# Patient Record
Sex: Female | Born: 1995 | Race: Black or African American | Hispanic: No | Marital: Single | Smoking: Current some day smoker
Health system: Southern US, Community
[De-identification: ages and names within clinical notes are randomized; demographics above are authoritative.]

## PROBLEM LIST (undated history)

## (undated) ENCOUNTER — Inpatient Hospital Stay (HOSPITAL_COMMUNITY): Payer: Self-pay

## (undated) DIAGNOSIS — F32A Depression, unspecified: Secondary | ICD-10-CM

## (undated) DIAGNOSIS — E669 Obesity, unspecified: Secondary | ICD-10-CM

## (undated) DIAGNOSIS — I10 Essential (primary) hypertension: Secondary | ICD-10-CM

## (undated) DIAGNOSIS — Z8719 Personal history of other diseases of the digestive system: Secondary | ICD-10-CM

## (undated) DIAGNOSIS — J45909 Unspecified asthma, uncomplicated: Secondary | ICD-10-CM

## (undated) DIAGNOSIS — Z9889 Other specified postprocedural states: Secondary | ICD-10-CM

## (undated) DIAGNOSIS — K429 Umbilical hernia without obstruction or gangrene: Secondary | ICD-10-CM

## (undated) DIAGNOSIS — F329 Major depressive disorder, single episode, unspecified: Secondary | ICD-10-CM

## (undated) DIAGNOSIS — A539 Syphilis, unspecified: Secondary | ICD-10-CM

## (undated) HISTORY — DX: Unspecified asthma, uncomplicated: J45.909

## (undated) HISTORY — PX: UMBILICAL HERNIA REPAIR: SHX196

## (undated) HISTORY — DX: Personal history of other diseases of the digestive system: Z87.19

## (undated) HISTORY — PX: UMBILICAL HERNIA REPAIR: SUR1181

## (undated) HISTORY — DX: Other specified postprocedural states: Z98.890

---
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
## 2013-01-03 ENCOUNTER — Other Ambulatory Visit: Payer: Self-pay

## 2013-11-06 ENCOUNTER — Encounter (HOSPITAL_COMMUNITY): Payer: Self-pay | Admitting: Emergency Medicine

## 2013-11-06 ENCOUNTER — Emergency Department (HOSPITAL_COMMUNITY)
Admission: EM | Admit: 2013-11-06 | Discharge: 2013-11-06 | Disposition: A | Discharge status: Home / Self Care | Payer: Self-pay | Attending: Emergency Medicine | Admitting: Emergency Medicine

## 2013-11-06 DIAGNOSIS — Z113 Encounter for screening for infections with a predominantly sexual mode of transmission: Secondary | ICD-10-CM | POA: Insufficient documentation

## 2013-11-06 DIAGNOSIS — I10 Essential (primary) hypertension: Secondary | ICD-10-CM | POA: Insufficient documentation

## 2013-11-06 DIAGNOSIS — F17200 Nicotine dependence, unspecified, uncomplicated: Secondary | ICD-10-CM | POA: Insufficient documentation

## 2013-11-06 DIAGNOSIS — Z8719 Personal history of other diseases of the digestive system: Secondary | ICD-10-CM | POA: Insufficient documentation

## 2013-11-06 DIAGNOSIS — Z3202 Encounter for pregnancy test, result negative: Secondary | ICD-10-CM | POA: Insufficient documentation

## 2013-11-06 DIAGNOSIS — J45909 Unspecified asthma, uncomplicated: Secondary | ICD-10-CM | POA: Insufficient documentation

## 2013-11-06 LAB — POC URINE PREG, ED: Preg Test, Ur: NEGATIVE

## 2013-11-06 NOTE — ED Notes (Signed)
Pt here requesting STD check and pregnancy test; pt sts LMP was in August and had some spotting several days ago; pt denies obvious complaint

## 2013-11-06 NOTE — ED Provider Notes (Signed)
CSN: [id number]     Arrival date & time 11/06/13  1020 History   First MD Initiated Contact with Patient 11/06/13 1200     Chief Complaint  Patient presents with  . SEXUALLY TRANSMITTED DISEASE     (Consider location/radiation/quality/duration/timing/severity/associated sxs/prior Treatment) HPI  Patient to the ER for STD check and pregnancy test. She is here with her mother and reports having sexual intercourse last night between 10-11 pm. She denies having any symptoms or pain. She then changes her mind when told she would need a pelvic to get STD check and decides that she would just like a pregnancy test done. Asymptomatic  Past Medical History  Diagnosis Date  . Hypertension     on meds  . Hernia, umbilical   . Hypertension   . H/O umbilical hernia repair   . Asthma     no current meds needed   Past Surgical History  Procedure Laterality Date  . Umbilical hernia repair      as a child  . Umbilical hernia repair    . Cesarean section N/A 05/19/2012    Procedure:  Primary CESAREAN SECTION  of baby girl  at 1933  APGAR 4/5/5;  Surgeon: Adam Phenix, MD;  Location: [facility];  Service: Obstetrics;  Laterality: N/A;   Family History  Problem Relation Age of Onset  . Hypertension Mother   . Anemia Mother   . Depression Mother   . Deep vein thrombosis Maternal Grandmother   . Diabetes Maternal Grandmother   . Kidney disease Maternal Grandmother   . Cancer Maternal Grandmother     colon cancer   History  Substance Use Topics  . Smoking status: Current Every Day Smoker -- 0.25 packs/day    Types: Cigarettes  . Smokeless tobacco: Never Used  . Alcohol Use: Yes   OB History   Grav Para Term Preterm Abortions TAB SAB Ect Mult Living   Review of Systems  Review of Systems  Gen: no weight loss, fevers, chills, night sweats  Eyes: no occular draining, occular pain,  No visual changes  Nose: no epistaxis or rhinorrhea  Mouth: no dental pain, no sore  throat  Neck: no neck pain  Lungs: No hemoptysis. No wheezing or coughing CV:  No palpitations, dependent edema or orthopnea. No chest pain Abd: no diarrhea. No nausea or vomiting, No abdominal pain  GU: no dysuria or gross hematuria  MSK:  No muscle weakness, No muscular pain Neuro: no headache, no focal neurologic deficits  Skin: no rash , no wounds Psyche: no complaints of depression or anxiety     Allergies  Banana; Lactose intolerance (gi); and Sulfa antibiotics  Home Medications   Prior to Admission medications   Not on File   BP 140/88  Pulse 72  Temp(Src) 98.4 F (36.9 C) (Oral)  Resp 18  SpO2 97% Physical Exam  Nursing note and vitals reviewed. Constitutional: She appears well-developed and well-nourished. No distress.  HENT:  Head: Normocephalic and atraumatic.  Eyes: Pupils are equal, round, and reactive to light.  Neck: Normal range of motion. Neck supple.  Cardiovascular: Normal rate and regular rhythm.   Pulmonary/Chest: Effort normal.  Abdominal: Soft.  Neurological: She is alert.  Skin: Skin is warm and dry.    ED Course  Procedures (including critical care time) Labs Review Labs Reviewed  POC URINE PREG, ED    Imaging Review No results  found.   EKG Interpretation None      MDM   Final diagnoses:  Pregnancy test negative    Negative pregnancy test. She can follow-up with her PCP Dr. Marice Potter for STD check.  18 y.o.Judy Wood's evaluation in the Emergency Department is complete. It has been determined that no acute conditions requiring further emergency intervention are present at this time. The patient/guardian have been advised of the diagnosis and plan. We have discussed signs and symptoms that warrant return to the ED, such as changes or worsening in symptoms.  Vital signs are stable at discharge. Filed Vitals:   11/06/13 1301  BP: 140/88  Pulse: 72  Temp:   Resp: 18    Patient/guardian has voiced understanding and agreed  to follow-up with the PCP or specialist.     Dorthula Matas, PA-C 11/06/13 1306

## 2013-11-06 NOTE — ED Notes (Signed)
Pt refusing to be placed on monitor or get in gown.

## 2013-11-06 NOTE — ED Notes (Signed)
Brought pt to room with family in tow; [place], RN present in room

## 2013-11-06 NOTE — Discharge Instructions (Signed)
Pregnancy Tests °HOW DO PREGNANCY TESTS WORK? °All pregnancy tests look for a special hormone in the urine or blood that is only present in pregnant women. This hormone, human chorionic gonadotropin (hCG), is also called the pregnancy hormone.  °WHAT IS THE DIFFERENCE BETWEEN A URINE AND A BLOOD PREGNANCY TEST? IS ONE BETTER THAN THE OTHER? °There are two types of pregnancy tests. °· Blood tests. °· Urine tests. °Both tests look for the presence of hCG, the pregnancy hormone. Many women use a urine test or home pregnancy test (HPT) to find out if they are pregnant. HPTs are cheap, easy to use, can be done at home, and are private. When a woman has a positive result on an HPT, she needs to see her caregiver right away. The caregiver can confirm a positive HPT result with another urine test, a blood test, ultrasound, and a pelvic exam.  °There are two types of blood tests you can get from a caregiver.  °· A quantitative blood test (or the beta hCG test). This test measures the exact amount of hCG in the blood. This means it can pick up very small amounts of hCG, making it a very accurate test. °· A qualitative hCG blood test. This test gives a simple yes or no answer to whether you are pregnant. This test is more like a urine test in terms of its accuracy. °Blood tests can pick up hCG earlier in a pregnancy than urine tests can. Blood tests can tell if you are pregnant about 6 to 8 days after you release an egg from an ovary (ovulate). Urine tests can determine pregnancy about 2 weeks after ovulation.  °HOW IS A HOME PREGNANCY TEST DONE?  °There are many types of home pregnancy tests or HPTs that can be bought over-the-counter at drug or discount stores.  °· Some involve collecting your urine in a cup and dipping a stick into the urine or putting some of the urine into a special container with an eyedropper. °· Others are done by placing a stick into your urine stream. °· Tests vary in how long you need to wait for  the stick or container to turn a certain color or have a symbol on it (like a plus or a minus). °· All tests come with written instructions. Most tests also have toll-free phone numbers to call if you have any questions about how to do the test or read the results. °HOW ACCURATE ARE HOME PREGNANCY TESTS?  °HPTs are very accurate. Most brands of HPTs say they are 97% to 99% accurate when taken 1 week after missing your menstrual period, but this can vary with actual use. Each brand varies in how sensitive it is in picking up the pregnancy hormone hCG. If a test is not done correctly, it will be less accurate. Always check the package to make sure it is not past its expiration date. If it is, it will not be accurate. Most brands of HPTs tell users to do the test again in a few days, no matter what the results.  °If you use an HPT too early in your pregnancy, you may not have enough of the pregnancy hormone hCG in your urine to have a positive test result. Most HPTs will be accurate if you test yourself around the time your period is due (about 2 weeks after you ovulate). You can get a negative test result if you are not pregnant or if you ovulated later than you thought you did.   You may also have problems with the pregnancy, which affects the amount of hCG you have in your urine. If your HPT is negative, test yourself again within a few days to 1 week. If you keep getting a negative result and think you are pregnant, talk with your caregiver right away about getting a blood pregnancy test.  °FALSE POSITIVE PREGNANCY TEST °A false positive HPT can happen if there is blood or protein present in your urine. A false positive can also happen if you were recently pregnant or if you take a pregnancy test too soon after taking fertility drug that contains hCG. Also, some prescription medicines such as water pills (diuretics), tranquilizers, seizure medicines, psychiatric medicines, and allergy and nausea medicines  (promethazine) give false positive readings. °FALSE NEGATIVE PREGNANCY TEST °· A false negative HPT can happen if you do the test too early. Try to wait until you are at least 1 day late for your menstrual period. °· It may happen if you wait too long to test the urine (longer than 15 minutes). °· It may also happen if the urine is too diluted because you drank a lot of fluids before getting the urine sample. It is best to test the first morning urine after you get out of bed. °If your menstrual period did not start after a week of a negative HPT, repeat the pregnancy test. °CAN ANYTHING INTERFERE WITH HOME PREGNANCY TEST RESULTS?  °Most medicines, both over-the-counter and prescription drugs, including birth control pills and antibiotics, should not affect the results of a HPT. Only those drugs that have the pregnancy hormone hCG in them can give a false positive test result. Drugs that have hCG in them may be used for treating infertility (not being able to get pregnant). Alcohol and illegal drugs do not affect HPT results, but you should not be using these substances if you are trying to get pregnant. If you have a positive pregnancy test, call your caregiver to make an appointment to begin prenatal care. °Document Released: 02/17/2003 Document Revised: 05/09/2011 Document Reviewed: 05/31/2013 °ExitCare® Patient Information ©2015 [employer]. This information is not intended to replace advice given to you by your health care provider. Make sure you discuss any questions you have with your health care provider. ° °

## 2013-11-07 NOTE — ED Provider Notes (Signed)
Medical screening examination/treatment/procedure(s) were performed by non-physician practitioner and as supervising physician I was immediately available for consultation/collaboration.   EKG Interpretation None        Laketta Soderberg, MD 11/07/13 0737 

## 2013-12-21 ENCOUNTER — Encounter (HOSPITAL_COMMUNITY): Payer: Self-pay | Admitting: Emergency Medicine

## 2013-12-21 ENCOUNTER — Emergency Department (HOSPITAL_COMMUNITY)
Admission: EM | Admit: 2013-12-21 | Discharge: 2013-12-21 | Disposition: A | Discharge status: Home / Self Care | Payer: Self-pay | Attending: Emergency Medicine | Admitting: Emergency Medicine

## 2013-12-21 DIAGNOSIS — J45909 Unspecified asthma, uncomplicated: Secondary | ICD-10-CM | POA: Insufficient documentation

## 2013-12-21 DIAGNOSIS — Z3202 Encounter for pregnancy test, result negative: Secondary | ICD-10-CM | POA: Insufficient documentation

## 2013-12-21 DIAGNOSIS — I10 Essential (primary) hypertension: Secondary | ICD-10-CM | POA: Insufficient documentation

## 2013-12-21 DIAGNOSIS — N760 Acute vaginitis: Secondary | ICD-10-CM | POA: Insufficient documentation

## 2013-12-21 DIAGNOSIS — Z720 Tobacco use: Secondary | ICD-10-CM | POA: Insufficient documentation

## 2013-12-21 DIAGNOSIS — Z8719 Personal history of other diseases of the digestive system: Secondary | ICD-10-CM | POA: Insufficient documentation

## 2013-12-21 DIAGNOSIS — B9689 Other specified bacterial agents as the cause of diseases classified elsewhere: Secondary | ICD-10-CM

## 2013-12-21 LAB — WET PREP, GENITAL
Trich, Wet Prep: NONE SEEN
YEAST WET PREP: NONE SEEN

## 2013-12-21 LAB — URINALYSIS, ROUTINE W REFLEX MICROSCOPIC
Bilirubin Urine: NEGATIVE
GLUCOSE, UA: NEGATIVE mg/dL
HGB URINE DIPSTICK: NEGATIVE
Ketones, ur: NEGATIVE mg/dL
LEUKOCYTES UA: NEGATIVE
Nitrite: NEGATIVE
Protein, ur: NEGATIVE mg/dL
SPECIFIC GRAVITY, URINE: 1.024 (ref 1.005–1.030)
UROBILINOGEN UA: 1 mg/dL (ref 0.0–1.0)
pH: 7.5 (ref 5.0–8.0)

## 2013-12-21 LAB — PREGNANCY, URINE: PREG TEST UR: NEGATIVE

## 2013-12-21 LAB — HIV ANTIBODY (ROUTINE TESTING W REFLEX): HIV 1&2 Ab, 4th Generation: NONREACTIVE

## 2013-12-21 LAB — RPR

## 2013-12-21 MED ORDER — METRONIDAZOLE 500 MG PO TABS: 500.0000 mg | ORAL_TABLET | Freq: Two times a day (BID) | ORAL | Status: DC

## 2013-12-21 MED ORDER — CEFTRIAXONE SODIUM 250 MG IJ SOLR
250.0000 mg | Freq: Once | INTRAMUSCULAR | Status: AC
  Administered 2013-12-21: 250 mg via INTRAMUSCULAR
  Filled 2013-12-21: qty 250

## 2013-12-21 MED ORDER — AZITHROMYCIN 1 G PO PACK
1.0000 g | PACK | Freq: Once | ORAL | Status: AC
  Administered 2013-12-21: 1 g via ORAL
  Filled 2013-12-21: qty 1

## 2013-12-21 MED ORDER — LIDOCAINE HCL 1 % IJ SOLN
INTRAMUSCULAR | Status: AC
  Administered 2013-12-21: 1.9 mL
  Filled 2013-12-21: qty 20

## 2013-12-21 NOTE — ED Notes (Signed)
Will go ahead and set up pelvic cart.

## 2013-12-21 NOTE — ED Notes (Signed)
Pt reports not that she took a home pregnancy test that was positive and the next day she had "bad" vaginal bleeding that lasted three days. She denies bleeding at this time.

## 2013-12-21 NOTE — Discharge Instructions (Signed)
Bacterial Vaginosis Bacterial vaginosis is a vaginal infection that occurs when the normal balance of bacteria in the vagina is disrupted. It results from an overgrowth of certain bacteria. This is the most common vaginal infection in women of childbearing age. Treatment is important to prevent complications, especially in pregnant women, as it can cause a premature delivery. CAUSES  Bacterial vaginosis is caused by an increase in harmful bacteria that are normally present in smaller amounts in the vagina. Several different kinds of bacteria can cause bacterial vaginosis. However, the reason that the condition develops is not fully understood. RISK FACTORS Certain activities or behaviors can put you at an increased risk of developing bacterial vaginosis, including:  Having a new sex partner or multiple sex partners.  Douching.  Using an intrauterine device (IUD) for contraception. Women do not get bacterial vaginosis from toilet seats, bedding, swimming pools, or contact with objects around them. SIGNS AND SYMPTOMS  Some women with bacterial vaginosis have no signs or symptoms. Common symptoms include:  Grey vaginal discharge.  A fishlike odor with discharge, especially after sexual intercourse.  Itching or burning of the vagina and vulva.  Burning or pain with urination. DIAGNOSIS  Your health care provider will take a medical history and examine the vagina for signs of bacterial vaginosis. A sample of vaginal fluid may be taken. Your health care provider will look at this sample under a microscope to check for bacteria and abnormal cells. A vaginal pH test may also be done.  TREATMENT  Bacterial vaginosis may be treated with antibiotic medicines. These may be given in the form of a pill or a vaginal cream. A second round of antibiotics may be prescribed if the condition comes back after treatment.  HOME CARE INSTRUCTIONS   Only take over-the-counter or prescription medicines as  directed by your health care provider.  If antibiotic medicine was prescribed, take it as directed. Make sure you finish it even if you start to feel better.  Do not have sex until treatment is completed.  Tell all sexual partners that you have a vaginal infection. They should see their health care provider and be treated if they have problems, such as a mild rash or itching.  Practice safe sex by using condoms and only having one sex partner. SEEK MEDICAL CARE IF:   Your symptoms are not improving after 3 days of treatment.  You have increased discharge or pain.  You have a fever. MAKE SURE YOU:   Understand these instructions.  Will watch your condition.  Will get help right away if you are not doing well or get worse. FOR MORE INFORMATION  Centers for Disease Control and Prevention, Division of STD Prevention: www.cdc.gov/std American Sexual Health Association (ASHA): www.ashastd.org  Document Released: 02/14/2005 Document Revised: 12/05/2012 Document Reviewed: 09/26/2012 ExitCare Patient Information 2015 [employer]. This information is not intended to replace advice given to you by your health care provider. Make sure you discuss any questions you have with your health care provider.  

## 2013-12-21 NOTE — ED Notes (Addendum)
Pt is reporting central and left sided abdominal pain.She also c/o urinary frequency but denies burning. She also denies nausea, vomiting, or diarrhea. She is laughing and talking to visitor upon RN assessment.

## 2013-12-21 NOTE — ED Provider Notes (Signed)
CSN: [id number]     Arrival date & time 12/21/13  0808 History   First MD Initiated Contact with Patient 12/21/13 0913     Chief Complaint  Patient presents with  . Vaginal Discharge     (Consider location/radiation/quality/duration/timing/severity/associated sxs/prior Treatment) HPI Comments: Patient presents with vaginal discharge. She states she noticed this morning she denies any itching or burning. She denies any urinary symptoms. She denies abdominal pain, fevers, nausea or vomiting. She denies a history of STDs. She states her periods have been irregular recently and she had one day of bleeding about 2 weeks ago.  Patient is a 18 y.o. female presenting with vaginal discharge.  Vaginal Discharge Associated symptoms: no abdominal pain, no fever, no nausea and no vomiting     Past Medical History  Diagnosis Date  . Hypertension     on meds  . Hernia, umbilical   . Hypertension   . H/O umbilical hernia repair   . Asthma     no current meds needed   Past Surgical History  Procedure Laterality Date  . Umbilical hernia repair      as a child  . Umbilical hernia repair    . Cesarean section N/A 05/19/2012    Procedure:  Primary CESAREAN SECTION  of baby girl  at 1933  APGAR 4/5/5;  Surgeon: Adam PhenixJames G Arnold, MD;  Location: [facility];  Service: Obstetrics;  Laterality: N/A;   Family History  Problem Relation Age of Onset  . Hypertension Mother   . Anemia Mother   . Depression Mother   . Deep vein thrombosis Maternal Grandmother   . Diabetes Maternal Grandmother   . Kidney disease Maternal Grandmother   . Cancer Maternal Grandmother     colon cancer   History  Substance Use Topics  . Smoking status: Current Every Day Smoker -- 0.25 packs/day    Types: Cigarettes  . Smokeless tobacco: Never Used  . Alcohol Use: Yes   OB History   Grav Para Term Preterm Abortions TAB SAB Ect Mult Living   1 1 1       1      Review of Systems  Constitutional: Negative for fever, chills,  diaphoresis and fatigue.  HENT: Negative for congestion, rhinorrhea and sneezing.   Eyes: Negative.   Respiratory: Negative for cough, chest tightness and shortness of breath.   Cardiovascular: Negative for chest pain and leg swelling.  Gastrointestinal: Negative for nausea, vomiting, abdominal pain, diarrhea and blood in stool.  Genitourinary: Positive for vaginal discharge. Negative for frequency, hematuria, flank pain, vaginal bleeding and difficulty urinating.  Musculoskeletal: Negative for arthralgias and back pain.  Skin: Negative for rash.  Neurological: Negative for dizziness, speech difficulty, weakness, numbness and headaches.      Allergies  Banana; Lactose intolerance (gi); and Sulfa antibiotics  Home Medications   Prior to Admission medications   Medication Sig Start Date End Date Taking? Authorizing Provider  metroNIDAZOLE (FLAGYL) 500 MG tablet Take 1 tablet (500 mg total) by mouth 2 (two) times daily. One po bid x 7 days 12/21/13   Rolan BuccoMelanie Revonda Menter, MD   BP 150/97  Pulse 84  Temp(Src) 98.9 F (37.2 C) (Oral)  Resp 16  SpO2 97%  LMP 12/10/2013 Physical Exam  Constitutional: She is oriented to person, place, and time. She appears well-developed and well-nourished.  HENT:  Head: Normocephalic and atraumatic.  Eyes: Pupils are equal, round, and reactive to light.  Neck: Normal range of motion. Neck supple.  Cardiovascular: Normal  rate, regular rhythm and normal heart sounds.   Pulmonary/Chest: Effort normal and breath sounds normal. No respiratory distress. She has no wheezes. She has no rales. She exhibits no tenderness.  Abdominal: Soft. Bowel sounds are normal. There is no tenderness. There is no rebound and no guarding.  Genitourinary: Vaginal discharge (moderate amount of thin white discharge) found.  No cervical motion tenderness or adnexal tenderness  Musculoskeletal: Normal range of motion. She exhibits no edema.  Lymphadenopathy:    She has no cervical  adenopathy.  Neurological: She is alert and oriented to person, place, and time.  Skin: Skin is warm and dry. No rash noted.  Psychiatric: She has a normal mood and affect.    ED Course  Procedures (including critical care time) Labs Review Labs Reviewed  WET PREP, GENITAL - Abnormal; Notable for the following:    Clue Cells Wet Prep HPF POC FEW (*)    WBC, Wet Prep HPF POC FEW (*)    All other components within normal limits  URINALYSIS, ROUTINE W REFLEX MICROSCOPIC - Abnormal; Notable for the following:    Color, Urine AMBER (*)    All other components within normal limits  GC/CHLAMYDIA PROBE AMP  PREGNANCY, URINE  HIV ANTIBODY (ROUTINE TESTING)  RPR  POC URINE PREG, ED    Imaging Review No results found.   EKG Interpretation None      MDM   Final diagnoses:  BV (bacterial vaginosis)    Patient was treated with Rocephin and Zithromax for potential STDs. She was also given a prescription for Flagyl for bacterial vaginosis. She is given [facility] if her symptoms are not improving.    Rolan BuccoMelanie Brysyn Brandenberger, MD 12/21/13 [phone]

## 2013-12-21 NOTE — ED Notes (Signed)
She states she noted a vaginal d/c which "smells funny" this morning.  She also recently found out her boyfriend has urethritis s/s.  She denies fever, nor any other sign of current illness.

## 2013-12-23 LAB — GC/CHLAMYDIA PROBE AMP
CT Probe RNA: NEGATIVE
GC Probe RNA: POSITIVE — AB

## 2013-12-24 ENCOUNTER — Telehealth (HOSPITAL_COMMUNITY): Payer: Self-pay

## 2013-12-30 ENCOUNTER — Encounter (HOSPITAL_COMMUNITY): Payer: Self-pay | Admitting: Emergency Medicine

## 2014-02-28 NOTE — L&D Delivery Note (Signed)
Delivery Note At 2:31 AM a viable female was delivered via Vaginal, Spontaneous Delivery (Presentation:vertex ; LOA ).  APGAR: 9, 9; weight  .   Placenta status: Intact, Pathology Spontaneous.  Cord:3vc  with the following complications:none  Cord pH: n/a  Anesthesia: Epidural  Episiotomy: None Lacerations: 1st degree;Labial Suture Repair: 2.0 vicryl Est. Blood Loss50 (mL):    Mom to postpartum.  Baby to Couplet care / Skin to Skin.  Judy Wood DARLENE 10/13/2014, 2:46 AM

## 2014-03-08 ENCOUNTER — Encounter (HOSPITAL_COMMUNITY): Payer: Self-pay | Admitting: *Deleted

## 2014-03-08 ENCOUNTER — Inpatient Hospital Stay (HOSPITAL_COMMUNITY)
Admission: AD | Admit: 2014-03-08 | Discharge: 2014-03-09 | Disposition: A | Discharge status: Home / Self Care | Payer: Medicaid Other | Source: Ambulatory Visit | Attending: Family Medicine | Admitting: Family Medicine

## 2014-03-08 ENCOUNTER — Inpatient Hospital Stay (HOSPITAL_COMMUNITY): Payer: Medicaid Other

## 2014-03-08 DIAGNOSIS — O9989 Other specified diseases and conditions complicating pregnancy, childbirth and the puerperium: Secondary | ICD-10-CM

## 2014-03-08 DIAGNOSIS — O99891 Other specified diseases and conditions complicating pregnancy: Secondary | ICD-10-CM

## 2014-03-08 DIAGNOSIS — Z3A09 9 weeks gestation of pregnancy: Secondary | ICD-10-CM | POA: Insufficient documentation

## 2014-03-08 DIAGNOSIS — M545 Low back pain: Secondary | ICD-10-CM | POA: Insufficient documentation

## 2014-03-08 DIAGNOSIS — O10011 Pre-existing essential hypertension complicating pregnancy, first trimester: Secondary | ICD-10-CM | POA: Insufficient documentation

## 2014-03-08 DIAGNOSIS — O10911 Unspecified pre-existing hypertension complicating pregnancy, first trimester: Secondary | ICD-10-CM

## 2014-03-08 DIAGNOSIS — F17210 Nicotine dependence, cigarettes, uncomplicated: Secondary | ICD-10-CM | POA: Insufficient documentation

## 2014-03-08 DIAGNOSIS — O99331 Smoking (tobacco) complicating pregnancy, first trimester: Secondary | ICD-10-CM | POA: Insufficient documentation

## 2014-03-08 DIAGNOSIS — M549 Dorsalgia, unspecified: Secondary | ICD-10-CM

## 2014-03-08 LAB — URINALYSIS, ROUTINE W REFLEX MICROSCOPIC
BILIRUBIN URINE: NEGATIVE
Glucose, UA: NEGATIVE mg/dL
HGB URINE DIPSTICK: NEGATIVE
KETONES UR: NEGATIVE mg/dL
LEUKOCYTES UA: NEGATIVE
Nitrite: NEGATIVE
PH: 6 (ref 5.0–8.0)
Protein, ur: NEGATIVE mg/dL
Specific Gravity, Urine: 1.025 (ref 1.005–1.030)
Urobilinogen, UA: 0.2 mg/dL (ref 0.0–1.0)

## 2014-03-08 LAB — CBC
HCT: 38.4 % (ref 36.0–46.0)
Hemoglobin: 12.7 g/dL (ref 12.0–15.0)
MCH: 28.9 pg (ref 26.0–34.0)
MCHC: 33.1 g/dL (ref 30.0–36.0)
MCV: 87.5 fL (ref 78.0–100.0)
Platelets: 314 10*3/uL (ref 150–400)
RBC: 4.39 MIL/uL (ref 3.87–5.11)
RDW: 13.1 % (ref 11.5–15.5)
WBC: 8.7 10*3/uL (ref 4.0–10.5)

## 2014-03-08 LAB — HCG, QUANTITATIVE, PREGNANCY: hCG, Beta Chain, Quant, S: 21545 m[IU]/mL — ABNORMAL HIGH (ref <5)

## 2014-03-08 LAB — POCT PREGNANCY, URINE: Preg Test, Ur: POSITIVE — AB

## 2014-03-08 MED ORDER — LABETALOL HCL 100 MG PO TABS
200.0000 mg | ORAL_TABLET | Freq: Once | ORAL | Status: AC
  Administered 2014-03-08: 200 mg via ORAL
  Filled 2014-03-08: qty 2

## 2014-03-08 MED ORDER — LABETALOL HCL 100 MG PO TABS: 400.0000 mg | ORAL_TABLET | Freq: Once | ORAL | Status: DC

## 2014-03-08 NOTE — MAU Note (Signed)
I have high B/P my BP was 177/113 at home this am. I am not  On medication as it has been ok lately. I am early pregnant but don't know how far. LMP 01/04/14. Having lower back pain and slight h/a

## 2014-03-08 NOTE — MAU Provider Note (Signed)
Chief Complaint: Hypertension   First Provider Initiated Contact with Patient 03/08/14 2226     SUBJECTIVE HPI: Judy Wood is a 19 y.o. G2P1001 at 632w0d by LMP who presents to maternity admissions reporting back and abdominal pain and high blood pressure with positive HPT.  She had HTN with previous pregnancy and had pressure of 170s/100s at home today.  She denies vaginal bleeding, vaginal itching/burning, urinary symptoms, h/a, dizziness, n/v, or fever/chills.    Past Medical History  Diagnosis Date  . Hypertension     on meds  . Hernia, umbilical   . Hypertension   . H/O umbilical hernia repair   . Asthma     no current meds needed   Past Surgical History  Procedure Laterality Date  . Umbilical hernia repair      as a child  . Umbilical hernia repair    . Cesarean section N/A 05/19/2012    Procedure:  Primary CESAREAN SECTION  of baby girl  at 1933  APGAR 4/5/5;  Surgeon: Adam PhenixJames G Arnold, MD;  Location: [facility];  Service: Obstetrics;  Laterality: N/A;   History   Social History  . Marital Status: Single    Spouse Name: N/A    Number of Children: N/A  . Years of Education: N/A   Occupational History  . Not on file.   Social History Main Topics  . Smoking status: Light Tobacco Smoker -- 0.25 packs/day    Types: Cigarettes  . Smokeless tobacco: Never Used  . Alcohol Use: Yes  . Drug Use: Yes    Special: Marijuana  . Sexual Activity: Yes    Birth Control/ Protection: None   Other Topics Concern  . Not on file   Social History Narrative   No current facility-administered medications on file prior to encounter.   No current outpatient prescriptions on file prior to encounter.   Allergies  Allergen Reactions  . Banana Anaphylaxis  . Lactose Intolerance (Gi) Diarrhea and Nausea Only  . Sulfa Antibiotics Other (See Comments)    Childhood reaction    ROS: Pertinent items in HPI  OBJECTIVE Blood pressure 135/72, pulse 68, temperature 98.2 F (36.8 C), resp.  rate 18, height 5\' 5"  (1.651 m), weight 118.117 kg (260 lb 6.4 oz), last menstrual period 01/04/2014, SpO2 100 %.  Patient Vitals for the past 24 hrs:  BP Temp Pulse Resp SpO2 Height Weight  03/09/14 0117 135/72 mmHg - 68 18 - - -  03/09/14 0010 137/70 mmHg - 64 - - - -  03/08/14 2248 117/93 mmHg - 67 - - - -  03/08/14 2234 154/94 mmHg - 84 - - - -  03/08/14 2219 134/57 mmHg - 102 - - - -  03/08/14 2203 (!) 126/105 mmHg - 84 - - - -  03/08/14 2148 161/86 mmHg - 70 - - - -  03/08/14 2143 145/100 mmHg - 82 - - - -  03/08/14 2042 (!) 171/104 mmHg - - - 100 % - -  03/08/14 2039 (!) 159/111 mmHg 98.2 F (36.8 C) 75 18 - 5\' 5"  (1.651 m) 118.117 kg (260 lb 6.4 oz)   GENERAL: Well-developed, well-nourished female in no acute distress.  HEENT: Normocephalic HEART: normal rate RESP: normal effort ABDOMEN: Soft, non-tender EXTREMITIES: Nontender, no edema NEURO: Alert and oriented Pelvic exam: Cervix pink, visually closed, without lesion, scant white creamy discharge, vaginal walls and external genitalia normal Bimanual exam: Cervix 0/long/high, firm, anterior, neg CMT, uterus nontender, nonenlarged, adnexa without tenderness, enlargement,  or mass  LAB RESULTS Results for orders placed or performed during the hospital encounter of 03/08/14 (from the past 24 hour(s))  Urinalysis, Routine w reflex microscopic     Status: None   Collection Time: 03/08/14  8:45 PM  Result Value Ref Range   Color, Urine YELLOW YELLOW   APPearance CLEAR CLEAR   Specific Gravity, Urine 1.025 1.005 - 1.030   pH 6.0 5.0 - 8.0   Glucose, UA NEGATIVE NEGATIVE mg/dL   Hgb urine dipstick NEGATIVE NEGATIVE   Bilirubin Urine NEGATIVE NEGATIVE   Ketones, ur NEGATIVE NEGATIVE mg/dL   Protein, ur NEGATIVE NEGATIVE mg/dL   Urobilinogen, UA 0.2 0.0 - 1.0 mg/dL   Nitrite NEGATIVE NEGATIVE   Leukocytes, UA NEGATIVE NEGATIVE  Pregnancy, urine POC     Status: Abnormal   Collection Time: 03/08/14  8:50 PM  Result Value Ref  Range   Preg Test, Ur POSITIVE (A) NEGATIVE  CBC     Status: None   Collection Time: 03/08/14 10:25 PM  Result Value Ref Range   WBC 8.7 4.0 - 10.5 K/uL   RBC 4.39 3.87 - 5.11 MIL/uL   Hemoglobin 12.7 12.0 - 15.0 g/dL   HCT 56.2 13.0 - 86.5 %   MCV 87.5 78.0 - 100.0 fL   MCH 28.9 26.0 - 34.0 pg   MCHC 33.1 30.0 - 36.0 g/dL   RDW 78.4 69.6 - 29.5 %   Platelets 314 150 - 400 K/uL  hCG, quantitative, pregnancy     Status: Abnormal   Collection Time: 03/08/14 10:25 PM  Result Value Ref Range   hCG, Beta Chain, Quant, S 28413 (H) <5 mIU/mL    IMAGING US Ob Comp Less 14 Wks  03/08/2014   CLINICAL DATA:  Low back pain since December. Estimated gestational age by last menstrual period equals 9 weeks 0 days.  EXAM: OBSTETRIC <14 WK [place] AND TRANSVAGINAL OB US  TECHNIQUE: Both transabdominal and transvaginal ultrasound examinations were performed for complete evaluation of the gestation as well as the maternal uterus, adnexal regions, and pelvic cul-de-sac. Transvaginal technique was performed to assess early pregnancy.  COMPARISON:  None.  FINDINGS: Intrauterine gestational sac: Single  Yolk sac:  Present  Embryo:  Present  Cardiac Activity: Present  Heart Rate:  103 bpm  CRL:   2.4  mm   5 w 6 d                  [place] EDC: 11/02/2014  Maternal uterus/adnexae: Normal uterus and ovaries.  No free fluid.  IMPRESSION: 1. Single intrauterine gestation with embryo and early cardiac activity.  2. Estimated gestational age by crown rump length equals 5 weeks 6 days.   Electronically Signed   By: Genevive Bi M.D.   On: 03/08/2014 23:45   US Ob Transvaginal  03/08/2014   CLINICAL DATA:  Low back pain since December. Estimated gestational age by last menstrual period equals 9 weeks 0 days.  EXAM: OBSTETRIC <14 WK [place] AND TRANSVAGINAL OB US  TECHNIQUE: Both transabdominal and transvaginal ultrasound examinations were performed for complete evaluation of the gestation as well as the maternal uterus, adnexal  regions, and pelvic cul-de-sac. Transvaginal technique was performed to assess early pregnancy.  COMPARISON:  None.  FINDINGS: Intrauterine gestational sac: Single  Yolk sac:  Present  Embryo:  Present  Cardiac Activity: Present  Heart Rate:  103 bpm  CRL:   2.4  mm   5 w 6 d  [place] EDC: 11/02/2014  Maternal uterus/adnexae: Normal uterus and ovaries.  No free fluid.  IMPRESSION: 1. Single intrauterine gestation with embryo and early cardiac activity.  2. Estimated gestational age by crown rump length equals 5 weeks 6 days.   Electronically Signed   By: Genevive Bi M.D.   On: 03/08/2014 23:45    ASSESSMENT 1. Chronic hypertension in pregnancy, first trimester   2. Back pain affecting pregnancy in first trimester     PLAN Discharge home Labetalol 200 mg PO BID, first dose given in MAU F/U in [facility], message sent to establish care Teaching done about back pain in pregnancy with remedies including exercise, heat, ice, warm bath, and Tylenol  Follow-up Information    Please follow up.   Why:  With early prenatal care      Follow up with [facility] [place] [facility] ADMISS[facility].   Why:  As needed for emergencies   Contact information:   [address] [id number] mc [place] [address] [phone]      Sharen Counter Certified Nurse-Midwife 03/09/2014  3:22 AM

## 2014-03-09 ENCOUNTER — Encounter (HOSPITAL_COMMUNITY): Payer: Self-pay | Admitting: Advanced Practice Midwife

## 2014-03-09 DIAGNOSIS — M549 Dorsalgia, unspecified: Secondary | ICD-10-CM

## 2014-03-09 DIAGNOSIS — O26891 Other specified pregnancy related conditions, first trimester: Secondary | ICD-10-CM

## 2014-03-09 MED ORDER — LABETALOL HCL 100 MG PO TABS: 200.0000 mg | ORAL_TABLET | Freq: Two times a day (BID) | ORAL | Status: DC

## 2014-03-09 NOTE — Discharge Instructions (Signed)

## 2014-03-10 ENCOUNTER — Encounter: Payer: Self-pay | Admitting: Obstetrics & Gynecology

## 2014-03-18 ENCOUNTER — Encounter: Payer: Self-pay | Admitting: General Practice

## 2014-04-22 ENCOUNTER — Other Ambulatory Visit: Payer: Self-pay | Admitting: Advanced Practice Midwife

## 2014-04-22 ENCOUNTER — Encounter: Payer: Self-pay | Admitting: Advanced Practice Midwife

## 2014-04-22 ENCOUNTER — Ambulatory Visit (INDEPENDENT_AMBULATORY_CARE_PROVIDER_SITE_OTHER): Payer: Medicaid Other | Admitting: Advanced Practice Midwife

## 2014-04-22 VITALS — BP 130/73 | HR 95 | Temp 98.3°F | Wt 256.0 lb

## 2014-04-22 DIAGNOSIS — O10911 Unspecified pre-existing hypertension complicating pregnancy, first trimester: Secondary | ICD-10-CM | POA: Diagnosis present

## 2014-04-22 DIAGNOSIS — I10 Essential (primary) hypertension: Secondary | ICD-10-CM

## 2014-04-22 DIAGNOSIS — Z3682 Encounter for antenatal screening for nuchal translucency: Secondary | ICD-10-CM

## 2014-04-22 DIAGNOSIS — O10919 Unspecified pre-existing hypertension complicating pregnancy, unspecified trimester: Secondary | ICD-10-CM | POA: Insufficient documentation

## 2014-04-22 DIAGNOSIS — Z23 Encounter for immunization: Secondary | ICD-10-CM | POA: Diagnosis not present

## 2014-04-22 DIAGNOSIS — O10912 Unspecified pre-existing hypertension complicating pregnancy, second trimester: Secondary | ICD-10-CM

## 2014-04-22 LAB — POCT URINALYSIS DIP (DEVICE)
Bilirubin Urine: NEGATIVE
Glucose, UA: NEGATIVE mg/dL
Hgb urine dipstick: NEGATIVE
KETONES UR: NEGATIVE mg/dL
LEUKOCYTES UA: NEGATIVE
Nitrite: NEGATIVE
Protein, ur: 100 mg/dL — AB
Specific Gravity, Urine: 1.015 (ref 1.005–1.030)
Urobilinogen, UA: 1 mg/dL (ref 0.0–1.0)
pH: 8.5 — ABNORMAL HIGH (ref 5.0–8.0)

## 2014-04-22 NOTE — Progress Notes (Signed)
New OB. Routines reviewed. Will repeat PIH labs and 24 hr urine. Stopped taking Labetalol due to headaches. Will try going to  bid and titrating up. If still intolerant, consider Aldomet. Took up to  Labetalol last pregnancy. See Smart Set.   Subjective:    Judy Wood is a G2P1001 [redacted]w[redacted]d being seen today for her first obstetrical visit.  Her obstetrical history is significant for obesity and Chronic Hypertension. Patient does intend to breast feed. Pregnancy history fully reviewed.  Patient reports no complaints.  Filed Vitals:   04/22/14 0947  BP: 130/73  Pulse: 95  Temp: 98.3 F (36.8 C)  Weight: 256 lb (116.121 kg)    HISTORY: OB History  Gravida Para Term Preterm AB SAB TAB Ectopic Multiple Living  # Outcome Date GA Lbr Len/2nd Weight Sex Delivery Anes PTL Lv  2 Current           1 Term 05/19/12 [redacted]w[redacted]d  6 lb 5.9 oz (2.889 kg) F CS-Classical EPI  Y     Past Medical History  Diagnosis Date  . Hypertension     on meds  . Hernia, umbilical   . Hypertension   . H/O umbilical hernia repair   . Asthma     no current meds needed   Past Surgical History  Procedure Laterality Date  . Umbilical hernia repair      as a child  . Umbilical hernia repair    . Cesarean section N/A 05/19/2012    Procedure:  Primary CESAREAN SECTION  of baby girl  at 1933  APGAR 4/5/5;  Surgeon: Adam Phenix, MD;  Location: [facility];  Service: Obstetrics;  Laterality: N/A;   Family History  Problem Relation Age of Onset  . Hypertension Mother   . Anemia Mother   . Depression Mother   . Deep vein thrombosis Maternal Grandmother   . Diabetes Maternal Grandmother   . Kidney disease Maternal Grandmother   . Cancer Maternal Grandmother     colon cancer  . Diabetes Father      Exam    Uterus:     Pelvic Exam:    Perineum: No Hemorrhoids, Normal Perineum   Vulva: Bartholin's, Urethra, Skene's normal   Vagina:  normal mucosa, normal discharge   pH:    Cervix:  no cervical motion tenderness   Adnexa: normal adnexa and no mass, fullness, tenderness   Bony Pelvis: gynecoid  System: Breast:  normal appearance, no masses or tenderness   Skin: normal coloration and turgor, no rashes    Neurologic: oriented, normal, grossly non-focal   Extremities: normal strength, tone, and muscle mass   HEENT neck supple with midline trachea   Mouth/Teeth mucous membranes moist, pharynx normal without lesions   Neck supple and no masses   Cardiovascular: regular rate and rhythm   Respiratory:  appears well, vitals normal, no respiratory distress, acyanotic, normal RR, ear and throat exam is normal, neck free of mass or lymphadenopathy, chest clear, no wheezing, crepitations, rhonchi, normal symmetric air entry   Abdomen: soft, non-tender; bowel sounds normal; no masses,  no organomegaly   Urinary: urethral meatus normal      Assessment:    Pregnancy: G2P1001 Patient Active Problem List   Diagnosis Date Noted  . Chronic hypertension complicating or reason for care during pregnancy 04/22/2014  . Status post primary low transverse cesarean section 05/23/2012  Plan:     Initial labs drawn. Prenatal vitamins. Problem list reviewed and updated. Genetic Screening discussed First Screen: ordered.  Ultrasound discussed; fetal survey: ordered.  Follow up in 4 weeks. 50% of 30 min visit spent on counseling and coordination of care.   Discussed trying 100mg  Labetalol and graduating up to 200mg  PIH labs ordered with 24hr urine   [facility] Hospital[facility]ILLIAMS,[name] 04/22/2014

## 2014-04-22 NOTE — Progress Notes (Signed)
Initial visit today. Patient reports she has not been taking Labetalol because it gave her severe headaches.  C/o of occasional pelvic cramping.  Prenatal profile and early 1hr gtt today (BMI).  New OB packet given.   First screen scheduled for 04/23/14 3:45PM

## 2014-04-22 NOTE — Patient Instructions (Signed)
Second Trimester of Pregnancy The second trimester is from week 13 through week 28, months 4 through 6. The second trimester is often a time when you feel your best. Your body has also adjusted to being pregnant, and you begin to feel better physically. Usually, morning sickness has lessened or quit completely, you may have more energy, and you may have an increase in appetite. The second trimester is also a time when the fetus is growing rapidly. At the end of the sixth month, the fetus is about 9 inches long and weighs about 1 pounds. You will likely begin to feel the baby move (quickening) between 18 and 20 weeks of the pregnancy. BODY CHANGES Your body goes through many changes during pregnancy. The changes vary from woman to woman.   Your weight will continue to increase. You will notice your lower abdomen bulging out.  You may begin to get stretch marks on your hips, abdomen, and breasts.  You may develop headaches that can be relieved by medicines approved by your health care provider.  You may urinate more often because the fetus is pressing on your bladder.  You may develop or continue to have heartburn as a result of your pregnancy.  You may develop constipation because certain hormones are causing the muscles that push waste through your intestines to slow down.  You may develop hemorrhoids or swollen, bulging veins (varicose veins).  You may have back pain because of the weight gain and pregnancy hormones relaxing your joints between the bones in your pelvis and as a result of a shift in weight and the muscles that support your balance.  Your breasts will continue to grow and be tender.  Your gums may bleed and may be sensitive to brushing and flossing.  Dark spots or blotches (chloasma, mask of pregnancy) may develop on your face. This will likely fade after the baby is born.  A dark line from your belly button to the pubic area (linea nigra) may appear. This will likely fade  after the baby is born.  You may have changes in your hair. These can include thickening of your hair, rapid growth, and changes in texture. Some women also have hair loss during or after pregnancy, or hair that feels dry or thin. Your hair will most likely return to normal after your baby is born. WHAT TO EXPECT AT YOUR PRENATAL VISITS During a routine prenatal visit:  You will be weighed to make sure you and the fetus are growing normally.  Your blood pressure will be taken.  Your abdomen will be measured to track your baby's growth.  The fetal heartbeat will be listened to.  Any test results from the previous visit will be discussed. Your health care provider may ask you:  How you are feeling.  If you are feeling the baby move.  If you have had any abnormal symptoms, such as leaking fluid, bleeding, severe headaches, or abdominal cramping.  If you have any questions. Other tests that may be performed during your second trimester include:  Blood tests that check for:  Low iron levels (anemia).  Gestational diabetes (between 24 and 28 weeks).  Rh antibodies.  Urine tests to check for infections, diabetes, or protein in the urine.  An ultrasound to confirm the proper growth and development of the baby.  An amniocentesis to check for possible genetic problems.  Fetal screens for spina bifida and Down syndrome. HOME CARE INSTRUCTIONS   Avoid all smoking, herbs, alcohol, and unprescribed   drugs. These chemicals affect the formation and growth of the baby.  Follow your health care provider's instructions regarding medicine use. There are medicines that are either safe or unsafe to take during pregnancy.  Exercise only as directed by your health care provider. Experiencing uterine cramps is a good sign to stop exercising.  Continue to eat regular, healthy meals.  Wear a good support bra for breast tenderness.  Do not use hot tubs, steam rooms, or saunas.  Wear your  seat belt at all times when driving.  Avoid raw meat, uncooked cheese, cat litter boxes, and soil used by cats. These carry germs that can cause birth defects in the baby.  Take your prenatal vitamins.  Try taking a stool softener (if your health care provider approves) if you develop constipation. Eat more high-fiber foods, such as fresh vegetables or fruit and whole grains. Drink plenty of fluids to keep your urine clear or pale yellow.  Take warm sitz baths to soothe any pain or discomfort caused by hemorrhoids. Use hemorrhoid cream if your health care provider approves.  If you develop varicose veins, wear support hose. Elevate your feet for 15 minutes, 3-4 times a day. Limit salt in your diet.  Avoid heavy lifting, wear low heel shoes, and practice good posture.  Rest with your legs elevated if you have leg cramps or low back pain.  Visit your dentist if you have not gone yet during your pregnancy. Use a soft toothbrush to brush your teeth and be gentle when you floss.  A sexual relationship may be continued unless your health care provider directs you otherwise.  Continue to go to all your prenatal visits as directed by your health care provider. SEEK MEDICAL CARE IF:   You have dizziness.  You have mild pelvic cramps, pelvic pressure, or nagging pain in the abdominal area.  You have persistent nausea, vomiting, or diarrhea.  You have a bad smelling vaginal discharge.  You have pain with urination. SEEK IMMEDIATE MEDICAL CARE IF:   You have a fever.  You are leaking fluid from your vagina.  You have spotting or bleeding from your vagina.  You have severe abdominal cramping or pain.  You have rapid weight gain or loss.  You have shortness of breath with chest pain.  You notice sudden or extreme swelling of your face, hands, ankles, feet, or legs.  You have not felt your baby move in over an hour.  You have severe headaches that do not go away with  medicine.  You have vision changes. Document Released: 02/08/2001 Document Revised: 02/19/2013 Document Reviewed: 04/17/2012 ExitCare Patient Information 2015 [employer]. This information is not intended to replace advice given to you by your health care provider. Make sure you discuss any questions you have with your health care provider.  

## 2014-04-22 NOTE — Addendum Note (Signed)
Addended by: Aviva SignsWILLIAMS, Taden Witter L on: 04/22/2014 11:21 AM   Modules accepted: Orders

## 2014-04-23 ENCOUNTER — Encounter (HOSPITAL_COMMUNITY): Payer: Self-pay

## 2014-04-23 ENCOUNTER — Ambulatory Visit (HOSPITAL_COMMUNITY)
Admission: RE | Admit: 2014-04-23 | Discharge: 2014-04-23 | Disposition: A | Discharge status: Home / Self Care | Payer: Medicaid Other | Source: Ambulatory Visit | Attending: Advanced Practice Midwife | Admitting: Advanced Practice Midwife

## 2014-04-23 DIAGNOSIS — O99211 Obesity complicating pregnancy, first trimester: Secondary | ICD-10-CM | POA: Insufficient documentation

## 2014-04-23 DIAGNOSIS — Z36 Encounter for antenatal screening of mother: Secondary | ICD-10-CM | POA: Diagnosis not present

## 2014-04-23 DIAGNOSIS — O10011 Pre-existing essential hypertension complicating pregnancy, first trimester: Secondary | ICD-10-CM | POA: Insufficient documentation

## 2014-04-23 DIAGNOSIS — O09291 Supervision of pregnancy with other poor reproductive or obstetric history, first trimester: Secondary | ICD-10-CM | POA: Diagnosis not present

## 2014-04-23 DIAGNOSIS — Z3682 Encounter for antenatal screening for nuchal translucency: Secondary | ICD-10-CM | POA: Insufficient documentation

## 2014-04-23 DIAGNOSIS — Z3A12 12 weeks gestation of pregnancy: Secondary | ICD-10-CM | POA: Diagnosis not present

## 2014-04-23 LAB — PRENATAL PROFILE (SOLSTAS)
Antibody Screen: NEGATIVE
Basophils Absolute: 0 10*3/uL (ref 0.0–0.1)
Basophils Relative: 0 % (ref 0–1)
EOS PCT: 1 % (ref 0–5)
Eosinophils Absolute: 0.1 10*3/uL (ref 0.0–0.7)
HCT: 32.5 % — ABNORMAL LOW (ref 36.0–46.0)
HIV 1&2 Ab, 4th Generation: NONREACTIVE
Hemoglobin: 10.9 g/dL — ABNORMAL LOW (ref 12.0–15.0)
Hepatitis B Surface Ag: NEGATIVE
LYMPHS ABS: 1.6 10*3/uL (ref 0.7–4.0)
Lymphocytes Relative: 25 % (ref 12–46)
MCH: 28.8 pg (ref 26.0–34.0)
MCHC: 33.5 g/dL (ref 30.0–36.0)
MCV: 86 fL (ref 78.0–100.0)
MONO ABS: 0.4 10*3/uL (ref 0.1–1.0)
MPV: 9.5 fL (ref 8.6–12.4)
Monocytes Relative: 7 % (ref 3–12)
Neutro Abs: 4.2 10*3/uL (ref 1.7–7.7)
Neutrophils Relative %: 67 % (ref 43–77)
Platelets: 356 10*3/uL (ref 150–400)
RBC: 3.78 MIL/uL — AB (ref 3.87–5.11)
RDW: 13.1 % (ref 11.5–15.5)
Rh Type: POSITIVE
Rubella: 3.46 Index — ABNORMAL HIGH (ref <0.90)
WBC: 6.2 10*3/uL (ref 4.0–10.5)

## 2014-04-23 LAB — COMPREHENSIVE METABOLIC PANEL
ALK PHOS: 50 U/L (ref 39–117)
ALT: 8 U/L (ref 0–35)
AST: 9 U/L (ref 0–37)
Albumin: 3.8 g/dL (ref 3.5–5.2)
BUN: 6 mg/dL (ref 6–23)
CO2: 19 mEq/L (ref 19–32)
CREATININE: 0.53 mg/dL (ref 0.50–1.10)
Calcium: 8.9 mg/dL (ref 8.4–10.5)
Chloride: 103 mEq/L (ref 96–112)
Glucose, Bld: 102 mg/dL — ABNORMAL HIGH (ref 70–99)
POTASSIUM: 3.7 meq/L (ref 3.5–5.3)
Sodium: 134 mEq/L — ABNORMAL LOW (ref 135–145)
Total Bilirubin: 0.3 mg/dL (ref 0.2–1.2)
Total Protein: 7.2 g/dL (ref 6.0–8.3)

## 2014-04-23 LAB — PROTEIN / CREATININE RATIO, URINE
Creatinine, Urine: 213.9 mg/dL
Protein Creatinine Ratio: 0.12 (ref <0.15)
TOTAL PROTEIN, URINE: 26 mg/dL — AB (ref 5–24)

## 2014-04-23 LAB — GC/CHLAMYDIA PROBE AMP
CT PROBE, AMP APTIMA: NEGATIVE
GC Probe RNA: NEGATIVE

## 2014-04-24 LAB — PRESCRIPTION MONITORING PROFILE (19 PANEL)
Amphetamine/Meth: NEGATIVE ng/mL
Barbiturate Screen, Urine: NEGATIVE ng/mL
Benzodiazepine Screen, Urine: NEGATIVE ng/mL
Buprenorphine, Urine: NEGATIVE ng/mL
Cannabinoid Scrn, Ur: NEGATIVE ng/mL
Carisoprodol, Urine: NEGATIVE ng/mL
Cocaine Metabolites: NEGATIVE ng/mL
Creatinine, Urine: 222.17 mg/dL
Fentanyl, Ur: NEGATIVE ng/mL
MDMA URINE: NEGATIVE ng/mL
Meperidine, Ur: NEGATIVE ng/mL
Methadone Screen, Urine: NEGATIVE ng/mL
Methaqualone: NEGATIVE ng/mL
Nitrites, Initial: NEGATIVE ug/mL
Opiate Screen, Urine: NEGATIVE ng/mL
Oxycodone Screen, Ur: NEGATIVE ng/mL
Phencyclidine, Ur: NEGATIVE ng/mL
Propoxyphene: NEGATIVE ng/mL
Tapentadol, urine: NEGATIVE ng/mL
Tramadol Scrn, Ur: NEGATIVE ng/mL
Zolpidem, Urine: NEGATIVE ng/mL
pH, Initial: 8.4 pH (ref 4.5–8.9)

## 2014-04-24 LAB — CULTURE, OB URINE: Colony Count: 30000

## 2014-04-25 ENCOUNTER — Encounter: Payer: Self-pay | Admitting: Obstetrics & Gynecology

## 2014-04-25 LAB — PROTEIN, URINE, 24 HOUR
Protein, 24H Urine: 149 mg/d (ref <150)
Protein, Urine: 9 mg/dL (ref 5–24)

## 2014-04-28 ENCOUNTER — Telehealth: Payer: Self-pay | Admitting: General Practice

## 2014-04-28 DIAGNOSIS — O10911 Unspecified pre-existing hypertension complicating pregnancy, first trimester: Secondary | ICD-10-CM

## 2014-04-28 MED ORDER — LABETALOL HCL 100 MG PO TABS: 100.0000 mg | ORAL_TABLET | Freq: Two times a day (BID) | ORAL | Status: DC

## 2014-04-29 ENCOUNTER — Other Ambulatory Visit (HOSPITAL_COMMUNITY): Payer: Self-pay

## 2014-04-30 ENCOUNTER — Ambulatory Visit (HOSPITAL_COMMUNITY): Payer: Self-pay

## 2014-05-02 ENCOUNTER — Encounter: Payer: Self-pay | Admitting: General Practice

## 2014-05-02 NOTE — Telephone Encounter (Signed)
No telephone call to patient, [facility]

## 2014-05-08 ENCOUNTER — Encounter: Payer: Self-pay | Admitting: Obstetrics & Gynecology

## 2014-05-22 ENCOUNTER — Ambulatory Visit (INDEPENDENT_AMBULATORY_CARE_PROVIDER_SITE_OTHER): Payer: Medicaid Other | Admitting: Family Medicine

## 2014-05-22 ENCOUNTER — Encounter: Payer: Self-pay | Admitting: Obstetrics & Gynecology

## 2014-05-22 VITALS — BP 118/77 | HR 98 | Temp 98.7°F | Wt 256.1 lb

## 2014-05-22 DIAGNOSIS — O2240 Hemorrhoids in pregnancy, unspecified trimester: Secondary | ICD-10-CM

## 2014-05-22 DIAGNOSIS — I10 Essential (primary) hypertension: Secondary | ICD-10-CM

## 2014-05-22 DIAGNOSIS — O10912 Unspecified pre-existing hypertension complicating pregnancy, second trimester: Secondary | ICD-10-CM

## 2014-05-22 LAB — POCT URINALYSIS DIP (DEVICE)
Bilirubin Urine: NEGATIVE
Glucose, UA: NEGATIVE mg/dL
HGB URINE DIPSTICK: NEGATIVE
Ketones, ur: NEGATIVE mg/dL
Leukocytes, UA: NEGATIVE
NITRITE: NEGATIVE
PH: 7 (ref 5.0–8.0)
Protein, ur: 30 mg/dL — AB
Specific Gravity, Urine: 1.025 (ref 1.005–1.030)
Urobilinogen, UA: 0.2 mg/dL (ref 0.0–1.0)

## 2014-05-22 MED ORDER — HYDROCORTISONE 2.5 % RE CREA: 1.0000 "application " | TOPICAL_CREAM | Freq: Two times a day (BID) | RECTAL | Status: DC

## 2014-05-22 NOTE — Progress Notes (Signed)
Patient is 19 y.o. G2P1001 6072w4d.  +FM, denies LOF, VB.  Overall feeling well.  Labs reviewed, first screen normal, MSAFP ordered today.  # Hemorrhoids:increased PO, increase fiber intake, colace, rx anusol # cHTN: takes labetalol intermittently, did not take today.  Given normal BP will d/c labetalol, have pt return to clinic for BP check in 1 week and reeval if should continue/stop

## 2014-05-22 NOTE — Progress Notes (Signed)
Pt reports continuing to have headaches even after taking Labetalol and BP is normal; Pt states that she does not take the Labetalol as prescribed she has to be reminded

## 2014-05-26 LAB — ALPHA FETOPROTEIN, MATERNAL
AFP: 52 ng/mL
Curr Gest Age: 16.4 wks.days
MoM for AFP: 1.84
Open Spina bifida: NEGATIVE

## 2014-06-19 ENCOUNTER — Ambulatory Visit (INDEPENDENT_AMBULATORY_CARE_PROVIDER_SITE_OTHER): Payer: Medicaid Other | Admitting: Obstetrics and Gynecology

## 2014-06-19 ENCOUNTER — Encounter: Payer: Self-pay | Admitting: Obstetrics and Gynecology

## 2014-06-19 VITALS — BP 125/65 | HR 88 | Temp 98.2°F | Wt 251.9 lb

## 2014-06-19 DIAGNOSIS — O10912 Unspecified pre-existing hypertension complicating pregnancy, second trimester: Secondary | ICD-10-CM

## 2014-06-19 DIAGNOSIS — I10 Essential (primary) hypertension: Secondary | ICD-10-CM

## 2014-06-19 LAB — POCT URINALYSIS DIP (DEVICE)
Bilirubin Urine: NEGATIVE
Glucose, UA: NEGATIVE mg/dL
Hgb urine dipstick: NEGATIVE
KETONES UR: NEGATIVE mg/dL
Leukocytes, UA: NEGATIVE
Nitrite: NEGATIVE
PH: 6.5 (ref 5.0–8.0)
PROTEIN: NEGATIVE mg/dL
Specific Gravity, Urine: 1.025 (ref 1.005–1.030)
Urobilinogen, UA: 1 mg/dL (ref 0.0–1.0)

## 2014-06-19 NOTE — Progress Notes (Signed)
DNKA for BP recheck. Taking labetalol 100 bid for last 2 days. Advised to d/c and come for BP check in 1 wk.  Anatomic US scheduled. Advised to start BASA 1/d as cHTN documented.  Discussed R/B of TOLAC vs R C/S> desires TOLAC. Consent signed. Hemorrhoids improved.

## 2014-06-19 NOTE — Progress Notes (Signed)
Anatomy scan scheduled for 06/27/14 at 0900. Patient signed VBAC consent.

## 2014-06-19 NOTE — Progress Notes (Signed)
Reevaluate need for labetalol

## 2014-06-27 ENCOUNTER — Ambulatory Visit (HOSPITAL_COMMUNITY): Payer: Medicaid Other

## 2014-06-30 ENCOUNTER — Ambulatory Visit (HOSPITAL_COMMUNITY)
Admission: RE | Admit: 2014-06-30 | Discharge: 2014-06-30 | Disposition: A | Discharge status: Home / Self Care | Payer: Medicaid Other | Source: Ambulatory Visit | Attending: Obstetrics and Gynecology | Admitting: Obstetrics and Gynecology

## 2014-06-30 ENCOUNTER — Other Ambulatory Visit: Payer: Self-pay | Admitting: Obstetrics and Gynecology

## 2014-06-30 DIAGNOSIS — Z3A22 22 weeks gestation of pregnancy: Secondary | ICD-10-CM | POA: Insufficient documentation

## 2014-06-30 DIAGNOSIS — O3421 Maternal care for scar from previous cesarean delivery: Secondary | ICD-10-CM | POA: Diagnosis not present

## 2014-06-30 DIAGNOSIS — O10912 Unspecified pre-existing hypertension complicating pregnancy, second trimester: Secondary | ICD-10-CM

## 2014-06-30 DIAGNOSIS — O09292 Supervision of pregnancy with other poor reproductive or obstetric history, second trimester: Secondary | ICD-10-CM | POA: Diagnosis not present

## 2014-06-30 DIAGNOSIS — O10012 Pre-existing essential hypertension complicating pregnancy, second trimester: Secondary | ICD-10-CM | POA: Insufficient documentation

## 2014-06-30 DIAGNOSIS — O99210 Obesity complicating pregnancy, unspecified trimester: Secondary | ICD-10-CM | POA: Insufficient documentation

## 2014-07-03 ENCOUNTER — Encounter: Payer: Self-pay | Admitting: Obstetrics & Gynecology

## 2014-07-03 ENCOUNTER — Encounter: Payer: Self-pay | Admitting: Family Medicine

## 2014-07-03 ENCOUNTER — Encounter: Payer: Medicaid Other | Admitting: Family Medicine

## 2014-07-14 ENCOUNTER — Encounter (HOSPITAL_COMMUNITY): Payer: Self-pay

## 2014-07-14 ENCOUNTER — Emergency Department (HOSPITAL_COMMUNITY): Payer: Medicaid Other

## 2014-07-14 ENCOUNTER — Emergency Department (HOSPITAL_COMMUNITY)
Admission: EM | Admit: 2014-07-14 | Discharge: 2014-07-14 | Disposition: A | Discharge status: Home / Self Care | Payer: Medicaid Other | Attending: Emergency Medicine | Admitting: Emergency Medicine

## 2014-07-14 DIAGNOSIS — O99332 Smoking (tobacco) complicating pregnancy, second trimester: Secondary | ICD-10-CM | POA: Insufficient documentation

## 2014-07-14 DIAGNOSIS — O10012 Pre-existing essential hypertension complicating pregnancy, second trimester: Secondary | ICD-10-CM | POA: Diagnosis not present

## 2014-07-14 DIAGNOSIS — Z8719 Personal history of other diseases of the digestive system: Secondary | ICD-10-CM | POA: Insufficient documentation

## 2014-07-14 DIAGNOSIS — Z3A25 25 weeks gestation of pregnancy: Secondary | ICD-10-CM | POA: Insufficient documentation

## 2014-07-14 DIAGNOSIS — O99512 Diseases of the respiratory system complicating pregnancy, second trimester: Secondary | ICD-10-CM | POA: Insufficient documentation

## 2014-07-14 DIAGNOSIS — R079 Chest pain, unspecified: Secondary | ICD-10-CM

## 2014-07-14 DIAGNOSIS — Y92410 Unspecified street and highway as the place of occurrence of the external cause: Secondary | ICD-10-CM | POA: Insufficient documentation

## 2014-07-14 DIAGNOSIS — Z79899 Other long term (current) drug therapy: Secondary | ICD-10-CM | POA: Insufficient documentation

## 2014-07-14 DIAGNOSIS — Y998 Other external cause status: Secondary | ICD-10-CM | POA: Insufficient documentation

## 2014-07-14 DIAGNOSIS — R0789 Other chest pain: Secondary | ICD-10-CM

## 2014-07-14 DIAGNOSIS — O9A212 Injury, poisoning and certain other consequences of external causes complicating pregnancy, second trimester: Secondary | ICD-10-CM | POA: Insufficient documentation

## 2014-07-14 DIAGNOSIS — F17210 Nicotine dependence, cigarettes, uncomplicated: Secondary | ICD-10-CM | POA: Insufficient documentation

## 2014-07-14 DIAGNOSIS — Y9389 Activity, other specified: Secondary | ICD-10-CM | POA: Insufficient documentation

## 2014-07-14 DIAGNOSIS — J45909 Unspecified asthma, uncomplicated: Secondary | ICD-10-CM | POA: Diagnosis not present

## 2014-07-14 DIAGNOSIS — S20311A Abrasion of right front wall of thorax, initial encounter: Secondary | ICD-10-CM | POA: Diagnosis not present

## 2014-07-14 MED ORDER — ACETAMINOPHEN 325 MG PO TABS
650.0000 mg | ORAL_TABLET | Freq: Once | ORAL | Status: AC
  Administered 2014-07-14: 650 mg via ORAL
  Filled 2014-07-14: qty 2

## 2014-07-14 MED ORDER — ACETAMINOPHEN 500 MG PO TABS: 500.0000 mg | ORAL_TABLET | Freq: Four times a day (QID) | ORAL | Status: DC | PRN
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
## 2014-07-14 NOTE — ED Notes (Signed)
Pt stable, ambulatory, mom at bedside, states understanding of discharge instructions

## 2014-07-14 NOTE — ED Notes (Signed)
Pt seen, FHR easily detected with doppler. Regular and reassuring. Unable to obtain continuously external tracing. Reviewed s/s of PTC/PTL, bleeding or loss of amniotic fluid. Pt has an appointment on Thursday at [facility] for regular OB visit. Pt denies any ob complaints. Call to [facility] attending Dr Kennon Rounds. Reviewed case. Will obtain one additional doppler reading when pt returns from Xray then pt is cleared from OB.
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
## 2014-07-15 ENCOUNTER — Encounter: Payer: Self-pay | Admitting: *Deleted

## 2014-07-17 ENCOUNTER — Telehealth: Payer: Self-pay | Admitting: Family

## 2014-07-17 ENCOUNTER — Encounter: Payer: Medicaid Other | Admitting: Family

## 2014-07-17 ENCOUNTER — Encounter: Payer: Self-pay | Admitting: Family

## 2014-07-17 NOTE — Telephone Encounter (Signed)
Called both numbers on file numbers are not working and unable to leave message.

## 2014-07-19 ENCOUNTER — Encounter: Payer: Self-pay | Admitting: Obstetrics & Gynecology

## 2014-07-21 ENCOUNTER — Ambulatory Visit (INDEPENDENT_AMBULATORY_CARE_PROVIDER_SITE_OTHER): Payer: Medicaid Other | Admitting: Family Medicine

## 2014-07-21 VITALS — BP 136/79 | HR 105 | Wt 250.0 lb

## 2014-07-21 DIAGNOSIS — I10 Essential (primary) hypertension: Secondary | ICD-10-CM

## 2014-07-21 DIAGNOSIS — O10912 Unspecified pre-existing hypertension complicating pregnancy, second trimester: Secondary | ICD-10-CM

## 2014-07-21 LAB — POCT URINALYSIS DIP (DEVICE)
GLUCOSE, UA: NEGATIVE mg/dL
HGB URINE DIPSTICK: NEGATIVE
LEUKOCYTES UA: NEGATIVE
Nitrite: NEGATIVE
Protein, ur: 30 mg/dL — AB
Specific Gravity, Urine: 1.025 (ref 1.005–1.030)
Urobilinogen, UA: 1 mg/dL (ref 0.0–1.0)
pH: 7 (ref 5.0–8.0)

## 2014-07-21 MED ORDER — ASPIRIN EC 81 MG PO TBEC: 81.0000 mg | DELAYED_RELEASE_TABLET | Freq: Every day | ORAL | Status: DC

## 2014-07-21 NOTE — Patient Instructions (Signed)
Second Trimester of Pregnancy The second trimester is from week 13 through week 28, months 4 through 6. The second trimester is often a time when you feel your best. Your body has also adjusted to being pregnant, and you begin to feel better physically. Usually, morning sickness has lessened or quit completely, you may have more energy, and you may have an increase in appetite. The second trimester is also a time when the fetus is growing rapidly. At the end of the sixth month, the fetus is about 9 inches long and weighs about 1 pounds. You will likely begin to feel the baby move (quickening) between 18 and 20 weeks of the pregnancy. BODY CHANGES Your body goes through many changes during pregnancy. The changes vary from woman to woman.   Your weight will continue to increase. You will notice your lower abdomen bulging out.  You may begin to get stretch marks on your hips, abdomen, and breasts.  You may develop headaches that can be relieved by medicines approved by your health care provider.  You may urinate more often because the fetus is pressing on your bladder.  You may develop or continue to have heartburn as a result of your pregnancy.  You may develop constipation because certain hormones are causing the muscles that push waste through your intestines to slow down.  You may develop hemorrhoids or swollen, bulging veins (varicose veins).  You may have back pain because of the weight gain and pregnancy hormones relaxing your joints between the bones in your pelvis and as a result of a shift in weight and the muscles that support your balance.  Your breasts will continue to grow and be tender.  Your gums may bleed and may be sensitive to brushing and flossing.  Dark spots or blotches (chloasma, mask of pregnancy) may develop on your face. This will likely fade after the baby is born.  A dark line from your belly button to the pubic area (linea nigra) may appear. This will likely fade  after the baby is born.  You may have changes in your hair. These can include thickening of your hair, rapid growth, and changes in texture. Some women also have hair loss during or after pregnancy, or hair that feels dry or thin. Your hair will most likely return to normal after your baby is born. WHAT TO EXPECT AT YOUR PRENATAL VISITS During a routine prenatal visit:  You will be weighed to make sure you and the fetus are growing normally.  Your blood pressure will be taken.  Your abdomen will be measured to track your baby's growth.  The fetal heartbeat will be listened to.  Any test results from the previous visit will be discussed. Your health care provider may ask you:  How you are feeling.  If you are feeling the baby move.  If you have had any abnormal symptoms, such as leaking fluid, bleeding, severe headaches, or abdominal cramping.  If you have any questions. Other tests that may be performed during your second trimester include:  Blood tests that check for:  Low iron levels (anemia).  Gestational diabetes (between 24 and 28 weeks).  Rh antibodies.  Urine tests to check for infections, diabetes, or protein in the urine.  An ultrasound to confirm the proper growth and development of the baby.  An amniocentesis to check for possible genetic problems.  Fetal screens for spina bifida and Down syndrome. HOME CARE INSTRUCTIONS   Avoid all smoking, herbs, alcohol, and unprescribed   drugs. These chemicals affect the formation and growth of the baby.  Follow your health care provider's instructions regarding medicine use. There are medicines that are either safe or unsafe to take during pregnancy.  Exercise only as directed by your health care provider. Experiencing uterine cramps is a good sign to stop exercising.  Continue to eat regular, healthy meals.  Wear a good support bra for breast tenderness.  Do not use hot tubs, steam rooms, or saunas.  Wear your  seat belt at all times when driving.  Avoid raw meat, uncooked cheese, cat litter boxes, and soil used by cats. These carry germs that can cause birth defects in the baby.  Take your prenatal vitamins.  Try taking a stool softener (if your health care provider approves) if you develop constipation. Eat more high-fiber foods, such as fresh vegetables or fruit and whole grains. Drink plenty of fluids to keep your urine clear or pale yellow.  Take warm sitz baths to soothe any pain or discomfort caused by hemorrhoids. Use hemorrhoid cream if your health care provider approves.  If you develop varicose veins, wear support hose. Elevate your feet for 15 minutes, 3-4 times a day. Limit salt in your diet.  Avoid heavy lifting, wear low heel shoes, and practice good posture.  Rest with your legs elevated if you have leg cramps or low back pain.  Visit your dentist if you have not gone yet during your pregnancy. Use a soft toothbrush to brush your teeth and be gentle when you floss.  A sexual relationship may be continued unless your health care provider directs you otherwise.  Continue to go to all your prenatal visits as directed by your health care provider. SEEK MEDICAL CARE IF:   You have dizziness.  You have mild pelvic cramps, pelvic pressure, or nagging pain in the abdominal area.  You have persistent nausea, vomiting, or diarrhea.  You have a bad smelling vaginal discharge.  You have pain with urination. SEEK IMMEDIATE MEDICAL CARE IF:   You have a fever.  You are leaking fluid from your vagina.  You have spotting or bleeding from your vagina.  You have severe abdominal cramping or pain.  You have rapid weight gain or loss.  You have shortness of breath with chest pain.  You notice sudden or extreme swelling of your face, hands, ankles, feet, or legs.  You have not felt your baby move in over an hour.  You have severe headaches that do not go away with  medicine.  You have vision changes. Document Released: 02/08/2001 Document Revised: 02/19/2013 Document Reviewed: 04/17/2012 ExitCare Patient Information 2015 [employer]. This information is not intended to replace advice given to you by your health care provider. Make sure you discuss any questions you have with your health care provider.  
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
## 2014-07-29 ENCOUNTER — Encounter (HOSPITAL_COMMUNITY): Payer: Self-pay

## 2014-07-29 ENCOUNTER — Inpatient Hospital Stay (HOSPITAL_COMMUNITY)
Admission: AD | Admit: 2014-07-29 | Discharge: 2014-08-15 | DRG: 781 | Disposition: A | Discharge status: Home / Self Care | Payer: Medicaid Other | Source: Ambulatory Visit | Attending: Obstetrics and Gynecology | Admitting: Obstetrics and Gynecology

## 2014-07-29 ENCOUNTER — Inpatient Hospital Stay (HOSPITAL_COMMUNITY): Payer: Medicaid Other

## 2014-07-29 DIAGNOSIS — O99012 Anemia complicating pregnancy, second trimester: Secondary | ICD-10-CM | POA: Diagnosis present

## 2014-07-29 DIAGNOSIS — O365931 Maternal care for other known or suspected poor fetal growth, third trimester, fetus 1: Secondary | ICD-10-CM

## 2014-07-29 DIAGNOSIS — F17210 Nicotine dependence, cigarettes, uncomplicated: Secondary | ICD-10-CM | POA: Diagnosis present

## 2014-07-29 DIAGNOSIS — O365990 Maternal care for other known or suspected poor fetal growth, unspecified trimester, not applicable or unspecified: Secondary | ICD-10-CM | POA: Diagnosis present

## 2014-07-29 DIAGNOSIS — Z3A26 26 weeks gestation of pregnancy: Secondary | ICD-10-CM | POA: Diagnosis present

## 2014-07-29 DIAGNOSIS — O4692 Antepartum hemorrhage, unspecified, second trimester: Secondary | ICD-10-CM | POA: Diagnosis present

## 2014-07-29 DIAGNOSIS — O4103X0 Oligohydramnios, third trimester, not applicable or unspecified: Secondary | ICD-10-CM | POA: Diagnosis present

## 2014-07-29 DIAGNOSIS — O10912 Unspecified pre-existing hypertension complicating pregnancy, second trimester: Secondary | ICD-10-CM | POA: Diagnosis present

## 2014-07-29 DIAGNOSIS — O4693 Antepartum hemorrhage, unspecified, third trimester: Secondary | ICD-10-CM | POA: Diagnosis not present

## 2014-07-29 DIAGNOSIS — O328XX0 Maternal care for other malpresentation of fetus, not applicable or unspecified: Secondary | ICD-10-CM | POA: Diagnosis present

## 2014-07-29 DIAGNOSIS — O3421 Maternal care for scar from previous cesarean delivery: Secondary | ICD-10-CM | POA: Diagnosis present

## 2014-07-29 DIAGNOSIS — D500 Iron deficiency anemia secondary to blood loss (chronic): Secondary | ICD-10-CM | POA: Diagnosis present

## 2014-07-29 DIAGNOSIS — O4690 Antepartum hemorrhage, unspecified, unspecified trimester: Secondary | ICD-10-CM | POA: Diagnosis present

## 2014-07-29 DIAGNOSIS — I10 Essential (primary) hypertension: Secondary | ICD-10-CM | POA: Diagnosis not present

## 2014-07-29 DIAGNOSIS — O365920 Maternal care for other known or suspected poor fetal growth, second trimester, not applicable or unspecified: Secondary | ICD-10-CM | POA: Diagnosis present

## 2014-07-29 DIAGNOSIS — O42912 Preterm premature rupture of membranes, unspecified as to length of time between rupture and onset of labor, second trimester: Principal | ICD-10-CM | POA: Diagnosis present

## 2014-07-29 DIAGNOSIS — O321XX1 Maternal care for breech presentation, fetus 1: Secondary | ICD-10-CM

## 2014-07-29 DIAGNOSIS — O42919 Preterm premature rupture of membranes, unspecified as to length of time between rupture and onset of labor, unspecified trimester: Secondary | ICD-10-CM

## 2014-07-29 DIAGNOSIS — O4103X1 Oligohydramnios, third trimester, fetus 1: Secondary | ICD-10-CM | POA: Diagnosis not present

## 2014-07-29 DIAGNOSIS — IMO0002 Reserved for concepts with insufficient information to code with codable children: Secondary | ICD-10-CM

## 2014-07-29 DIAGNOSIS — O99332 Smoking (tobacco) complicating pregnancy, second trimester: Secondary | ICD-10-CM | POA: Diagnosis present

## 2014-07-29 DIAGNOSIS — O321XX0 Maternal care for breech presentation, not applicable or unspecified: Secondary | ICD-10-CM | POA: Diagnosis present

## 2014-07-29 DIAGNOSIS — O10919 Unspecified pre-existing hypertension complicating pregnancy, unspecified trimester: Secondary | ICD-10-CM | POA: Diagnosis present

## 2014-07-29 DIAGNOSIS — O34219 Maternal care for unspecified type scar from previous cesarean delivery: Secondary | ICD-10-CM | POA: Diagnosis present

## 2014-07-29 DIAGNOSIS — O365921 Maternal care for other known or suspected poor fetal growth, second trimester, fetus 1: Secondary | ICD-10-CM | POA: Diagnosis not present

## 2014-07-29 DIAGNOSIS — O10913 Unspecified pre-existing hypertension complicating pregnancy, third trimester: Secondary | ICD-10-CM | POA: Diagnosis not present

## 2014-07-29 DIAGNOSIS — O169 Unspecified maternal hypertension, unspecified trimester: Secondary | ICD-10-CM

## 2014-07-29 LAB — TYPE AND SCREEN
ABO/RH(D): A POS
Antibody Screen: NEGATIVE

## 2014-07-29 LAB — GROUP B STREP BY PCR: Group B strep by PCR: POSITIVE — AB

## 2014-07-29 LAB — CBC
HCT: 31.7 % — ABNORMAL LOW (ref 36.0–46.0)
Hemoglobin: 10.5 g/dL — ABNORMAL LOW (ref 12.0–15.0)
MCH: 28.7 pg (ref 26.0–34.0)
MCHC: 33.1 g/dL (ref 30.0–36.0)
MCV: 86.6 fL (ref 78.0–100.0)
PLATELETS: 329 10*3/uL (ref 150–400)
RBC: 3.66 MIL/uL — AB (ref 3.87–5.11)
RDW: 13 % (ref 11.5–15.5)
WBC: 9.2 10*3/uL (ref 4.0–10.5)

## 2014-07-29 LAB — OB RESULTS CONSOLE GC/CHLAMYDIA: Gonorrhea: POSITIVE

## 2014-07-29 LAB — OB RESULTS CONSOLE GBS: GBS: POSITIVE

## 2014-07-29 LAB — ABO/RH: ABO/RH(D): A POS

## 2014-07-29 MED ORDER — CALCIUM CARBONATE ANTACID 500 MG PO CHEW
2.0000 | CHEWABLE_TABLET | ORAL | Status: DC | PRN
  Filled 2014-07-29: qty 2

## 2014-07-29 MED ORDER — SODIUM CHLORIDE 0.9 % IV SOLN
2.0000 g | Freq: Four times a day (QID) | INTRAVENOUS | Status: AC
  Administered 2014-07-29 – 2014-07-31 (×8): 2 g via INTRAVENOUS
  Filled 2014-07-29 (×8): qty 2000

## 2014-07-29 MED ORDER — ACETAMINOPHEN 325 MG PO TABS
650.0000 mg | ORAL_TABLET | ORAL | Status: DC | PRN
  Administered 2014-08-05 – 2014-08-13 (×8): 650 mg via ORAL
  Filled 2014-07-29 (×9): qty 2

## 2014-07-29 MED ORDER — ASPIRIN EC 81 MG PO TBEC
81.0000 mg | DELAYED_RELEASE_TABLET | Freq: Every day | ORAL | Status: DC
  Administered 2014-07-29 – 2014-08-15 (×18): 81 mg via ORAL
  Filled 2014-07-29 (×19): qty 1

## 2014-07-29 MED ORDER — AMOXICILLIN 500 MG PO CAPS
500.0000 mg | ORAL_CAPSULE | Freq: Three times a day (TID) | ORAL | Status: AC
  Administered 2014-07-31 – 2014-08-07 (×20): 500 mg via ORAL
  Filled 2014-07-29 (×20): qty 1

## 2014-07-29 MED ORDER — PRENATAL MULTIVITAMIN CH
1.0000 | ORAL_TABLET | Freq: Every day | ORAL | Status: DC
  Administered 2014-07-30 – 2014-08-15 (×17): 1 via ORAL
  Filled 2014-07-29 (×12): qty 1
  Filled 2014-07-29: qty 10
  Filled 2014-07-29 (×5): qty 1

## 2014-07-29 MED ORDER — BETAMETHASONE SOD PHOS & ACET 6 (3-3) MG/ML IJ SUSP
12.0000 mg | INTRAMUSCULAR | Status: AC
  Administered 2014-07-29 – 2014-07-30 (×2): 12 mg via INTRAMUSCULAR
  Filled 2014-07-29 (×2): qty 2

## 2014-07-29 MED ORDER — SODIUM CHLORIDE 0.9 % IJ SOLN
3.0000 mL | Freq: Two times a day (BID) | INTRAMUSCULAR | Status: DC
  Administered 2014-07-29 – 2014-08-02 (×6): 3 mL via INTRAVENOUS

## 2014-07-29 MED ORDER — LABETALOL HCL 100 MG PO TABS
50.0000 mg | ORAL_TABLET | Freq: Two times a day (BID) | ORAL | Status: DC
  Administered 2014-07-29 – 2014-08-15 (×34): 50 mg via ORAL
  Filled 2014-07-29 (×34): qty 1

## 2014-07-29 MED ORDER — ZOLPIDEM TARTRATE 5 MG PO TABS
5.0000 mg | ORAL_TABLET | Freq: Every evening | ORAL | Status: DC | PRN
  Administered 2014-07-30 – 2014-08-08 (×7): 5 mg via ORAL
  Filled 2014-07-29 (×7): qty 1

## 2014-07-29 MED ORDER — DOCUSATE SODIUM 100 MG PO CAPS
100.0000 mg | ORAL_CAPSULE | Freq: Every day | ORAL | Status: DC
  Administered 2014-07-30 – 2014-08-09 (×11): 100 mg via ORAL
  Filled 2014-07-29 (×11): qty 1

## 2014-07-29 MED ORDER — AZITHROMYCIN 250 MG PO TABS
1000.0000 mg | ORAL_TABLET | ORAL | Status: AC
  Administered 2014-07-29 – 2014-07-30 (×2): 1000 mg via ORAL
  Filled 2014-07-29 (×2): qty 4

## 2014-07-29 NOTE — Progress Notes (Signed)
Dr. Adrian BlackwaterStinson @ bedside performing speculum exam, GC/Chlamydia & GBS cultures obtained.

## 2014-07-29 NOTE — Progress Notes (Signed)
Spoke with ED MD. [facility]. Dr. Adrian BlackwaterStinson is the attending provider there.
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
## 2014-07-29 NOTE — H&P (Addendum)
Faculty Practice Antenatal History and Physical  Judy Wood GNF:[id number] DOB: 27-Sep-1995 DOA: 07/29/2014  Chief Complaint: Vaginal bleeding  HPI: Judy Wood is a 19 y.o. female G2P1001 with IUP at [redacted]w[redacted]d presenting for profuse vaginal bleeding that started earlier today. The patient woke up around noon and had profuse bright red vaginal bleeding like "she was pain on herself". The bleeding did not appear to be watery but was bright red. The patient went to the [facility] emergency department for evaluation. During her time at the emergency department, her bleeding slowed down. No other provoking or palliating factors to her vaginal bleeding. Spoke with the ER physician who discussed her care with me and agreed to admit the patient. Currently, her bleeding is fairly minimal and is having staining on the pad. In reviewing the chart, her obstetrical history has been complicated by well-controlled chronic hypertension, previous C-section for arrest of dilation and descent, and obesity.  Her last ultrasound was on 06/30/2014 showing 1 lb. 3 oz. baby (532 g).   Review of Systems:   Pt denies any contractions, decreased fetal movement, abdominal pain, nausea, vomiting, diarrhea, constipation, dizziness, lightheadedness, headaches.  Review of systems are otherwise negative  Prenatal History/Complications: Chronic hypertension, obesity, prior section  Past Medical History: Past Medical History  Diagnosis Date  . Hypertension     on meds  . Hernia, umbilical   . Hypertension   . H/O umbilical hernia repair   . Asthma     no current meds needed    Past Surgical History: Past Surgical History  Procedure Laterality Date  . Umbilical hernia repair      as a child  . Umbilical hernia repair    . Cesarean section N/A 05/19/2012    Procedure:  Primary CESAREAN SECTION  of baby girl  at 1933  APGAR 4/5/5;  Surgeon: Adam Phenix, MD;  Location: [facility] ORS;  Service: Obstetrics;  Laterality: N/A;     Obstetrical History: OB History    Gravida Para Term Preterm AB TAB SAB Ectopic Multiple Living   Social History: History   Social History  . Marital Status: Single    Spouse Name: N/A  . Number of Children: N/A  . Years of Education: N/A   Social History Main Topics  . Smoking status: Light Tobacco Smoker -- 0.25 packs/day    Types: Cigarettes  . Smokeless tobacco: Never Used  . Alcohol Use: No     Comment: Stopped smoking marijuana 3 weeks ago  . Drug Use: Yes    Special: Marijuana     Comment: Stopped when she found out she was pregnant   . Sexual Activity: Yes    Birth Control/ Protection: None, Pill   Other Topics Concern  . None   Social History Narrative    Family History: Family History  Problem Relation Age of Onset  . Hypertension Mother   . Anemia Mother   . Depression Mother   . Deep vein thrombosis Maternal Grandmother   . Diabetes Maternal Grandmother   . Kidney disease Maternal Grandmother   . Cancer Maternal Grandmother     colon cancer  . Diabetes Father     Allergies: Allergies  Allergen Reactions  . Banana Anaphylaxis  . Lactose Intolerance (Gi) Diarrhea and Nausea Only  . Sulfa Antibiotics Other (See Comments)    Childhood reaction    Prescriptions prior to admission  Medication Sig Dispense Refill Last Dose  . acetaminophen (TYLENOL) 325 MG tablet Take 650 mg by mouth every 6 (six) hours as needed (pain).   Past Week at Unknown time  . ibuprofen (ADVIL,MOTRIN) 200 MG tablet Take 200 mg by mouth every 6 (six) hours as needed (pain).   Past Week at Unknown time  . prenatal vitamin w/FE, FA (PRENATAL 1 + 1) 27-1 MG TABS tablet Take 1 tablet by mouth daily at 12 noon.   07/28/2014 at Unknown time  . aspirin EC 81 MG tablet Take 1 tablet (81 mg total) by mouth daily. (Patient not taking: Reported on 07/29/2014) 90 tablet 3 Not Taking at Unknown time  . labetalol (NORMODYNE) 100 MG tablet Take 50 mg by mouth 2  (two) times daily.  0 Unknown at Unknown time    Physical Exam: BP 141/77 mmHg  Pulse 81  Temp(Src) 98.4 F (36.9 C) (Oral)  Resp 20  Ht  (1.6 m)  Wt 250 lb (113.399 kg)  BMI 44.30 kg/m2  SpO2 95%  LMP 01/04/2014  General appearance: alert, cooperative and no distress Head: Normocephalic, without obvious abnormality, atraumatic, Right and left external ears normal Eyes: conjunctivae/corneas clear. PERRL, EOM's intact. Fundi benign. Neck: no adenopathy, no carotid bruit, no JVD, supple, symmetrical, trachea midline and thyroid not enlarged, symmetric, no tenderness/mass/nodules Lungs: clear to auscultation bilaterally Heart: regular rate and rhythm, S1, S2 normal, no murmur, click, rub or gallop Abdomen: soft, non-tender; bowel sounds normal; no masses,  no organomegaly and Gravid, measuring approximately 22 weeks Pelvic: cervix normal in appearance, external genitalia normal, no adnexal masses or tenderness, no cervical motion tenderness, rectovaginal septum normal, uterus normal size, shape, and consistency and Blood in vaginal vault. No pooling Extremities: extremities normal, atraumatic, no cyanosis or edema Pulses: 2+ and symmetric Skin: Skin color, texture, turgor normal. No rashes or lesions breech Baseline: 150 bpm, Variability: Good (6-26 bpm), Accelerations: Non-reactive but appropriate for gestational age and Decelerations: Absent None             Labs on Admission:  Basic Metabolic Panel: No results for input(s): NA, K, CL, CO2, GLUCOSE, BUN, CREATININE, CALCIUM, MG, PHOS in the last 168 hours. Liver Function Tests: No results for input(s): AST, ALT, ALKPHOS, BILITOT, PROT, ALBUMIN in the last 168 hours. No results for input(s): LIPASE, AMYLASE in the last 168 hours. No results for input(s): AMMONIA in the last 168 hours. CBC:  Recent Labs Lab 07/29/14 1336  WBC 9.2  HGB 10.5*  HCT 31.7*  MCV 86.6  PLT 329    CBG: No results for input(s): GLUCAP  in the last 168 hours.  Radiological Exams on Admission: US Ob Follow Up  Ultrasound done today shows severe IUGR with no fluid around the baby. The baby is footling breech position. No placental abnormalities including gross abruption, placenta previa. Baby's weight's is 652 g (1 lb. 7 oz.), Which is the 15th percentile, with AC and the 5th percentile.  Umbilical artery Dopplers were normal for gestational age.   Assessment/Plan Present on Admission:   Patient Active Problem List   Diagnosis Date Noted  . Chronic hypertension complicating or reason for care during pregnancy 04/22/2014    Priority: High  . Vaginal bleeding in pregnancy 07/29/2014  . Oligohydramnios in third trimester, antepartum 07/29/2014  . IUGR (intrauterine growth restriction) affecting care of mother 07/29/2014  . Breech presentation 07/29/2014  . Obesity affecting pregnancy   . Prior poor obstetrical history in second trimester, antepartum   .  Previous cesarean delivery, antepartum 05/23/2012  . Hypertension 10/17/2011   #1 vaginal bleeding pregnancy #2 severe oligohydramnios #3 IUGR #4 footling breech presentation #5 chronic hypertension #6 intrauterine pregnancy at 26 weeks 2 days  I personally examined the ultrasound pictures revealing absent fluid around the baby.  Was not able to appreciate ferning on slide collected by myself, however due to the severity of the patient's oligohydramnios, and conversations with maternal-fetal medicine, I will start the patient latency anti-biotics to cover the patient for P PROM. In any sure was not done as blood can cause a false positive. We'll need to reexamine the patient at a future point to determine whether truly ruptured  Terms of the IUGR, vaginal bleeding, and severe oligohydramnios - will continuously monitor the patient initially. Patient will need weekly BPP's, AFI, cord Dopplers, and growth ultrasound every other week as recommended by MFM.  Betamethasone  given to the patient today. Will need a second dose tomorrow afternoon  We'll recheck CBC in the morning GC/Chlamydia and GBS were obtained.  As the patient has had a prior C-section and is footling breech, the patient will need a repeat C-section should there be signs and symptoms of intrauterine infection or fetal distress. This was discussed with the patient.  Code Status: Full code  Family Communication: None   Disposition Plan: Pending    Levie HeritageJacob J Stinson, DO 07/29/2014 7:05 PM Faculty Practice Attending Physician [facility] Attending Phone #: [phone]

## 2014-07-29 NOTE — Progress Notes (Signed)
[facility] charge nurse, Antenatal. Pt is to go to room 159.
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
## 2014-07-29 NOTE — ED Provider Notes (Signed)
CSN: [id number]     Arrival date & time 07/29/14  1313 History   First MD Initiated Contact with Patient 07/29/14 1353     Chief Complaint  Patient presents with  . Vaginal Bleeding    pregnant     (Consider location/radiation/quality/duration/timing/severity/associated sxs/prior Treatment) HPI Patient developed blood per vagina this morning, continuous leakage. She feels as if she may have blood in her urine. She developed abdominal cramping while here a few minutes ago which is mild diffuse and constant. No treatment prior to coming here. Patient presently pregnant. [facility] 10/26/2014. No other associated symptoms Past Medical History  Diagnosis Date  . Hypertension     on meds  . Hernia, umbilical   . Hypertension   . H/O umbilical hernia repair   . Asthma     no current meds needed   Past Surgical History  Procedure Laterality Date  . Umbilical hernia repair      as a child  . Umbilical hernia repair    . Cesarean section N/A 05/19/2012    Procedure:  Primary CESAREAN SECTION  of baby girl  at 1933  APGAR 4/5/5;  Surgeon: Adam Phenix, MD;  Location: [facility];  Service: Obstetrics;  Laterality: N/A;   Family History  Problem Relation Age of Onset  . Hypertension Mother   . Anemia Mother   . Depression Mother   . Deep vein thrombosis Maternal Grandmother   . Diabetes Maternal Grandmother   . Kidney disease Maternal Grandmother   . Cancer Maternal Grandmother     colon cancer  . Diabetes Father    History  Substance Use Topics  . Smoking status: Light Tobacco Smoker -- 0.25 packs/day    Types: Cigarettes  . Smokeless tobacco: Never Used  . Alcohol Use: No    no illicit drug use OB History    Gravida Para Term Preterm AB TAB SAB Ectopic Multiple Living   Review of Systems  Constitutional: Negative.   HENT: Negative.   Respiratory: Negative.   Cardiovascular: Negative.   Gastrointestinal: Positive for abdominal pain.  Genitourinary: Positive  for hematuria and vaginal bleeding.       Pregnant  Musculoskeletal: Negative.   Skin: Negative.   Neurological: Negative.   Psychiatric/Behavioral: Negative.   All other systems reviewed and are negative.     Allergies  Banana; Lactose intolerance (gi); and Sulfa antibiotics  Home Medications   Prior to Admission medications   Medication Sig Start Date End Date Taking? Authorizing Provider  aspirin EC 81 MG tablet Take 1 tablet (81 mg total) by mouth daily. 07/21/14   Levie Heritage, DO  prenatal vitamin w/FE, FA (PRENATAL 1 + 1) 27-1 MG TABS tablet Take 1 tablet by mouth daily at 12 noon.    Historical Provider, MD  PROCTOZONE-HC 2.5 % rectal cream  05/22/14   Historical Provider, MD   BP 179/86 mmHg  Pulse 110  Temp(Src) 98.3 F (36.8 C) (Oral)  Resp 20  Ht  (1.6 m)  Wt 250 lb (113.399 kg)  BMI 44.30 kg/m2  SpO2 98%  LMP 01/04/2014 Physical Exam  Constitutional: She is oriented to person, place, and time. She appears well-developed and well-nourished.  HENT:  Head: Normocephalic and atraumatic.  Eyes: Conjunctivae are normal. Pupils are equal, round, and reactive to light.  Neck: Neck supple. No tracheal deviation present. No thyromegaly present.  Cardiovascular: Normal rate and  regular rhythm.   No murmur heard. Pulmonary/Chest: Effort normal and breath sounds normal.  Abdominal: Soft. Bowel sounds are normal. She exhibits no distension. There is no tenderness.  Gravid fetal heart tones 148  Genitourinary:  GU exam not performed  Musculoskeletal: Normal range of motion. She exhibits no edema or tenderness.  Neurological: She is alert and oriented to person, place, and time. Coordination normal.  Skin: Skin is warm and dry. No rash noted.  Psychiatric: She has a normal mood and affect.  Nursing note and vitals reviewed.   ED Course  Procedures (including critical care time) Labs Review Labs Reviewed  CBC - Abnormal; Notable for the following:    RBC  3.66 (*)    Hemoglobin 10.5 (*)    HCT 31.7 (*)    All other components within normal limits  TYPE AND SCREEN    Imaging Review No results found.   EKG Interpretation None       Results for orders placed or performed during the hospital encounter of 07/29/14  CBC (add if pt pregnant)  Result Value Ref Range   WBC 9.2 4.0 - 10.5 K/uL   RBC 3.66 (L) 3.87 - 5.11 MIL/uL   Hemoglobin 10.5 (L) 12.0 - 15.0 g/dL   HCT 98.131.7 (L) 19.136.0 - 47.846.0 %   MCV 86.6 78.0 - 100.0 fL   MCH 28.7 26.0 - 34.0 pg   MCHC 33.1 30.0 - 36.0 g/dL   RDW 29.513.0 62.111.5 - 30.815.5 %   Platelets 329 150 - 400 K/uL  Type and screen  Result Value Ref Range   ABO/RH(D) A POS    Antibody Screen PENDING    Sample Expiration 08/01/2014    Dg Chest 2 View  07/14/2014   CLINICAL DATA:  19 year old female with chest pain  EXAM: CHEST  2 VIEW  COMPARISON:  None.  FINDINGS: The lungs are clear and negative for focal airspace consolidation, pulmonary edema or suspicious pulmonary nodule. No pleural effusion or pneumothorax. Cardiac and mediastinal contours are within normal limits. No acute fracture or lytic or blastic osseous lesions. The visualized upper abdominal bowel gas pattern is unremarkable.  IMPRESSION: No active cardiopulmonary disease.   Electronically Signed   By: Malachy MoanHeath  McCullough M.D.   On: 07/14/2014 22:17   [place] Ob Detail + 14 Wk  06/30/2014   OBSTETRICAL ULTRASOUND: This exam was performed within a [place] [facility]. The OB US report was generated in the [facility] system, and faxed to the ordering physician.   This report is available in the [employer] PACS. See the [facility] Obstetric US report via the Image Link.    MDM   Final diagnoses:  None    spoke with Dr. Adrian BlackwaterStinson. Plan [facility] Diagnosis bleeding during pregnancy   Doug SouSam Danyl Deems, MD 07/29/14 1434

## 2014-07-29 NOTE — Progress Notes (Signed)
Ultrasonographer @ bedside performing ultrasound.
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
## 2014-07-29 NOTE — Progress Notes (Signed)
Care Link here. [facility].

## 2014-07-30 LAB — CBC
HCT: 28.6 % — ABNORMAL LOW (ref 36.0–46.0)
Hemoglobin: 9.6 g/dL — ABNORMAL LOW (ref 12.0–15.0)
MCH: 29.4 pg (ref 26.0–34.0)
MCHC: 33.6 g/dL (ref 30.0–36.0)
MCV: 87.5 fL (ref 78.0–100.0)
Platelets: 307 10*3/uL (ref 150–400)
RBC: 3.27 MIL/uL — ABNORMAL LOW (ref 3.87–5.11)
RDW: 13.1 % (ref 11.5–15.5)
WBC: 13.4 10*3/uL — ABNORMAL HIGH (ref 4.0–10.5)

## 2014-07-30 LAB — GC/CHLAMYDIA PROBE AMP (~~LOC~~) NOT AT ARMC
CHLAMYDIA, DNA PROBE: NEGATIVE
Neisseria Gonorrhea: POSITIVE — AB

## 2014-07-30 NOTE — Plan of Care (Signed)
Problem: Consults Goal: Birthing Suites Patient Information Press F2 to bring up selections list   Pt < [redacted] weeks EGA     

## 2014-07-30 NOTE — Progress Notes (Signed)
Patient ID: [name], female   DOB: Nov 24, 1995, 19 y.o.   MRN: [id number] FACULTY PRACTICE ANTEPARTUM(COMPREHENSIVE) NOTE  CELISSE CIULLA is a 19 y.o. G2P1001 at [redacted]w[redacted]d by early ultrasound who is admitted for probable PPROM, IUGR, oligohydramnios, vaginal bleeding.   Fetal presentation is breech. BP is up and down. Length of Stay:  1  Days  Subjective: Denies pain.   Continues to have bleeding, small amount. Patient reports the fetal movement as active. Patient reports uterine contraction  activity as none. Patient reports  vaginal bleeding as spotting. Patient describes fluid per vagina as Clear.  Vitals:  Blood pressure 125/82, pulse 89, temperature 98.1 F (36.7 C), temperature source Oral, resp. rate 18, height  (1.6 m), weight 251 lb 12.8 oz (114.216 kg), last menstrual period 01/04/2014, SpO2 95 %. Physical Examination:  General appearance - alert, well appearing, and in no distress Abdomen - gravid, NT Fundal Height:  size equals dates Extremities: extremities normal, atraumatic, no cyanosis or edema  Membranes:ruptured  Fetal Monitoring:  Baseline: 150 bpm, Variability: Good {> 6 bpm), Accelerations: Reactive and Decelerations: Variable: mild  Labs:  Results for orders placed or performed during the hospital encounter of 07/29/14 (from the past 24 hour(s))  CBC (add if pt pregnant)   Collection Time: 07/29/14  1:36 PM  Result Value Ref Range   WBC 9.2 4.0 - 10.5 K/uL   RBC 3.66 (L) 3.87 - 5.11 MIL/uL   Hemoglobin 10.5 (L) 12.0 - 15.0 g/dL   HCT 40.9 (L) 81.1 - 91.4 %   MCV 86.6 78.0 - 100.0 fL   MCH 28.7 26.0 - 34.0 pg   MCHC 33.1 30.0 - 36.0 g/dL   RDW 78.2 95.6 - 21.3 %   Platelets 329 150 - 400 K/uL  Type and screen   Collection Time: 07/29/14  1:36 PM  Result Value Ref Range   ABO/RH(D) A POS    Antibody Screen NEG    Sample Expiration 08/01/2014   ABO/Rh   Collection Time: 07/29/14  1:36 PM  Result Value Ref Range   ABO/RH(D) A POS   Group B strep  by PCR   Collection Time: 07/29/14  6:25 PM  Result Value Ref Range   Group B strep by PCR POSITIVE (A) NEGATIVE  CBC   Collection Time: 07/30/14  5:10 AM  Result Value Ref Range   WBC 13.4 (H) 4.0 - 10.5 K/uL   RBC 3.27 (L) 3.87 - 5.11 MIL/uL   Hemoglobin 9.6 (L) 12.0 - 15.0 g/dL   HCT 08.6 (L) 57.8 - 46.9 %   MCV 87.5 78.0 - 100.0 fL   MCH 29.4 26.0 - 34.0 pg   MCHC 33.6 30.0 - 36.0 g/dL   RDW 62.9 52.8 - 41.3 %   Platelets 307 150 - 400 K/uL     Medications:  Scheduled . ampicillin (OMNIPEN) IV  2 g Intravenous Q6H   Followed by  . [START ON 07/31/2014] amoxicillin  500 mg Oral 3 times per day  . aspirin EC  81 mg Oral Daily  . azithromycin  1,000 mg Oral Q24 Hr x 2  . betamethasone acetate-betamethasone sodium phosphate  12 mg Intramuscular Q24H  . docusate sodium  100 mg Oral Daily  . labetalol  50 mg Oral BID  . prenatal multivitamin  1 tablet Oral Q1200  . sodium chloride  3 mL Intravenous Q12H   I have reviewed the patient's current medications.  ASSESSMENT: Principal Problem:   Oligohydramnios in  third trimester, antepartum Active Problems:   Previous cesarean delivery, antepartum   Chronic hypertension complicating or reason for care during pregnancy   Vaginal bleeding in pregnancy   IUGR (intrauterine growth restriction) affecting care of mother   Breech presentation Anemia-acute on chronic blood loss PLAN: Continue inpatient management. Latency antibiotics. If stops bleeding, attempt to confirm PPROM. 2nd BMZ today Weekly BPP Continuous fetal monitoring Delivery with s/sx's infection or uncontrolled bleeding--via C-section due to breech. Monitor hgb.  Reva BoresPRATT,Corrigan Kretschmer S, MD 07/30/2014,9:45 AM

## 2014-07-31 MED ORDER — CEFTRIAXONE SODIUM 250 MG IJ SOLR
250.0000 mg | Freq: Once | INTRAMUSCULAR | Status: AC
  Administered 2014-07-31: 250 mg via INTRAMUSCULAR
  Filled 2014-07-31: qty 250

## 2014-07-31 MED ORDER — MAGNESIUM SULFATE 50 % IJ SOLN
2.0000 g/h | INTRAVENOUS | Status: DC
  Filled 2014-07-31: qty 80

## 2014-07-31 MED ORDER — LACTATED RINGERS IV SOLN
INTRAVENOUS | Status: DC
Start: 2014-07-31 — End: 2014-08-01
  Administered 2014-07-31 – 2014-08-01 (×2): via INTRAVENOUS

## 2014-07-31 MED ORDER — MAGNESIUM SULFATE BOLUS VIA INFUSION
6.0000 g | Freq: Once | INTRAVENOUS | Status: AC
  Administered 2014-07-31: 6 g via INTRAVENOUS
  Filled 2014-07-31: qty 500

## 2014-07-31 NOTE — Progress Notes (Signed)
MgS04 6gm LD/2gm/hr X 12 hours for neurprotection ordered.  

## 2014-08-01 DIAGNOSIS — O4692 Antepartum hemorrhage, unspecified, second trimester: Secondary | ICD-10-CM

## 2014-08-01 DIAGNOSIS — O3421 Maternal care for scar from previous cesarean delivery: Secondary | ICD-10-CM

## 2014-08-01 DIAGNOSIS — O4690 Antepartum hemorrhage, unspecified, unspecified trimester: Secondary | ICD-10-CM

## 2014-08-01 LAB — TYPE AND SCREEN
ABO/RH(D): A POS
Antibody Screen: NEGATIVE

## 2014-08-01 MED ORDER — SODIUM CHLORIDE 0.9 % IJ SOLN: 3.0000 mL | Freq: Two times a day (BID) | INTRAMUSCULAR | Status: DC

## 2014-08-01 NOTE — Progress Notes (Signed)
Patient ID: Judy Wood, female   DOB: 12/31/1995, 19 y.o.   MRN: [id number] ACULTY PRACTICE ANTEPARTUM COMPREHENSIVE PROGRESS NOTE  Judy Wood is a 19 y.o. G2P1001 at 3965w5d  who is admitted for PROM.   Fetal presentation is breech. Length of Stay:  3  Days  Subjective: Pt denies pain or LOF/bleeding since last night.   Patient reports good fetal movement.  She reports no uterine contractions.  Vitals:  Blood pressure 129/59, pulse 82, temperature 98.4 F (36.9 C), temperature source Oral, resp. rate 20, height 5\' 3"  (1.6 m), weight 251 lb 12.8 oz (114.216 kg), last menstrual period 01/04/2014, SpO2 95 %. Physical Examination: General appearance - alert, well appearing, and in no distress Abdomen - soft, nontender, nondistended, no masses or organomegaly Extremities - peripheral pulses normal, no pedal edema, no clubbing or cyanosis Cervical Exam: Not evaluated.  Membranes:suspected rupture  Fetal Monitoring:  Baseline: 150's bpm, Variability: Good {> 6 bpm), Accelerations: Reactive, Decelerations: Absent and Toco: no contractions  Labs:  No results found for this or any previous visit (from the past 24 hour(s)).  Imaging Studies:    None pending   Medications:  Scheduled . amoxicillin  500 mg Oral 3 times per day  . aspirin EC  81 mg Oral Daily  . docusate sodium  100 mg Oral Daily  . labetalol  50 mg Oral BID  . prenatal multivitamin  1 tablet Oral Q1200  . sodium chloride  3 mL Intravenous Q12H   I have reviewed the patient's current medications.  ASSESSMENT: Patient Active Problem List   Diagnosis Date Noted  . Vaginal bleeding in pregnancy 07/29/2014  . Oligohydramnios in third trimester, antepartum 07/29/2014  . IUGR (intrauterine growth restriction) affecting care of mother 07/29/2014  . Breech presentation 07/29/2014  . Obesity affecting pregnancy   . Prior poor obstetrical history in second trimester, antepartum   . Chronic hypertension complicating or  reason for care during pregnancy 04/22/2014  . Previous cesarean delivery, antepartum 05/23/2012  . Hypertension 10/17/2011    PLAN: Continue inpatient management. Latency antibiotics.  Weekly BPP Continuous fetal monitoring Delivery with s/sx's infection or uncontrolled bleeding--via C-section due to breech. Monitor hgb. Continue routine antenatal care. Magnesium sulfate to complete 12 hours for neuroprotection   HARRAWAY-SMITH, Delfina Schreurs 08/01/2014,8:21 AM

## 2014-08-01 NOTE — Progress Notes (Signed)
I began this note when I saw pt on 6/3.  On that day, I spent time with Fatma who was very appreciative of having an opportunity to speak.  Chyrel MassonCierra is managing some family dynamics and trying to manage her emotions as well.  She is frustrated being here, but is beginning to get into a routine.  She does not like to be alone and it is very hard for her being up here alone.  She has family support, but some of the relationships have been strained lately.    Today's conversation was a continuation of last week's.  She has been having more pain physically, which has been frustrating.  She is paying attention to her family dynamics and her own emotional dynamics better and she has been working, in particular, on stress and anger management.   I asked her if she would like to see a Child psychotherapistsocial worker and she stated that that would be helpful.  I will speak with Social work and we will work together to continue to support her.  [id number]Chaplain Katy [place], Pager, [phone] 4:03 PM

## 2014-08-02 DIAGNOSIS — O10912 Unspecified pre-existing hypertension complicating pregnancy, second trimester: Secondary | ICD-10-CM

## 2014-08-02 DIAGNOSIS — O365921 Maternal care for other known or suspected poor fetal growth, second trimester, fetus 1: Secondary | ICD-10-CM

## 2014-08-02 DIAGNOSIS — I10 Essential (primary) hypertension: Secondary | ICD-10-CM

## 2014-08-02 DIAGNOSIS — O4690 Antepartum hemorrhage, unspecified, unspecified trimester: Secondary | ICD-10-CM

## 2014-08-02 LAB — ABO/RH: ABO/RH(D): A POS

## 2014-08-02 NOTE — Plan of Care (Signed)
Problem: Phase I Progression Outcomes Goal: No significant worsening bleeding/cervix change/vag drainage No significant worsening in vaginal bleeding, cervical change, or vaginal drainage.  Outcome: Progressing No vaginal bleeding or discharge noted.

## 2014-08-02 NOTE — Progress Notes (Signed)
Patient ID: Judy Wood, female   DOB: 06/25/1995, 19 y.o.   MRN: [id number]  FACULTY PRACTICE ANTEPARTUM NOTE  Judy Wood is a 19 y.o. G2P1001 at 3314w6d  who is admitted for PPROM and vaginal bleeding.   Fetal presentation is breech. Length of Stay:  4  Days  Subjective:  No bright red bleeding - having some brownish discharge. Tolerating antibiotics. Patient reports good fetal movement.  She reports no uterine contractions, no bleeding and no loss of fluid per vagina.  No other complaints  Vitals:  Blood pressure 135/71, pulse 92, temperature 98.5 F (36.9 C), temperature source Oral, resp. rate 18, height 5\' 3"  (1.6 m), weight 251 lb 12.8 oz (114.216 kg), last menstrual period 01/04/2014, SpO2 95 %. Physical Examination:  General appearance - alert, well appearing, and in no distress Chest - clear to auscultation, no wheezes, rales or rhonchi, symmetric air entry Heart - normal rate, regular rhythm, normal S1, S2, no murmurs, rubs, clicks or gallops Abdomen - soft, nontender, nondistended, no masses or organomegaly Extremities: extremities normal, atraumatic, no cyanosis or edema  Membranes: suspected rupture  Fetal Monitoring:  Baseline: 145 bpm, Variability: Good {> 6 bpm), Accelerations: Reactive and Decelerations: Absent  Labs:  Results for orders placed or performed during the hospital encounter of 07/29/14 (from the past 24 hour(s))  Type and screen   Collection Time: 08/01/14  7:44 PM  Result Value Ref Range   ABO/RH(D) A POS    Antibody Screen NEG    Sample Expiration 08/04/2014   ABO/Rh   Collection Time: 08/01/14  7:44 PM  Result Value Ref Range   ABO/RH(D) A POS     Imaging Studies:    none   Medications:  Scheduled . amoxicillin  500 mg Oral 3 times per day  . aspirin EC  81 mg Oral Daily  . docusate sodium  100 mg Oral Daily  . labetalol  50 mg Oral BID  . prenatal multivitamin  1 tablet Oral Q1200  . sodium chloride  3 mL Intravenous Q12H   I have  reviewed the patient's current medications.  ASSESSMENT: Patient Active Problem List   Diagnosis Date Noted  . Chronic hypertension complicating or reason for care during pregnancy 04/22/2014    Priority: High  . Vaginal bleeding during pregnancy, antepartum   . Vaginal bleeding in pregnancy 07/29/2014  . Oligohydramnios in third trimester, antepartum 07/29/2014  . IUGR (intrauterine growth restriction) affecting care of mother 07/29/2014  . Breech presentation 07/29/2014  . Obesity affecting pregnancy   . Prior poor obstetrical history in second trimester, antepartum   . Previous cesarean delivery, antepartum 05/23/2012  . Hypertension 10/17/2011   1.  CHTN 2.  IUGR 3.  Vaginal bleeding 4.  PPROM 5.  IUP at 27 weeks  PLAN: Continue Labetalol - BP stable No further vaginal bleeding Continue latency antibiotics NSTs reactive Delivery for maternal or fetal concern. Continue routine antenatal care.   Levie HeritageJacob J Stinson, DO 08/02/2014,7:19 AM

## 2014-08-03 MED ORDER — SALINE SPRAY 0.65 % NA SOLN
1.0000 | NASAL | Status: DC | PRN
  Administered 2014-08-03: 1 via NASAL
  Filled 2014-08-03: qty 44

## 2014-08-03 NOTE — Plan of Care (Signed)
Problem: Phase II Progression Outcomes Goal: Electronic fetal monitoring(Doppler,Continuous,Intermittent) EFM (Doppler, Continuous, Intermittent)  Outcome: Progressing NST TID.     

## 2014-08-03 NOTE — Progress Notes (Signed)
FACULTY PRACTICE ANTEPARTUM(COMPREHENSIVE) NOTE  Judy Wood is a 19 y.o. G2P1001 at 659w0d by US who is admitted for rupture of membranes.   Fetal presentation is breech. Length of Stay:  5  Days  Subjective:  Patient reports the fetal movement as active. Patient reports uterine contraction  activity as none. Patient reports  vaginal bleeding as none. Patient describes fluid per vagina as None.  Vitals:  Blood pressure 149/76, pulse 80, temperature 98.4 F (36.9 C), temperature source Oral, resp. rate 20, height 5\' 4"  (1.626 m), weight 113.853 kg (251 lb), last menstrual period 01/04/2014, SpO2 95 %. Physical Examination:  General appearance - alert, well appearing, and in no distress Heart - normal rate and regular rhythm Abdomen - soft, nontender, nondistended Fundal Height:  size equals dates Cervical Exam: Not evaluated. Extremities: extremities normal, atraumatic, no cyanosis or edema and Homans sign is negative, no sign of DVT  Membranes:ruptured  Fetal Monitoring:  Fetal Heart Rate A      Mode  External [EFM off] filed at 08/02/2014 2305    Baseline Rate (A)  145 bpm filed at 08/02/2014 2305    Variability  6-25 BPM filed at 08/02/2014 2305    Accelerations  15 x 15 filed at 08/02/2014 2305    Decelerations  None filed at 08/02/2014 2305        Labs:  No results found for this or any previous visit (from the past 24 hour(s)).    Medications:  Scheduled . amoxicillin  500 mg Oral 3 times per day  . aspirin EC  81 mg Oral Daily  . docusate sodium  100 mg Oral Daily  . labetalol  50 mg Oral BID  . prenatal multivitamin  1 tablet Oral Q1200  . sodium chloride  3 mL Intravenous Q12H   I have reviewed the patient's current medications.  ASSESSMENT: Patient Active Problem List   Diagnosis Date Noted  . Vaginal bleeding during pregnancy, antepartum   . Vaginal bleeding in pregnancy 07/29/2014  . Oligohydramnios in third trimester, antepartum  07/29/2014  . IUGR (intrauterine growth restriction) affecting care of mother 07/29/2014  . Breech presentation 07/29/2014  . Obesity affecting pregnancy   . Prior poor obstetrical history in second trimester, antepartum   . Chronic hypertension complicating or reason for care during pregnancy 04/22/2014  . Previous cesarean delivery, antepartum 05/23/2012  . Hypertension 10/17/2011    PLAN: Latency antibiotics continue, watch for s/sx of infection, fetal distress and labor  Zyshonne Malecha 08/03/2014,9:23 AM

## 2014-08-04 ENCOUNTER — Inpatient Hospital Stay (HOSPITAL_COMMUNITY): Payer: Medicaid Other

## 2014-08-04 DIAGNOSIS — O4103X0 Oligohydramnios, third trimester, not applicable or unspecified: Secondary | ICD-10-CM

## 2014-08-04 DIAGNOSIS — IMO0002 Reserved for concepts with insufficient information to code with codable children: Secondary | ICD-10-CM

## 2014-08-04 LAB — TYPE AND SCREEN
ABO/RH(D): A POS
Antibody Screen: NEGATIVE

## 2014-08-04 NOTE — Progress Notes (Signed)
Patient ID: Judy Bowensierra D Prada, female   DOB: 05/10/1995, 19 y.o.   MRN: [id number] ACULTY PRACTICE ANTEPARTUM COMPREHENSIVE PROGRESS NOTE  Judy Wood is a 19 y.o. G2P1001 at 6187w1d  who is admitted for PROM.   Fetal presentation is breech. Length of Stay:  6  Days  Subjective: Pt denies problems.  She denies continued LOF or VB Patient reports good fetal movement.  She reports no uterine contractions.  Vitals:  Blood pressure 123/75, pulse 90, temperature 98.1 F (36.7 C), temperature source Oral, resp. rate 18, height 5\' 4"  (1.626 m), weight 251 lb (113.853 kg), last menstrual period 01/04/2014, SpO2 95 %. Physical Examination: General appearance - alert, well appearing, and in no distress and oriented to person, place, and time Abdomen - soft, nontender, nondistended, no masses or organomegaly gravid Extremities - peripheral pulses normal, no pedal edema, no clubbing or cyanosis Cervical Exam: Not evaluated.  Membranes:ruptured  Fetal Monitoring:  Baseline: 140's-150's bpm, Variability: Good {> 6 bpm), Accelerations: Non-reactive but appropriate for gestational age and Decelerations: Absent Toco: no contractions.,  Labs:  No results found for this or any previous visit (from the past 24 hour(s)).  Imaging Studies:    BPP with MFM consult today   Medications:  Scheduled . amoxicillin  500 mg Oral 3 times per day  . aspirin EC  81 mg Oral Daily  . docusate sodium  100 mg Oral Daily  . labetalol  50 mg Oral BID  . prenatal multivitamin  1 tablet Oral Q1200  . sodium chloride  3 mL Intravenous Q12H   I have reviewed the patient's current medications.  ASSESSMENT: Patient Active Problem List   Diagnosis Date Noted  . Vaginal bleeding during pregnancy, antepartum   . Vaginal bleeding in pregnancy 07/29/2014  . Oligohydramnios in third trimester, antepartum 07/29/2014  . IUGR (intrauterine growth restriction) affecting care of mother 07/29/2014  . Breech presentation 07/29/2014   . Obesity affecting pregnancy   . Prior poor obstetrical history in second trimester, antepartum   . Chronic hypertension complicating or reason for care during pregnancy 04/22/2014  . Previous cesarean delivery, antepartum 05/23/2012  . Hypertension 10/17/2011    PLAN: Completing latency atbx today BPP with MFM consult today  Continue routine antenatal care.   HARRAWAY-SMITH, Dotty Gonzalo 08/04/2014,11:11 AM

## 2014-08-05 ENCOUNTER — Inpatient Hospital Stay (HOSPITAL_COMMUNITY): Payer: Medicaid Other

## 2014-08-05 DIAGNOSIS — O42919 Preterm premature rupture of membranes, unspecified as to length of time between rupture and onset of labor, unspecified trimester: Secondary | ICD-10-CM

## 2014-08-05 LAB — CBC
HEMATOCRIT: 28.6 % — AB (ref 36.0–46.0)
Hemoglobin: 9.4 g/dL — ABNORMAL LOW (ref 12.0–15.0)
MCH: 29.4 pg (ref 26.0–34.0)
MCHC: 32.9 g/dL (ref 30.0–36.0)
MCV: 89.4 fL (ref 78.0–100.0)
PLATELETS: 309 10*3/uL (ref 150–400)
RBC: 3.2 MIL/uL — AB (ref 3.87–5.11)
RDW: 13.8 % (ref 11.5–15.5)
WBC: 14.5 10*3/uL — AB (ref 4.0–10.5)

## 2014-08-05 MED ORDER — OXYCODONE-ACETAMINOPHEN 5-325 MG PO TABS
1.0000 | ORAL_TABLET | Freq: Three times a day (TID) | ORAL | Status: DC | PRN
  Administered 2014-08-05 – 2014-08-08 (×5): 1 via ORAL
  Filled 2014-08-05 (×5): qty 1

## 2014-08-05 MED ORDER — CYCLOBENZAPRINE HCL 5 MG PO TABS
5.0000 mg | ORAL_TABLET | Freq: Three times a day (TID) | ORAL | Status: DC | PRN
  Administered 2014-08-05 – 2014-08-07 (×2): 5 mg via ORAL
  Filled 2014-08-05 (×3): qty 1

## 2014-08-05 NOTE — Progress Notes (Signed)
Patient ID: Judy Wood, female   DOB: 02/22/1996, 19 y.o.   MRN: [id number] FACULTY PRACTICE ANTEPARTUM(COMPREHENSIVE) NOTE  Judy Wood is a 19 y.o. G2P1001 at 2235w2d by best clinical estimate who is admitted for PROM.   Fetal presentation is cephalic. Length of Stay:  7  Days  Subjective: States I do not feel well. Reports abdominal pain and return of bleeding. Also having low back pain. Patient reports the fetal movement as active. Patient reports uterine contraction  activity as none. Patient reports  vaginal bleeding as scant staining. Patient describes fluid per vagina as Clear.  Vitals:  Blood pressure 122/53, pulse 98, temperature 98.3 F (36.8 C), temperature source Oral, resp. rate 20, height 5\' 4"  (1.626 m), weight 251 lb (113.853 kg), last menstrual period 01/04/2014, SpO2 95 %. Physical Examination:  General appearance - alert, well appearing, and in no distress Abdomen - gravid, NT Fundal Height:  size equals dates Extremities: extremities normal, atraumatic, no cyanosis or edema and Homans sign is negative, no sign of DVT  Membranes:ruptured, clear fluid  Fetal Monitoring:  Baseline: 155 bpm, Variability: Good {> 6 bpm), Accelerations: Reactive and Decelerations: Absent  Labs:  Results for orders placed or performed during the hospital encounter of 07/29/14 (from the past 24 hour(s))  Type and screen   Collection Time: 08/04/14  4:40 PM  Result Value Ref Range   ABO/RH(D) A POS    Antibody Screen NEG    Sample Expiration 08/07/2014      Medications:  Scheduled . amoxicillin  500 mg Oral 3 times per day  . aspirin EC  81 mg Oral Daily  . docusate sodium  100 mg Oral Daily  . labetalol  50 mg Oral BID  . prenatal multivitamin  1 tablet Oral Q1200  . sodium chloride  3 mL Intravenous Q12H   I have reviewed the patient's current medications.  ASSESSMENT: Patient Active Problem List   Diagnosis Date Noted  . IUGR (intrauterine growth restriction)   .  Vaginal bleeding during pregnancy, antepartum   . Vaginal bleeding in pregnancy 07/29/2014  . Oligohydramnios in third trimester, antepartum 07/29/2014  . IUGR (intrauterine growth restriction) affecting care of mother 07/29/2014  . Breech presentation 07/29/2014  . Obesity affecting pregnancy   . Prior poor obstetrical history in second trimester, antepartum   . Chronic hypertension complicating or reason for care during pregnancy 04/22/2014  . Previous cesarean delivery, antepartum 05/23/2012  . Hypertension 10/17/2011    PLAN: Continue to watch closely.   Delivery with s/sx's intrauterine inlammation Check CBC Put on monitor now.  Reva BoresPRATT,TANYA S, MD 08/05/2014,7:21 AM

## 2014-08-05 NOTE — Progress Notes (Signed)
Called to room to evaluate back pain and cramping.  Has had some increased pink discharge today, some menstrual like cramping and back pain.  Unsure if she is having contractions, none noted on toco.  BP 129/63 mmHg  Pulse 101  Temp(Src) 98.2 F (36.8 C) (Oral)  Resp 18  Ht 5\' 4"  (1.626 m)  Wt 251 lb (113.853 kg)  BMI 43.06 kg/m2  SpO2 95%  LMP 01/04/2014 Gen: very comfortable SCE: deferred  Patient is 19 y.o. G2P1001 4462w2d with PPROM - flexeril and percocets prn - po hydration encouraged - may be early latent labor, exam deferred as pt is very comfortable at this time.  Perry MountACOSTA,Calise Dunckel ROCIO, MD 6:38 PM

## 2014-08-06 DIAGNOSIS — O42919 Preterm premature rupture of membranes, unspecified as to length of time between rupture and onset of labor, unspecified trimester: Secondary | ICD-10-CM

## 2014-08-06 LAB — GLUCOSE TOLERANCE, 1 HOUR: GLUCOSE 1 HOUR GTT: 108 mg/dL (ref 70–140)

## 2014-08-06 MED ORDER — SALINE SPRAY 0.65 % NA SOLN
1.0000 | NASAL | Status: DC | PRN
  Filled 2014-08-06: qty 44

## 2014-08-06 NOTE — Progress Notes (Addendum)
Patient ID: Judy Wood, female   DOB: 06/10/1995, 19 y.o.   MRN: [id number] FACULTY PRACTICE ANTEPARTUM(COMPREHENSIVE) NOTE  Judy Wood is a 19 y.o. G2P1001 at 7467w3d  who is admitted for PPROM.   Length of Stay:  8  Days  Subjective: Patient reports the fetal movement as active. Patient reports uterine contraction  activity as none. Patient reports  vaginal bleeding as scant staining. Patient describes fluid per vagina as Clear.  Vitals:  Blood pressure 109/45, pulse 93, temperature 98.3 F (36.8 C), temperature source Oral, resp. rate 20, height 5\' 4"  (1.626 m), weight 251 lb (113.853 kg), last menstrual period 01/04/2014, SpO2 95 %. Physical Examination:  General appearance - alert, well appearing, and in no distress Abdomen - soft, nontender, nondistended, no masses or organomegaly Extremities - Homan's sign negative bilaterally  Fetal Monitoring:  Baseline: 150 bpm, Variability: Good {> 6 bpm), Accelerations: Reactive and Decelerations: Absent No contractions  Labs:  Results for orders placed or performed during the hospital encounter of 07/29/14 (from the past 24 hour(s))  CBC   Collection Time: 08/05/14  8:02 AM  Result Value Ref Range   WBC 14.5 (H) 4.0 - 10.5 K/uL   RBC 3.20 (L) 3.87 - 5.11 MIL/uL   Hemoglobin 9.4 (L) 12.0 - 15.0 g/dL   HCT 78.228.6 (L) 95.636.0 - 21.346.0 %   MCV 89.4 78.0 - 100.0 fL   MCH 29.4 26.0 - 34.0 pg   MCHC 32.9 30.0 - 36.0 g/dL   RDW 08.613.8 57.811.5 - 46.915.5 %   Platelets 309 150 - 400 K/uL   Medications:  Scheduled . amoxicillin  500 mg Oral 3 times per day  . aspirin EC  81 mg Oral Daily  . docusate sodium  100 mg Oral Daily  . labetalol  50 mg Oral BID  . prenatal multivitamin  1 tablet Oral Q1200  . sodium chloride  3 mL Intravenous Q12H   I have reviewed the patient's current medications.  ASSESSMENT: Patient Active Problem List   Diagnosis Date Noted  . IUGR (intrauterine growth restriction)   . Vaginal bleeding during pregnancy, antepartum    . Vaginal bleeding in pregnancy 07/29/2014  . Oligohydramnios in third trimester, antepartum 07/29/2014  . IUGR (intrauterine growth restriction) affecting care of mother 07/29/2014  . Breech presentation 07/29/2014  . Obesity affecting pregnancy   . Prior poor obstetrical history in second trimester, antepartum   . Chronic hypertension complicating or reason for care during pregnancy 04/22/2014  . Previous cesarean delivery, antepartum 05/23/2012  . Hypertension 10/17/2011    PLAN: Continue antibiotics for PPROM.  Pt on oral mecds.  Total course 7 days Patient to save all pads / tissue so we can see staining. No tocolysis Cephalic--can labor Watch BP--d/c labetalol if BP remains low. Glucola today   Judy Wood H. 08/06/2014,7:42 AM

## 2014-08-06 NOTE — Progress Notes (Signed)
Initial Nutrition Assessment  DOCUMENTATION CODES:  Morbid obesity  INTERVENTION:  Snacks/ Regular diet Monitor results of GTT  NUTRITION DIAGNOSIS:  Increased nutrient needs related to  (pregnancy and fetal growth requirements) as evidenced by  ([redacted] weeks gestation).  GOAL:  Patient will meet greater than or equal to 90% of their needs  MONITOR:  Weight trends  REASON FOR ASSESSMENT:   (Antenatal  admission)   ASSESSMENT:  27 3/7 weeks with PROM. Pre-pregnancy weight 256 Lbs, no weight gain, Prepregnancy BMI 44, morbid obesity Pt may order snacks between meals/ double protein portions to meet est needs of pregnancy Will monitor GTT results and any diet order changes  Height:  Ht Readings from Last 1 Encounters:  08/02/14 5\' 4"  (1.626 m)    Weight:  Wt Readings from Last 1 Encounters:  08/02/14 251 lb (113.853 kg)    Ideal Body Weight:     Wt Readings from Last 10 Encounters:  08/02/14 251 lb (113.853 kg)  07/21/14 250 lb (113.399 kg)  06/19/14 251 lb 14.4 oz (114.261 kg)  05/22/14 256 lb 1.6 oz (116.166 kg)  04/23/14 259 lb 12 oz (117.822 kg)  04/22/14 256 lb (116.121 kg)  03/08/14 260 lb 6.4 oz (118.117 kg)  11/01/12 241 lb 6.4 oz (109.498 kg) (99 %*, Z = 2.40)  05/17/12 271 lb (122.925 kg) (100 %*, Z = 2.59)  05/14/12 271 lb (122.925 kg) (100 %*, Z = 2.59)   * Growth percentiles are based on CDC 2-20 Years data.    BMI:  Body mass index is 43.06 kg/(m^2).  Estimated Nutritional Needs:  Kcal:  2400-2500  Protein:  100-110 g  Fluid:  2.6 L  Diet Order:  Diet regular Room service appropriate?: Yes; Fluid consistency:: Thin  EDUCATION NEEDS:  No education needs identified at this time  No intake or output data in the 24 hours ending 08/06/14 0913   [facility] Lima CampusKatherine Yuliya Nova M.Odis LusterEd. R.D. LDN Neonatal Nutrition Support Specialist/RD III Pager [phone]      Phone [phone]

## 2014-08-07 LAB — TYPE AND SCREEN
ABO/RH(D): A POS
ANTIBODY SCREEN: NEGATIVE

## 2014-08-07 NOTE — Plan of Care (Signed)
Problem: Phase I Progression Outcomes Goal: LOS < 4 days Outcome: Not Met (add Reason) Patient admitted to antenatal for prolonged hospitalization due to PROM

## 2014-08-07 NOTE — Progress Notes (Signed)
Follow up chaplain visit. Family also in room with patient. Brought patient coloring sheets. She has her own crayons. If she'd like more sheets, call the chaplains.  Donnelly Stager, [facility], PhD Chaplain

## 2014-08-07 NOTE — Progress Notes (Signed)
Patient ID: Judy Wood, female   DOB: 09/12/1995, 19 y.o.   MRN: [id number] Patient ID: Judy Wood, female   DOB: November 27, 1995, 19 y.o.   MRN: [id number] FACULTY PRACTICE ANTEPARTUM(COMPREHENSIVE) NOTE  Judy Wood is a 19 y.o. G2P1001 at [redacted]w[redacted]d   who is admitted for PPROM.   Length of Stay:  9  Days  Subjective: Patient reports the fetal movement as active. Patient reports uterine contraction  activity as none. Patient reports  vaginal bleeding as scant staining. Patient describes fluid per vagina as Clear.  Vitals:  Blood pressure 128/59, pulse 113, temperature 98.1 F (36.7 C), temperature source Oral, resp. rate 18, height 5\' 4"  (1.626 m), weight 253 lb (114.76 kg), last menstrual period 01/04/2014, SpO2 95 %.   Filed Vitals:   08/06/14 1015 08/06/14 1136 08/06/14 1608 08/06/14 2059  BP:  98/70 118/59 128/59  Pulse:  103 97 113  Temp:  98.4 F (36.9 C) 98.4 F (36.9 C) 98.1 F (36.7 C)  TempSrc:  Oral Oral Oral  Resp:  18 18   Height:      Weight: 253 lb (114.76 kg)     SpO2:        Physical Examination:  General appearance - alert, well appearing, and in no distress Abdomen - soft, nontender, nondistended, no masses or organomegaly Extremities - Homan's sign negative bilaterally  Fetal Monitoring:  Baseline: 150 bpm, Variability: Good {> 6 bpm), Accelerations: Reactive and Decelerations: Absent No contractions  Labs:  Results for orders placed or performed during the hospital encounter of 07/29/14 (from the past 24 hour(s))  Glucose tolerance, 1 hour   Collection Time: 08/06/14 10:33 AM  Result Value Ref Range   Glucose, 1 Hour GTT 108 70 - 140 mg/dL   Medications:  Scheduled . aspirin EC  81 mg Oral Daily  . docusate sodium  100 mg Oral Daily  . labetalol  50 mg Oral BID  . prenatal multivitamin  1 tablet Oral Q1200   I have reviewed the patient's current medications.  ASSESSMENT: Patient Active Problem List   Diagnosis Date Noted  . Preterm premature  rupture of membranes 08/06/2014  . IUGR (intrauterine growth restriction)   . Vaginal bleeding during pregnancy, antepartum   . Oligohydramnios in third trimester, antepartum 07/29/2014  . IUGR (intrauterine growth restriction) affecting care of mother 07/29/2014  . Obesity affecting pregnancy   . Prior poor obstetrical history in second trimester, antepartum   . Chronic hypertension complicating or reason for care during pregnancy 04/22/2014  . Previous cesarean delivery, antepartum 05/23/2012  . Hypertension 10/17/2011    PLAN: Completed antibiotics for PPROM. Patient to save all pads / tissue so we can see staining. No tocolysis Cephalic--can labor Watch BP--d/c labetalol if BP remains low. Glucola today   Malka Bocek H 08/07/2014,7:21 AM

## 2014-08-08 LAB — CBC
HEMATOCRIT: 29.5 % — AB (ref 36.0–46.0)
Hemoglobin: 9.8 g/dL — ABNORMAL LOW (ref 12.0–15.0)
MCH: 29.6 pg (ref 26.0–34.0)
MCHC: 33.2 g/dL (ref 30.0–36.0)
MCV: 89.1 fL (ref 78.0–100.0)
PLATELETS: 299 10*3/uL (ref 150–400)
RBC: 3.31 MIL/uL — ABNORMAL LOW (ref 3.87–5.11)
RDW: 13.7 % (ref 11.5–15.5)
WBC: 14.1 10*3/uL — AB (ref 4.0–10.5)

## 2014-08-08 NOTE — Progress Notes (Signed)
Patient ID: Judy Wood, female   DOB: [birth year], 19 y.o.   MRN: [id number] FACULTY PRACTICE ANTEPARTUM NOTE  Judy Wood is a 19 y.o. G2P1001 at [redacted]w[redacted]d  who is admitted for rupture of membranes, vaginal bleeding.   Fetal presentation is cephalic. Length of Stay:  10  Days  Subjective: Reports occasional contractions.  When she has some contractions, she has some mucusy-bloody discharge.  None now.  Patient reports good fetal movement.  She reports no active bleeding and no loss of fluid per vagina.  No headaches, blurred vision, chest pain, palpitations.  Denies abdominal tenderness, tachycardia, fever, fetal tachycardia.   Vitals:  Blood pressure 117/52, pulse 88, temperature 97.4 F (36.3 C), temperature source Oral, resp. rate 20, height 5\' 4"  (1.626 m), weight 253 lb (114.76 kg), last menstrual period 01/04/2014, SpO2 95 %. Physical Examination:  General appearance - alert, well appearing, and in no distress Chest - clear to auscultation, no wheezes, rales or rhonchi, symmetric air entry Heart - normal rate, regular rhythm, normal S1, S2, no murmurs, rubs, clicks or gallops Abdomen - soft, nontender, nondistended, no masses or organomegaly Fundal Height:  size less than dates Extremities: extremities normal, atraumatic, no cyanosis or edema  Membranes:ruptured  Fetal Monitoring:  Baseline: 140 bpm, Variability: Good {> 6 bpm), Accelerations: Reactive and Decelerations: Absent  Labs:  Results for orders placed or performed during the hospital encounter of 07/29/14 (from the past 24 hour(s))  Type and screen   Collection Time: 08/07/14  5:09 PM  Result Value Ref Range   ABO/RH(D) A POS    Antibody Screen NEG    Sample Expiration 08/10/2014     Imaging Studies:       Medications:  Scheduled . aspirin EC  81 mg Oral Daily  . docusate sodium  100 mg Oral Daily  . labetalol  50 mg Oral BID  . prenatal multivitamin  1 tablet Oral Q1200   I have reviewed the patient's  current medications.  ASSESSMENT: Patient Active Problem List   Diagnosis Date Noted  . Chronic hypertension complicating or reason for care during pregnancy 04/22/2014    Priority: High  . Preterm premature rupture of membranes 08/06/2014  . IUGR (intrauterine growth restriction)   . Vaginal bleeding during pregnancy, antepartum   . Oligohydramnios in third trimester, antepartum 07/29/2014  . IUGR (intrauterine growth restriction) affecting care of mother 07/29/2014  . Obesity affecting pregnancy   . Prior poor obstetrical history in second trimester, antepartum   . Previous cesarean delivery, antepartum 05/23/2012  . Hypertension 10/17/2011    PLAN: 1.  CHTN 2.  Vaginal Bleeding 3.  Oligohydramnios 4.  IUP at [redacted]w[redacted]d  Continue labetalol - no symptoms of preeclampsia Not leaking fluid NST reactive No active bleeding - ? Bloody show.  Pt to show nurses after next bleed Continue NST BID-TID Delivery for maternal or fetal concerns.  No evidence of intrauterine infection Continue routine antenatal care.   Rhona Raider Elizet Kaplan, DO 08/08/2014,7:04 AM

## 2014-08-08 NOTE — Progress Notes (Signed)
YULIANA PENDERGAST is a 19 y.o. G2P1001 at [redacted]w[redacted]d by ultrasound admitted for [facility]  Subjective:irregular contractions and spotting   Objective: BP 128/64 mmHg  Pulse 103  Temp(Src) 97.7 F (36.5 C) (Oral)  Resp 18  Ht 5\' 4"  (1.626 m)  Wt 114.76 kg (253 lb)  BMI 43.41 kg/m2  SpO2 95%  LMP 01/04/2014    uterus soft not tender  FHT:  Fetal Heart Rate A      Mode  External [Applied] filed at 08/08/2014 1528    Baseline Rate (A)  145 bpm filed at 08/08/2014 1528    Variability  <5 BPM, 6-25 BPM filed at 08/08/2014 1131    Accelerations  10 x 10 filed at 08/08/2014 1131    Decelerations  Variable filed at 08/08/2014 1131       UC:   None palpable SVE:    spotting noted cx not checked  Labs: Lab Results  Component Value Date   WBC 14.5* 08/05/2014   HGB 9.4* 08/05/2014   HCT 28.6* 08/05/2014   MCV 89.4 08/05/2014   PLT 309 08/05/2014    Assessment / Plan:PPROM, spotting, fetal tracing appropriate for GA   Watch for s/sx PTL, increased VB, infection. CBC to repeat  Marquavious Nazar 08/08/2014, 4:05 PM

## 2014-08-08 NOTE — Progress Notes (Signed)
This was a follow up visit with Judy Wood.  She used the visit well to process some of her experiences caring for her mother who has a serious mental illness.  She described the impact of this on her teenage years and how it affects her now as she is caring for her daughter and preparing to care for a second child.  She has other psychosocial concerns including the fact that FOB is incarcerated at this time.    She is coping with strong emotions from dealing with these stressors and others.  She is interested in speaking with a Child psychotherapist and I spoke with Lulu Riding, LCSW about seeing her.  Spiritual Care will also continue to follow up for support, but please also page as needs arise.  [address] Tomasita Morrow, bcc Pager, [phone] 5:38 PM    08/08/14 1700  Clinical Encounter Type  Visited With Patient  Visit Type Spiritual support;Follow-up  Stress Factors  Patient Stress Factors Loss of control;Major life changes;Family relationships

## 2014-08-09 MED ORDER — DOCUSATE SODIUM 100 MG PO CAPS
100.0000 mg | ORAL_CAPSULE | Freq: Two times a day (BID) | ORAL | Status: DC
  Administered 2014-08-09 – 2014-08-15 (×12): 100 mg via ORAL
  Filled 2014-08-09 (×12): qty 1

## 2014-08-09 NOTE — Progress Notes (Signed)
Patient ID: Judy Wood, female   DOB: [birth year], 19 y.o.   MRN: [id number] FACULTY PRACTICE ANTEPARTUM(COMPREHENSIVE) NOTE  Judy Wood is a 19 y.o. G2P1001 at [redacted]w[redacted]d by LMP, date of conception who is admitted for rupture of membranes, SGA infant 15%ile with normal dopplers on 08/04/14.   Fetal presentation is cephalic. Length of Stay:  11  Days  Subjective: Pt had lite d/c yesterday, was on continuous monitor x 24 hr with only one contraction/hour Patient reports the fetal movement as active. Patient reports uterine contraction  activity as irregular, every 60 minutes. Patient reports  vaginal bleeding as none. Patient describes fluid per vagina as Other lite mucus.  Vitals:  Blood pressure 123/62, pulse 100, temperature 98.1 F (36.7 C), temperature source Oral, resp. rate 20, height 5\' 4"  (1.626 m), weight 114.76 kg (253 lb), last menstrual period 01/04/2014, SpO2 95 %. Physical Examination:  General appearance - alert, well appearing, and in no distress, oriented to person, place, and time and overweight Heart - normal rate and regular rhythm Abdomen - soft, nontender, nondistended Fundal Height:  size equals dates Cervical Exam: Not evaluated. and f and fetal presentation is cephalic. Extremities: extremities normal, atraumatic, no cyanosis or edema and Homans sign is negative, no sign of DVT with DTRs 2+ bilaterally Membranes:ruptured  Fetal Monitoring:  Baseline: 145 bpm, Variability: Fair (1-6 bpm), Accelerations: Non-reactive but appropriate for gestational age and Decelerations: Absent  Labs:  Results for orders placed or performed during the hospital encounter of 07/29/14 (from the past 24 hour(s))  CBC   Collection Time: 08/08/14  5:00 PM  Result Value Ref Range   WBC 14.1 (H) 4.0 - 10.5 K/uL   RBC 3.31 (L) 3.87 - 5.11 MIL/uL   Hemoglobin 9.8 (L) 12.0 - 15.0 g/dL   HCT 10.1 (L) 75.1 - 02.5 %   MCV 89.1 78.0 - 100.0 fL   MCH 29.6 26.0 - 34.0 pg   MCHC 33.2 30.0 -  36.0 g/dL   RDW 85.2 77.8 - 24.2 %   Platelets 299 150 - 400 K/uL    Imaging Studies:     Currently EPIC will not allow sonographic studies to automatically populate into notes.  In the meantime, copy and paste results into note or free text.  Medications:  Scheduled . aspirin EC  81 mg Oral Daily  . docusate sodium  100 mg Oral Daily  . labetalol  50 mg Oral BID  . prenatal multivitamin  1 tablet Oral Q1200   I have reviewed the patient's current medications.  ASSESSMENT: Patient Active Problem List   Diagnosis Date Noted  . Preterm premature rupture of membranes 08/06/2014  . IUGR (intrauterine growth restriction)   . Vaginal bleeding during pregnancy, antepartum   . Oligohydramnios in third trimester, antepartum 07/29/2014  . IUGR (intrauterine growth restriction) affecting care of mother 07/29/2014  . Obesity affecting pregnancy   . Prior poor obstetrical history in second trimester, antepartum   . Chronic hypertension complicating or reason for care during pregnancy 04/22/2014  . Previous cesarean delivery, antepartum 05/23/2012  . Hypertension 10/17/2011    PLAN: Inpatient care til delivery Return to q shift monitoring  Gisella Alwine V 08/09/2014,7:56 AM

## 2014-08-10 LAB — TYPE AND SCREEN
ABO/RH(D): A POS
Antibody Screen: NEGATIVE

## 2014-08-10 MED ORDER — FAMOTIDINE 20 MG PO TABS
20.0000 mg | ORAL_TABLET | Freq: Two times a day (BID) | ORAL | Status: DC
  Administered 2014-08-10 – 2014-08-15 (×11): 20 mg via ORAL
  Filled 2014-08-10 (×11): qty 1

## 2014-08-10 NOTE — Progress Notes (Signed)
Patient ID: Judy Wood, female   DOB: 07/19/1995, 19 y.o.   MRN: [id number] FACULTY PRACTICE ANTEPARTUM(COMPREHENSIVE) NOTE  Judy Wood is a 19 y.o. G2P1001 at [redacted]w[redacted]d who is admitted for rupture of membranes, SGA infant 15%ile with normal dopplers on 08/04/14.   Fetal presentation is cephalic. Length of Stay:  12  Days  Subjective: Patient reports light discharge Patient reports the fetal movement as active. Patient reports uterine contraction  activity as irregular, every 60 minutes. Patient reports  vaginal bleeding as none. Patient describes fluid per vagina as Other lite mucus.  Vitals:  Blood pressure 126/67, pulse 102, temperature 98.1 F (36.7 C), temperature source Oral, resp. rate 20, height 5\' 4"  (1.626 m), weight 253 lb (114.76 kg), last menstrual period 01/04/2014, SpO2 95 %. Physical Examination:  General appearance - alert, well appearing, and in no distress, oriented to person, place, and time and overweight Heart - normal rate and regular rhythm Abdomen - soft, nontender, nondistended Fundal Height:  size equals dates Cervical Exam: Not evaluated. and f and fetal presentation is cephalic. Extremities: extremities normal, atraumatic, no cyanosis or edema and Homans sign is negative, no sign of DVT with DTRs 2+ bilaterally Membranes:ruptured  Fetal Monitoring:  Baseline: 145 bpm, Variability: Good {> 6 bpm), Accelerations: Reactive, Decelerations: Absent and Toco: no contractions  Labs:  No results found for this or any previous visit (from the past 24 hour(s)).  Imaging Studies:      Medications:  Scheduled . aspirin EC  81 mg Oral Daily  . docusate sodium  100 mg Oral BID  . labetalol  50 mg Oral BID  . prenatal multivitamin  1 tablet Oral Q1200   I have reviewed the patient's current medications.  ASSESSMENT: Patient Active Problem List   Diagnosis Date Noted  . Preterm premature rupture of membranes 08/06/2014  . IUGR (intrauterine growth restriction)    . Vaginal bleeding during pregnancy, antepartum   . Oligohydramnios in third trimester, antepartum 07/29/2014  . IUGR (intrauterine growth restriction) affecting care of mother 07/29/2014  . Obesity affecting pregnancy   . Prior poor obstetrical history in second trimester, antepartum   . Chronic hypertension complicating or reason for care during pregnancy 04/22/2014  . Previous cesarean delivery, antepartum 05/23/2012  . Hypertension 10/17/2011    PLAN: Continue monitoring for si/sx of chorio Continue current antepartum care   Judy Wood 08/10/2014,7:25 AM

## 2014-08-11 ENCOUNTER — Encounter: Payer: Medicaid Other | Admitting: Family Medicine

## 2014-08-11 MED ORDER — DIPHENHYDRAMINE HCL 25 MG PO CAPS
25.0000 mg | ORAL_CAPSULE | Freq: Every evening | ORAL | Status: DC | PRN
  Administered 2014-08-13: 25 mg via ORAL
  Filled 2014-08-11: qty 1

## 2014-08-11 NOTE — Progress Notes (Signed)
Patient ID: Judy Wood, female   DOB: [birth year], 19 y.o.   MRN: [id number] FACULTY PRACTICE ANTEPARTUM(COMPREHENSIVE) NOTE  Judy Wood is a 19 y.o. G2P1001 at [redacted]w[redacted]d by best clinical estimate who is admitted for PROM.   Fetal presentation is cephalic. Length of Stay:  13  Days  Subjective: Still leaking a small amount of fluid, which is mucousy Patient reports the fetal movement as active. Patient reports uterine contraction  activity as none. Patient reports  vaginal bleeding as none. Patient describes fluid per vagina as None.  Vitals:  Blood pressure 117/56, pulse 90, temperature 98.1 F (36.7 C), temperature source Oral, resp. rate 18, height 5\' 4"  (1.626 m), weight 253 lb (114.76 kg), last menstrual period 01/04/2014, SpO2 95 %. Physical Examination:  General appearance - alert, well appearing, and in no distress Abdomen - gravid, NT Fundal Height:  size equals dates Extremities: extremities normal, atraumatic, no cyanosis or edema  Membranes:intact  Fetal Monitoring:  Baseline: 145 bpm, Variability: Good {> 6 bpm), Accelerations: Reactive and Decelerations: Absent  Labs:  Results for orders placed or performed during the hospital encounter of 07/29/14 (from the past 24 hour(s))  Type and screen   Collection Time: 08/10/14  7:51 PM  Result Value Ref Range   ABO/RH(D) A POS    Antibody Screen NEG    Sample Expiration 08/13/2014     Medications:  Scheduled . aspirin EC  81 mg Oral Daily  . docusate sodium  100 mg Oral BID  . famotidine  20 mg Oral BID  . labetalol  50 mg Oral BID  . prenatal multivitamin  1 tablet Oral Q1200   I have reviewed the patient's current medications.  ASSESSMENT: Patient Active Problem List   Diagnosis Date Noted  . Preterm premature rupture of membranes 08/06/2014  . IUGR (intrauterine growth restriction)   . Vaginal bleeding during pregnancy, antepartum   . Oligohydramnios in third trimester, antepartum 07/29/2014  . IUGR  (intrauterine growth restriction) affecting care of mother 07/29/2014  . Obesity affecting pregnancy   . Prior poor obstetrical history in second trimester, antepartum   . Chronic hypertension complicating or reason for care during pregnancy 04/22/2014  . Previous cesarean delivery, antepartum 05/23/2012  . Hypertension 10/17/2011  Insomnia  PLAN: Will check labs to ensure she is truly ruptured delivery with s/sx's of chorio Benadryl for sleep  Reva Bores, MD 08/11/2014,8:08 AM

## 2014-08-12 ENCOUNTER — Inpatient Hospital Stay (HOSPITAL_COMMUNITY): Payer: Medicaid Other

## 2014-08-12 LAB — AMNISURE RUPTURE OF MEMBRANE (ROM) NOT AT ARMC: Amnisure ROM: NEGATIVE

## 2014-08-12 NOTE — Plan of Care (Signed)
Problem: Consults Goal: Birthing Suites Patient Information Press F2 to bring up selections list  Outcome: Not Applicable Date Met:  09/32/35 Patient admitted to antenatal for prolonged hospitalization.

## 2014-08-12 NOTE — Progress Notes (Signed)
Patient ID: Judy Wood, female   DOB: 1995-03-17, 19 y.o.   MRN: [id number] FACULTY PRACTICE ANTEPARTUM(COMPREHENSIVE) NOTE  Judy Wood is a 20 y.o. G2P1001 at [redacted]w[redacted]d by best clinical estimate who is admitted for PROM.   Fetal presentation is cephalic. Length of Stay:  14  Days  Subjective: Patient reports leaking of fluid yesterday, more than previously. She states it was pink tinged Patient reports the fetal movement as active. Patient reports uterine contraction  activity as none. Patient reports  vaginal bleeding as none. Patient describes fluid per vagina as None.  Vitals:  Blood pressure 120/62, pulse 94, temperature 98.1 F (36.7 C), temperature source Oral, resp. rate 20, height 5\' 4"  (1.626 m), weight 253 lb (114.76 kg), last menstrual period 01/04/2014, SpO2 95 %. Physical Examination:  General appearance - alert, well appearing, and in no distress Abdomen - gravid, NT Fundal Height:  size equals dates Extremities: extremities normal, atraumatic, no cyanosis or edema  Membranes:intact  Fetal Monitoring:  Baseline: 145 bpm, Variability: Good {> 6 bpm), Accelerations: Reactive and Decelerations: Absent  Labs:  No results found for this or any previous visit (from the past 24 hour(s)).  Medications:  Scheduled . aspirin EC  81 mg Oral Daily  . docusate sodium  100 mg Oral BID  . famotidine  20 mg Oral BID  . labetalol  50 mg Oral BID  . prenatal multivitamin  1 tablet Oral Q1200   I have reviewed the patient's current medications.  ASSESSMENT: Patient Active Problem List   Diagnosis Date Noted  . Preterm premature rupture of membranes 08/06/2014  . IUGR (intrauterine growth restriction)   . Vaginal bleeding during pregnancy, antepartum   . Oligohydramnios in third trimester, antepartum 07/29/2014  . IUGR (intrauterine growth restriction) affecting care of mother 07/29/2014  . Obesity affecting pregnancy   . Prior poor obstetrical history in second trimester,  antepartum   . Chronic hypertension complicating or reason for care during pregnancy 04/22/2014  . Previous cesarean delivery, antepartum 05/23/2012  . Hypertension 10/17/2011  Insomnia  PLAN: Continue monitoring for si/sx of chorioamnionitis Continue current antepartum care Amniosure today  Briawna Carver, MD 08/12/2014,7:15 AM

## 2014-08-13 LAB — TYPE AND SCREEN
ABO/RH(D): A POS
Antibody Screen: NEGATIVE

## 2014-08-13 NOTE — Progress Notes (Signed)
Patient ID: Judy Wood, female   DOB: [birth year], 19 y.o.   MRN: [id number] FACULTY PRACTICE ANTEPARTUM NOTE  Judy Wood is a 19 y.o. G2P1001 at [redacted]w[redacted]d  who is admitted for Vaginal bleeding and oligohydramnios - suspected PPROM.   Fetal presentation is cephalic. Length of Stay:  15  Days  Subjective:  Patient reports no headaches, abdominal pain, nausea.  Continues to have spotting and leaking of fluid occasionally, particularly if sits for long periods or walks for long periods.  Patient reports good fetal movement.  She reports no uterine contractions, no bleeding and no loss of fluid per vagina.  Vitals:  Blood pressure 126/60, pulse 100, temperature 97.2 F (36.2 C), temperature source Oral, resp. rate 20, height 5\' 4"  (1.626 m), weight 253 lb (114.76 kg), last menstrual period 01/04/2014, SpO2 95 %. Physical Examination:  General appearance - alert, well appearing, and in no distress Chest - clear to auscultation, no wheezes, rales or rhonchi, symmetric air entry Heart - normal rate, regular rhythm, normal S1, S2, no murmurs, rubs, clicks or gallops Abdomen - soft, nontender, nondistended, no masses or organomegaly Fundal Height:  size equals dates Extremities: extremities normal, atraumatic, no cyanosis or edema   Fetal Monitoring:  Baseline: 130s bpm, Variability: Good {> 6 bpm), Accelerations: Reactive and Decelerations: Absent  Labs:  Results for orders placed or performed during the hospital encounter of 07/29/14 (from the past 24 hour(s))  Amnisure rupture of membrane (rom)not at [facility]   Collection Time: 08/12/14  7:50 AM  Result Value Ref Range   Amnisure ROM NEGATIVE     Imaging Studies:    Normal dopplers BPP 6/8, -2 for breathing, which is normal for gestational age. AFI 13. Biometry: 1088g, 35%tile    Medications:  Scheduled . aspirin EC  81 mg Oral Daily  . docusate sodium  100 mg Oral BID  . famotidine  20 mg Oral BID  . labetalol  50 mg Oral BID  .  prenatal multivitamin  1 tablet Oral Q1200   I have reviewed the patient's current medications.  ASSESSMENT: Patient Active Problem List   Diagnosis Date Noted  . Chronic hypertension complicating or reason for care during pregnancy 04/22/2014    Priority: High  . Preterm premature rupture of membranes 08/06/2014  . IUGR (intrauterine growth restriction)   . Vaginal bleeding during pregnancy, antepartum   . Oligohydramnios in third trimester, antepartum 07/29/2014  . IUGR (intrauterine growth restriction) affecting care of mother 07/29/2014  . Obesity affecting pregnancy   . Prior poor obstetrical history in second trimester, antepartum   . Previous cesarean delivery, antepartum 05/23/2012  . Hypertension 10/17/2011    PLAN: 1.  CHTN - BP controlled.  Continue NST twice daily.  Recheck growth in 3 weeks.  2.  PPROM - s/p latency antibiotics, NST twice daily.  No evidence of intrauterine infection.  Delivery for maternal or fetal concerns.  NST reactive last night.  AFI of 13 suggests reseal of membranes.  Recommendation from MFM is to continue PPROM management.  3.  IUP - normal growth.   4.  Vaginal Bleeding - Continues to have occasional spotting, but no major bleeding episodes.  Continue routine antenatal care.   Rhona Raider Stinson, DO 08/13/2014,6:55 AM

## 2014-08-13 NOTE — Progress Notes (Signed)
Visitor visibly pregnant, seen vomiting bright red blood, visitor instructed to be seen secondary vomiting blood is abnormal.  Visitor acknowledged nurse's suggestion, but RN not sure if she will be seen or not.

## 2014-08-14 LAB — AMNISURE RUPTURE OF MEMBRANE (ROM) NOT AT ARMC: Amnisure ROM: NEGATIVE

## 2014-08-14 MED ORDER — ACETAMINOPHEN 500 MG PO TABS
1000.0000 mg | ORAL_TABLET | Freq: Four times a day (QID) | ORAL | Status: DC | PRN
  Administered 2014-08-14 – 2014-08-15 (×2): 1000 mg via ORAL
  Filled 2014-08-14 (×2): qty 2

## 2014-08-14 MED ORDER — OXYCODONE-ACETAMINOPHEN 5-325 MG PO TABS
1.0000 | ORAL_TABLET | Freq: Four times a day (QID) | ORAL | Status: DC | PRN
  Administered 2014-08-14: 1 via ORAL
  Filled 2014-08-14: qty 1

## 2014-08-14 MED ORDER — CYCLOBENZAPRINE HCL 10 MG PO TABS
10.0000 mg | ORAL_TABLET | Freq: Three times a day (TID) | ORAL | Status: DC | PRN
  Administered 2014-08-14: 10 mg via ORAL
  Filled 2014-08-14: qty 1

## 2014-08-14 NOTE — Progress Notes (Signed)
Patient ID: Judy Wood, female   DOB: [birth year], 19 y.o.   MRN: [id number] FACULTY PRACTICE ANTEPARTUM NOTE  Judy Wood is a 19 y.o. G2P1001 at [redacted]w[redacted]d  who is admitted for Vaginal bleeding and oligohydramnios - suspected PPROM.    Fetal presentation is cephalic.  Length of Stay:  16  Days  Subjective: Patient reports continued leaking of fluid occasionally, particularly if sits for long periods or walks for long periods.  Also reports low back pain and body pain.  Patient reports good fetal movement.  She reports no uterine contractions, no bleeding and no loss of fluid per vagina.  Vitals:  Blood pressure 113/56, pulse 101, temperature 98.5 F (36.9 C), temperature source Oral, resp. rate 20, height 5\' 4"  (1.626 m), weight 259 lb (117.482 kg), last menstrual period 01/04/2014, SpO2 95 %. Physical Examination: General appearance - alert, well appearing, and in no distress Chest - clear to auscultation, no wheezes, rales or rhonchi, symmetric air entry Heart - normal rate, regular rhythm, normal S1, S2, no murmurs, rubs, clicks or gallops Abdomen - soft, nontender, nondistended, no masses or organomegaly Fundal Height:  size equals dates Extremities: extremities normal, atraumatic, no cyanosis or edema   Fetal Monitoring:  Baseline: 130s bpm, Variability: Good {> 6 bpm), Accelerations: Reactive and Decelerations: Absent  Labs:  Results for orders placed or performed during the hospital encounter of 07/29/14 (from the past 24 hour(s))  Type and screen   Collection Time: 08/13/14  3:51 PM  Result Value Ref Range   ABO/RH(D) A POS    Antibody Screen NEG    Sample Expiration 08/16/2014     Imaging Studies:    08/12/14 Normal dopplers BPP 6/8, -2 for breathing, which is normal for gestational age. AFI 13. Biometry: 1088g, 35%tile    Medications:  Scheduled . aspirin EC  81 mg Oral Daily  . docusate sodium  100 mg Oral BID  . famotidine  20 mg Oral BID  . labetalol  50 mg Oral  BID  . prenatal multivitamin  1 tablet Oral Q1200   I have reviewed the patient's current medications.  ASSESSMENT: Patient Active Problem List   Diagnosis Date Noted  . Preterm premature rupture of membranes 08/06/2014  . Vaginal bleeding during pregnancy, antepartum   . Oligohydramnios in third trimester, antepartum 07/29/2014  . Obesity affecting pregnancy   . Prior poor obstetrical history in second trimester, antepartum   . Chronic hypertension complicating or reason for care during pregnancy 04/22/2014  . Previous cesarean delivery, antepartum 05/23/2012  . Hypertension 10/17/2011    PLAN: 1.  CHTN - BP controlled.  Continue NST twice daily.  Recheck growth in 3 weeks.  2.  PPROM - s/p latency antibiotics, NST twice daily.  No evidence of intrauterine infection.  Delivery for maternal or fetal concerns.  NST reactive last night.  AFI of 13 suggests reseal of membranes.  Recommendation from MFM is to continue PPROM management for now. Amnisure negative on 08/12/14; will recheck today.  If still negative, will talk to MFM about possible dye test.  3.  IUP - normal growth.   4.  Vaginal Bleeding - Continues to have occasional spotting, but no major bleeding episodes.  5. Musculoskeletal pain - Encouraged to get OOB, ambulate. Tylenol and Flexeril ordered orn back pain. Will continue to monitor.  Continue routine antenatal care.  Tereso Newcomer, MD 08/14/2014,9:22 AM

## 2014-08-15 DIAGNOSIS — O10913 Unspecified pre-existing hypertension complicating pregnancy, third trimester: Secondary | ICD-10-CM

## 2014-08-15 DIAGNOSIS — O4690 Antepartum hemorrhage, unspecified, unspecified trimester: Secondary | ICD-10-CM | POA: Diagnosis not present

## 2014-08-15 MED ORDER — ASPIRIN EC 81 MG PO TBEC: 81.0000 mg | DELAYED_RELEASE_TABLET | Freq: Every day | ORAL | Status: DC

## 2014-08-15 MED ORDER — LABETALOL HCL 100 MG PO TABS: 50.0000 mg | ORAL_TABLET | Freq: Two times a day (BID) | ORAL | Status: DC

## 2014-08-15 NOTE — Progress Notes (Signed)
Patient ID: Judy Wood, female   DOB: [birth year], 19 y.o.   MRN: [id number] FACULTY PRACTICE ANTEPARTUM NOTE  Judy Wood is a 19 y.o. G2P1001 at [redacted]w[redacted]d  who is admitted for Vaginal bleeding and oligohydramnios - suspected PPROM.  Negative Amnisure on 08/12/14 and 08/14/14.  Fetal presentation is cephalic.  Length of Stay:  17  Days  Subjective: Patient reports improved low back pain and body pain.  Patient reports good fetal movement.  She reports no uterine contractions, no bleeding and no loss of fluid per vagina.    Vitals:  Blood pressure 131/69, pulse 89, temperature 98.5 F (36.9 C), temperature source Oral, resp. rate 20, height 5\' 4"  (1.626 m), weight 259 lb (117.482 kg), last menstrual period 01/04/2014, SpO2 95 %. Physical Examination: General appearance - alert, well appearing, and in no distress Chest - clear to auscultation, no wheezes, rales or rhonchi, symmetric air entry Heart - normal rate, regular rhythm, normal S1, S2, no murmurs, rubs, clicks or gallops Abdomen - soft, nontender, nondistended, no masses or organomegaly Fundal Height:  size equals dates Extremities: extremities normal, atraumatic, no cyanosis or edema   Fetal Monitoring:  Baseline: 130s bpm, Variability: Good {> 6 bpm), Accelerations: Reactive and Decelerations: Absent  Labs:  Results for orders placed or performed during the hospital encounter of 07/29/14 (from the past 24 hour(s))  Amnisure rupture of membrane (rom)not at [facility]   Collection Time: 08/14/14  9:40 AM  Result Value Ref Range   Amnisure ROM NEGATIVE     Imaging Studies:    08/12/14 Normal dopplers BPP 6/8, -2 for breathing, which is normal for gestational age. AFI 13. Cephalic. Biometry: 1088g, 35%tile    Medications:  Scheduled . aspirin EC  81 mg Oral Daily  . docusate sodium  100 mg Oral BID  . famotidine  20 mg Oral BID  . labetalol  50 mg Oral BID  . prenatal multivitamin  1 tablet Oral Q1200   I have reviewed the  patient's current medications.  ASSESSMENT: Patient Active Problem List   Diagnosis Date Noted  . Preterm premature rupture of membranes 08/06/2014  . Vaginal bleeding during pregnancy, antepartum   . Oligohydramnios in third trimester, antepartum 07/29/2014  . Obesity affecting pregnancy   . Prior poor obstetrical history in second trimester, antepartum   . Chronic hypertension complicating or reason for care during pregnancy 04/22/2014  . Previous cesarean delivery, antepartum 05/23/2012  . Hypertension 10/17/2011    PLAN: 1.  CHTN - BP controlled.  Continue NST twice daily.  Recheck growth in 3 weeks.  2.  PPROM - s/p latency antibiotics, NST twice daily.  No evidence of intrauterine infection.  Delivery for maternal or fetal concerns.  NST reactive last night.  AFI of 13 suggests reseal of membranes; had severe oligohydramnios (AFI 0.6 cm) on admission on 07/29/14.  Recommendation from MFM is to continue PPROM management for now. Amnisure negative on 08/12/14 and 08/14/14.  May consult with MFM about possible dye test.  3.  IUP - normal growth.   4.  Vaginal Bleeding - Continues to have occasional spotting, but no major bleeding episodes.  5. Musculoskeletal pain - Encouraged to get OOB, ambulate. Tylenol and Flexeril ordered orn back pain. Will continue to monitor.  Continue routine antenatal care.  Tereso Newcomer, MD 08/15/2014,7:36 AM

## 2014-08-15 NOTE — Discharge Summary (Addendum)
Antenatal Physician Discharge Summary  Patient ID: DRAVEN MERANTE MRN: [id number] DOB/AGE: 1995-11-08 19 y.o.  Admit date: 07/29/2014 Discharge date: 08/15/2014  Admission Diagnoses: PROM  Discharge Diagnoses: PROM that has sealed over.  Chronic HTN   Prenatal Procedures: NST and ultrasound  Intrapartum Procedures: Neonatology, Maternal Fetal Medicine  Significant Diagnostic Studies:  Results for orders placed or performed during the hospital encounter of 07/29/14 (from the past 168 hour(s))  CBC   Collection Time: 08/08/14  5:00 PM  Result Value Ref Range   WBC 14.1 (H) 4.0 - 10.5 K/uL   RBC 3.31 (L) 3.87 - 5.11 MIL/uL   Hemoglobin 9.8 (L) 12.0 - 15.0 g/dL   HCT 63.8 (L) 45.3 - 64.6 %   MCV 89.1 78.0 - 100.0 fL   MCH 29.6 26.0 - 34.0 pg   MCHC 33.2 30.0 - 36.0 g/dL   RDW 80.3 21.2 - 24.8 %   Platelets 299 150 - 400 K/uL  Type and screen   Collection Time: 08/10/14  7:51 PM  Result Value Ref Range   ABO/RH(D) A POS    Antibody Screen NEG    Sample Expiration 08/13/2014   Amnisure rupture of membrane (rom)not at [facility]   Collection Time: 08/12/14  7:50 AM  Result Value Ref Range   Amnisure ROM NEGATIVE   Type and screen   Collection Time: 08/13/14  3:51 PM  Result Value Ref Range   ABO/RH(D) A POS    Antibody Screen NEG    Sample Expiration 08/16/2014   Amnisure rupture of membrane (rom)not at [facility]   Collection Time: 08/14/14  9:40 AM  Result Value Ref Range   Amnisure ROM NEGATIVE     Treatments: IV hydration, antibiotics: azithromycin and Amoxicillin and Ropcephin and steroids: Betamethasone  Hospital Course:  This is a 19 y.o. G2P1001 with IUP at [redacted]w[redacted]d admitted for PROM.  Pt reports that on the day of admission she was 'going to the bathroom frequently' she didn't think there was a problem because she was not having pain.  She went to [facility] and was transferred to [facility] hosp.  A sono revealed min amniotic fluid.  She was admitted and given latency antibiotics and  BMZ x2.  She was also treated with Labetolol and Ceftriaxone for an STI.  She denied LOF after several days and a repeat sono revealed an AFI of 13.  She had an Amnisure done on 6/14 and 6/16 which were both negative.  Pt reports +FM, No leakage of fluid and no VB.  She also denies ctx. She was initially started on magnesium sulfate for tocolysis and neuroprotection.  She was observed, fetal heart rate monitoring remained reassuring, and she had no signs/symptoms of PROM or other maternal-fetal concerns.  She was deemed stable for discharge to home with outpatient follow up.  Case discussed with Dr. Sherrie George who concurs with plan.  Discharge Exam: BP 121/44 mmHg  Pulse 97  Temp(Src) 98.2 F (36.8 C) (Oral)  Resp 18  Ht 5\' 4"  (1.626 m)  Wt 259 lb (117.482 kg)  BMI 44.44 kg/m2  SpO2 95%  LMP 01/04/2014 General appearance: alert and no distress  Discharge Condition: good  Disposition: 01-Home or Self Care     Medication List    ASK your doctor about these medications        acetaminophen 325 MG tablet  Commonly known as:  TYLENOL  Take 650 mg by mouth every 6 (six) hours as needed (pain).     aspirin  EC 81 MG tablet  Take 1 tablet (81 mg total) by mouth daily.     ibuprofen 200 MG tablet  Commonly known as:  ADVIL,MOTRIN  Take 200 mg by mouth every 6 (six) hours as needed (pain).     labetalol 100 MG tablet  Commonly known as:  NORMODYNE  Take 50 mg by mouth 2 (two) times daily.     prenatal vitamin w/FE, FA 27-1 MG Tabs tablet  Take 1 tablet by mouth daily at 12 noon.           Follow-up Information    Follow up with [facility] In 1 week.   Contact information:   [address] [place] [address] [phone]    Pt to f/u for sono with AFI 08/21/2014 @ 3:15pm   Signed: Willodean Rosenthal M.D. 08/15/2014, 2:23 PM

## 2014-08-15 NOTE — Discharge Instructions (Signed)
Preterm Labor Information °Preterm labor is when labor starts at less than 37 weeks of pregnancy. The normal length of a pregnancy is 39 to 41 weeks. °CAUSES °Often, there is no identifiable underlying cause as to why a woman goes into preterm labor. One of the most common known causes of preterm labor is infection. Infections of the uterus, cervix, vagina, amniotic sac, bladder, kidney, or even the lungs (pneumonia) can cause labor to start. Other suspected causes of preterm labor include:  °· Urogenital infections, such as yeast infections and bacterial vaginosis.   °· Uterine abnormalities (uterine shape, uterine septum, fibroids, or bleeding from the placenta).   °· A cervix that has been operated on (it may fail to stay closed).   °· Malformations in the fetus.   °· Multiple gestations (twins, triplets, and so on).   °· Breakage of the amniotic sac.   °RISK FACTORS °· Having a previous history of preterm labor.   °· Having premature rupture of membranes (PROM).   °· Having a placenta that covers the opening of the cervix (placenta previa).   °· Having a placenta that separates from the uterus (placental abruption).   °· Having a cervix that is too weak to hold the fetus in the uterus (incompetent cervix).   °· Having too much fluid in the amniotic sac (polyhydramnios).   °· Taking illegal drugs or smoking while pregnant.   °· Not gaining enough weight while pregnant.   °· Being younger than 18 and older than 21 years old.   °· Having a low socioeconomic status.   °· Being African American. °SYMPTOMS °Signs and symptoms of preterm labor include:  °· Menstrual-like cramps, abdominal pain, or back pain. °· Uterine contractions that are regular, as frequent as six in an hour, regardless of their intensity (may be mild or painful). °· Contractions that start on the top of the uterus and spread down to the lower abdomen and back.   °· A sense of increased pelvic pressure.   °· A watery or bloody mucus discharge that  comes from the vagina.   °TREATMENT °Depending on the length of the pregnancy and other circumstances, your health care provider may suggest bed rest. If necessary, there are medicines that can be given to stop contractions and to mature the fetal lungs. If labor happens before 34 weeks of pregnancy, a prolonged hospital stay may be recommended. Treatment depends on the condition of both you and the fetus.  °WHAT SHOULD YOU DO IF YOU THINK YOU ARE IN PRETERM LABOR? °Call your health care provider right away. You will need to go to the hospital to get checked immediately. °HOW CAN YOU PREVENT PRETERM LABOR IN FUTURE PREGNANCIES? °You should:  °· Stop smoking if you smoke.  °· Maintain healthy weight gain and avoid chemicals and drugs that are not necessary. °· Be watchful for any type of infection. °· Inform your health care provider if you have a known history of preterm labor. °Document Released: 05/07/2003 Document Revised: 10/17/2012 Document Reviewed: 03/19/2012 °ExitCare® Patient Information ©2015 [employer]. This information is not intended to replace advice given to you by your health care provider. Make sure you discuss any questions you have with your health care provider. °Premature Rupture and Preterm Premature Rupture of Membranes °Premature rupture of membranes (PROM) is when the membranes (amniotic sac) break open before contractions or labor starts. Rupture of membranes is commonly referred to as your water breaking. If PROM occurs before 37 weeks of pregnancy, it is called preterm premature rupture of membranes (PPROM). The amniotic sac holds the fetus, keeps infection out, and performs   other important functions. Having the amniotic sac rupture before 37 weeks of pregnancy can lead to serious problems and requires immediate attention by your health care provider. °CAUSES  °PROM near the end of the pregnancy may be caused by natural weakening of the membranes. PPROM is often due to an infection.  Other factors that may be associated with PROM include: °· Stretching of the amniotic sac because of carrying multiples or having too much amniotic fluid. °· Trauma. °· Smoking during pregnancy. °· Poor nutrition. °· Previous preterm birth. °· Vaginal bleeding. °· Little to no prenatal care. °· Problems with the placenta, such as placenta previa or placental abruption. °RISKS OF PROM AND PPROM °· Delivering a premature baby. °· Getting a serious infection of the placental tissues (chorioamnionitis). °· Early detachment of the placenta from the uterus (placental abruption). °· Compression of the umbilical cord. °· Needing a cesarean birth. °· Developing a serious infection after delivery. °SIGNS OF PROM OR PPROM  °· A sudden gush or slow leaking of fluid from the vagina. °· Constant wet underwear. °Sometimes, women mistake the leaking or wetness for urine, especially if the leak is slow and not a gush of fluid. If there is constant leaking or your underwear continues to get wet, your membranes have likely ruptured. °WHAT TO DO IF YOU THINK YOUR MEMBRANES HAVE RUPTURED °Call your health care provider right away. You will need to go to the hospital to get checked immediately. °WHAT HAPPENS IF YOU ARE DIAGNOSED WITH PROM OR PPROM? °Once you arrive at the hospital, you will have tests done. A cervical exam will be performed to check if the cervix has softened or started to open (dilate). If you are diagnosed with PROM, you may be induced within 24 hours if you are not having contractions. If you are diagnosed with PPROM and are not having contractions, you may be induced depending on your trimester.  °If you have PPROM, you: °· And your baby will be monitored closely for signs of infection or other complications. °· May be given an antibiotic medicine to lower the chances of an infection developing. °· May be given a steroid medicine to help mature the baby's lungs faster. °· May be given a medicine to stop preterm  labor. °· May be ordered to be on bed rest at home or in the hospital. °· May be induced if complications arise for you or the baby. °Your treatment will depend on many factors, such as how far along you are, the development of the baby, and other complications that may arise. °Document Released: 02/14/2005 Document Revised: 12/05/2012 Document Reviewed: 06/05/2012 °ExitCare® Patient Information ©2015 [employer]. This information is not intended to replace advice given to you by your health care provider. Make sure you discuss any questions you have with your health care provider. ° °

## 2014-08-18 ENCOUNTER — Encounter (HOSPITAL_COMMUNITY): Payer: Self-pay | Admitting: *Deleted

## 2014-08-18 ENCOUNTER — Inpatient Hospital Stay (HOSPITAL_COMMUNITY)
Admission: AD | Admit: 2014-08-18 | Discharge: 2014-08-18 | Disposition: A | Discharge status: Home / Self Care | Payer: Medicaid Other | Source: Ambulatory Visit | Attending: Obstetrics and Gynecology | Admitting: Obstetrics and Gynecology

## 2014-08-18 ENCOUNTER — Other Ambulatory Visit: Payer: Self-pay | Admitting: Obstetrics and Gynecology

## 2014-08-18 DIAGNOSIS — O42913 Preterm premature rupture of membranes, unspecified as to length of time between rupture and onset of labor, third trimester: Secondary | ICD-10-CM | POA: Diagnosis not present

## 2014-08-18 DIAGNOSIS — IMO0002 Reserved for concepts with insufficient information to code with codable children: Secondary | ICD-10-CM

## 2014-08-18 DIAGNOSIS — O4703 False labor before 37 completed weeks of gestation, third trimester: Secondary | ICD-10-CM | POA: Diagnosis not present

## 2014-08-18 DIAGNOSIS — R1030 Lower abdominal pain, unspecified: Secondary | ICD-10-CM | POA: Diagnosis present

## 2014-08-18 DIAGNOSIS — O42919 Preterm premature rupture of membranes, unspecified as to length of time between rupture and onset of labor, unspecified trimester: Secondary | ICD-10-CM

## 2014-08-18 DIAGNOSIS — O3421 Maternal care for scar from previous cesarean delivery: Secondary | ICD-10-CM | POA: Insufficient documentation

## 2014-08-18 DIAGNOSIS — Z3A29 29 weeks gestation of pregnancy: Secondary | ICD-10-CM | POA: Insufficient documentation

## 2014-08-18 DIAGNOSIS — O479 False labor, unspecified: Secondary | ICD-10-CM | POA: Diagnosis not present

## 2014-08-18 DIAGNOSIS — O34219 Maternal care for unspecified type scar from previous cesarean delivery: Secondary | ICD-10-CM

## 2014-08-18 LAB — URINALYSIS, ROUTINE W REFLEX MICROSCOPIC
Bilirubin Urine: NEGATIVE
Glucose, UA: NEGATIVE mg/dL
Hgb urine dipstick: NEGATIVE
KETONES UR: NEGATIVE mg/dL
LEUKOCYTES UA: NEGATIVE
NITRITE: NEGATIVE
Protein, ur: NEGATIVE mg/dL
SPECIFIC GRAVITY, URINE: 1.02 (ref 1.005–1.030)
Urobilinogen, UA: 0.2 mg/dL (ref 0.0–1.0)
pH: 7 (ref 5.0–8.0)

## 2014-08-18 MED ORDER — DOXYLAMINE SUCCINATE (SLEEP) 25 MG PO TABS: 25.0000 mg | ORAL_TABLET | Freq: Every evening | ORAL | Status: DC | PRN

## 2014-08-18 NOTE — MAU Provider Note (Signed)
Chief Complaint:  Leg Pain and Vaginal Pain   First Provider Initiated Contact with Patient 08/18/14 1956     HPI: Judy Wood is a 19 y.o. G2P1001 at [redacted]w[redacted]d by L/5 who presents to maternity admissions reporting cramping lower abdominal pain.  Reports for the past two days she has had "crampy" abdominal pain like a period. Felt like pain would go between legs into her feet. States that pain is worse at night and keeps her awake. No resolution with PO tylenol. She states that pain is not relieved with pregnancy pillow or hot bath, but did get much better with being in the pool.  Some nausea this morning and vomited x 2; no diarrhea  Denies contractions, leakage of fluid or vaginal bleeding. Good fetal movement.   Pregnancy Course:  ? PROM, likely sealed over cHTN Obesity  Past Medical History: Past Medical History  Diagnosis Date  . Hypertension     on meds  . Hernia, umbilical   . Hypertension   . H/O umbilical hernia repair   . Asthma     no current meds needed    Past obstetric history: OB History  Gravida Para Term Preterm AB SAB TAB Ectopic Multiple Living  2 1 1       1     # Outcome Date GA Lbr Len/2nd Weight Sex Delivery Anes PTL Lv  2 Current           1 Term 05/19/12 [redacted]w[redacted]d  6 lb 5.9 oz (2.889 kg) F CS-Classical EPI  Y      Past Surgical History: Past Surgical History  Procedure Laterality Date  . Umbilical hernia repair      as a child  . Umbilical hernia repair    . Cesarean section N/A 05/19/2012    Procedure:  Primary CESAREAN SECTION  of baby girl  at 1933  APGAR 4/5/5;  Surgeon: Adam Phenix, MD;  Location: [facility];  Service: Obstetrics;  Laterality: N/A;     Family History: Family History  Problem Relation Age of Onset  . Hypertension Mother   . Anemia Mother   . Depression Mother   . Deep vein thrombosis Maternal Grandmother   . Diabetes Maternal Grandmother   . Kidney disease Maternal Grandmother   . Cancer Maternal Grandmother     colon  cancer  . Diabetes Father     Social History: History  Substance Use Topics  . Smoking status: Light Tobacco Smoker -- 0.25 packs/day    Types: Cigarettes  . Smokeless tobacco: Never Used  . Alcohol Use: No     Comment: Stopped smoking marijuana 3 weeks ago    Allergies:  Allergies  Allergen Reactions  . Banana Anaphylaxis  . Lactose Intolerance (Gi) Diarrhea and Nausea Only  . Sulfa Antibiotics Other (See Comments)    Childhood reaction    Meds:  No prescriptions prior to admission    ROS:  ROS  Physical Exam  Blood pressure 142/77, pulse 90, temperature 98.5 F (36.9 C), temperature source Oral, resp. rate 18, height 5\' 4"  (1.626 m), weight 264 lb 4 oz (119.863 kg), last menstrual period 01/04/2014, SpO2 100 %. GENERAL: Well-developed, well-nourished female in no acute distress.  HEART: normal rate RESP: normal effort GI: Abd soft, non-tender, gravid appropriate for gestational age. Pos BS x 4 MS: Extremities nontender, no edema, normal ROM NEURO: Alert and oriented x 4.  GU: NEFG, physiologic discharge, no blood, cervix clean. No CVAT Dilation: Closed Exam by:: Dr  Su Hilt  FHT:  Baseline 135 , moderate variability, accelerations present, no decelerations Contractions: quiet   Labs: Results for orders placed or performed during the hospital encounter of 08/18/14 (from the past 24 hour(s))  Urinalysis, Routine w reflex microscopic (not at [facility])     Status: None   Collection Time: 08/18/14  7:24 PM  Result Value Ref Range   Color, Urine YELLOW YELLOW   APPearance CLEAR CLEAR   Specific Gravity, Urine 1.020 1.005 - 1.030   pH 7.0 5.0 - 8.0   Glucose, UA NEGATIVE NEGATIVE mg/dL   Hgb urine dipstick NEGATIVE NEGATIVE   Bilirubin Urine NEGATIVE NEGATIVE   Ketones, ur NEGATIVE NEGATIVE mg/dL   Protein, ur NEGATIVE NEGATIVE mg/dL   Urobilinogen, UA 0.2 0.0 - 1.0 mg/dL   Nitrite NEGATIVE NEGATIVE   Leukocytes, UA NEGATIVE NEGATIVE    Imaging:  US Ob Follow  Up  08/12/2014   OBSTETRICAL ULTRASOUND: This exam was performed within a [place] [facility]. The OB US report was generated in the AS system, and faxed to the ordering physician.   This report is available in the [employer]. See the AS Obstetric US report via the Image Link.  US Ob Follow Up  07/29/2014   OBSTETRICAL ULTRASOUND: This exam was performed within a [place] [facility]. The OB US report was generated in the AS system, and faxed to the ordering physician.   This report is available in the [employer]. See the AS Obstetric US report via the Image Link.  US Fetal Bpp W/o Non Stress  08/12/2014   OBSTETRICAL ULTRASOUND: This exam was performed within a [facility]. The OB US report was generated in the AS system, and faxed to the ordering physician.   This report is available in the [employer]. See the AS Obstetric US report via the Image Link.  US Fetal Bpp W/o Non Stress  08/04/2014   OBSTETRICAL ULTRASOUND: This exam was performed within a [place] [facility]. The OB US report was generated in the AS system, and faxed to the ordering physician.   This report is available in the [employer]. See the AS Obstetric US report via the Image Link.  [place] Ua Cord Doppler  08/12/2014   OBSTETRICAL ULTRASOUND: This exam was performed within a [facility]. The OB US report was generated in the AS system, and faxed to the ordering physician.   This report is available in the [employer]. See the AS Obstetric US report via the Image Link.  [place] Ua Cord Doppler  08/04/2014   OBSTETRICAL ULTRASOUND: This exam was performed within a [facility] [facility]. The OB US report was generated in the AS system, and faxed to the ordering physician.   This report is available in the [employer]. See the AS Obstetric US report via the Image Link.  [place] Ua Cord Doppler  07/29/2014   OBSTETRICAL ULTRASOUND: This exam was  performed within a [place] [facility]. The OB US report was generated in the AS system, and faxed to the ordering physician.   This report is available in the [employer]. See the AS Obstetric US report via the Image Link.   MAU Course: SSE with no pooling and cervix visually very closed, thick and high SVE with cervix closed/thick/high  No complaints of VB or leaking fluid, therefore will not perform AFI or amnisure  Assessment: 1. Preterm premature rupture of membranes, unspecified duration to onset  of labor   2. Previous cesarean delivery, antepartum   3. Braxton Hicks contractions     Plan:  Round ligament pain discussed Trial of unisom for nausea and sleep Discharge home in stable condition.  Pre-term Labor precautions and fetal kick counts     Follow-up Information    Schedule an appointment as soon as possible for a visit to follow up.   Why:  If symptoms worsen         Medication List    TAKE these medications        acetaminophen 325 MG tablet  Commonly known as:  TYLENOL  Take 650 mg by mouth every 6 (six) hours as needed (pain).     aspirin EC 81 MG tablet  Take 1 tablet (81 mg total) by mouth daily.     doxylamine (Sleep) 25 MG tablet  Commonly known as:  UNISOM  Take 1 tablet (25 mg total) by mouth at bedtime as needed.     labetalol 100 MG tablet  Commonly known as:  NORMODYNE  Take 0.5 tablets (50 mg total) by mouth 2 (two) times daily.     prenatal vitamin w/FE, FA 27-1 MG Tabs tablet  Take 1 tablet by mouth daily at 12 noon.        Ethelda Chick, MD 08/18/2014 10:43 PM

## 2014-08-18 NOTE — MAU Note (Signed)
PT  SAYS  SHE STARTED HAVING  PAIN IN HER GROINS  - BOTH   THAT  STARTED  ON SAT-  WHILE  SHE WAS ASLEEP- - SHE WAS HAVING  TROUBLE   BREATHING- HAS  CONTINUED ALL WEEKEND .  PNC-   AT CLINIC -  SHE CALLED  THEM AT 6PM  TONIGHT  - TOLD  HER TO COME IN.       DENIES HSV AND MRSA.    LAST SEX-  THINKS  June  .

## 2014-08-18 NOTE — Discharge Instructions (Signed)

## 2014-08-19 ENCOUNTER — Ambulatory Visit (HOSPITAL_COMMUNITY): Payer: Medicaid Other

## 2014-08-21 ENCOUNTER — Ambulatory Visit (HOSPITAL_COMMUNITY)
Admit: 2014-08-21 | Discharge: 2014-08-21 | Disposition: A | Discharge status: Home / Self Care | Payer: Medicaid Other | Attending: Family Medicine | Admitting: Family Medicine

## 2014-08-21 ENCOUNTER — Encounter (HOSPITAL_COMMUNITY): Payer: Self-pay

## 2014-08-21 ENCOUNTER — Other Ambulatory Visit (HOSPITAL_COMMUNITY): Payer: Self-pay | Admitting: Maternal and Fetal Medicine

## 2014-08-21 ENCOUNTER — Other Ambulatory Visit: Payer: Self-pay | Admitting: Obstetrics and Gynecology

## 2014-08-21 VITALS — BP 145/76 | HR 108 | Wt 261.2 lb

## 2014-08-21 DIAGNOSIS — O4290 Premature rupture of membranes, unspecified as to length of time between rupture and onset of labor, unspecified weeks of gestation: Secondary | ICD-10-CM | POA: Insufficient documentation

## 2014-08-21 DIAGNOSIS — O34219 Maternal care for unspecified type scar from previous cesarean delivery: Secondary | ICD-10-CM

## 2014-08-21 DIAGNOSIS — O42913 Preterm premature rupture of membranes, unspecified as to length of time between rupture and onset of labor, third trimester: Secondary | ICD-10-CM | POA: Diagnosis not present

## 2014-08-21 DIAGNOSIS — O163 Unspecified maternal hypertension, third trimester: Secondary | ICD-10-CM | POA: Insufficient documentation

## 2014-08-21 DIAGNOSIS — IMO0002 Reserved for concepts with insufficient information to code with codable children: Secondary | ICD-10-CM

## 2014-08-21 DIAGNOSIS — Z3A29 29 weeks gestation of pregnancy: Secondary | ICD-10-CM | POA: Insufficient documentation

## 2014-08-21 DIAGNOSIS — O42919 Preterm premature rupture of membranes, unspecified as to length of time between rupture and onset of labor, unspecified trimester: Secondary | ICD-10-CM

## 2014-08-21 DIAGNOSIS — O3421 Maternal care for scar from previous cesarean delivery: Secondary | ICD-10-CM | POA: Insufficient documentation

## 2014-08-25 ENCOUNTER — Ambulatory Visit (INDEPENDENT_AMBULATORY_CARE_PROVIDER_SITE_OTHER): Payer: Medicaid Other | Admitting: Obstetrics and Gynecology

## 2014-08-25 VITALS — BP 125/78 | HR 92 | Temp 98.2°F | Wt 261.8 lb

## 2014-08-25 DIAGNOSIS — O4703 False labor before 37 completed weeks of gestation, third trimester: Secondary | ICD-10-CM

## 2014-08-25 DIAGNOSIS — O10913 Unspecified pre-existing hypertension complicating pregnancy, third trimester: Secondary | ICD-10-CM | POA: Diagnosis not present

## 2014-08-25 DIAGNOSIS — I10 Essential (primary) hypertension: Secondary | ICD-10-CM | POA: Diagnosis not present

## 2014-08-25 DIAGNOSIS — O42919 Preterm premature rupture of membranes, unspecified as to length of time between rupture and onset of labor, unspecified trimester: Secondary | ICD-10-CM | POA: Diagnosis not present

## 2014-08-25 DIAGNOSIS — O10912 Unspecified pre-existing hypertension complicating pregnancy, second trimester: Secondary | ICD-10-CM

## 2014-08-25 MED ORDER — NIFEDIPINE 10 MG PO CAPS: ORAL_CAPSULE | ORAL | Status: DC

## 2014-08-25 NOTE — Progress Notes (Signed)
Patient reports horrible cramping at night; reports being in a lot of pain all the time Reviewed tip of week with patient

## 2014-08-25 NOTE — Progress Notes (Signed)
Subjective:  Judy Wood is a 19 y.o. G2P1001 at [redacted]w[redacted]d being seen today for ongoing prenatal care. Contractions: Not present.  Vag. Bleeding: None. Movement: Present.   She reports ongoing crampy pain, worse at night. Feels like a period cramp and feels like she can't sleep. Denies any bleeding, no leakage of fluid.  Some wetness in underwear with cramps, brownish-clearish-white.   Does endorse a subjective fever last night, just went to sleep. Didn't take any medicines. Currently afebrile.  The following portions of the patient's history were reviewed and updated as appropriate: allergies, current medications, past family history, past medical history, past social history, past surgical history and problem list.   Objective:   Filed Vitals:   08/25/14 0949  BP: 125/78  Pulse: 92  Temp: 98.2 F (36.8 C)  Weight: 261 lb 12.8 oz (118.752 kg)    Fetal Status: Fetal Heart Rate (bpm): 131 Fundal Height: 31 cm Movement: Present     General:  Alert, oriented and cooperative. Patient is in no acute distress.  Skin: Skin is warm and dry. No rash noted.   Cardiovascular: Normal heart rate noted  Respiratory: Normal respiratory effort, no problems with respiration noted  Abdomen: Soft, gravid, appropriate for gestational age. Pain/Pressure: Present     Vaginal: Vag. Bleeding: None.    Vag D/C Character: Thin  Cervix: Exam revealed Dilation: Closed Effacement (%): Thick Station: Ballotablec/t/h; No pooling on SSE. Vaginal discharge white without any evidence of brown or bleeding. No foul smell  Extremities: Normal range of motion.  Edema: Trace  Mental Status: Normal mood and affect. Normal behavior. Normal judgment and thought content.   Urinalysis:      Assessment and Plan:  Pregnancy: G2P1001 at [redacted]w[redacted]d  #) Preterm contractions - Ongoing and causing stress - SSE and SVE w/ cervix c/t/h - no evidence of PTL - procardia for contractiosn prescribed  #) ?PPROM  - Prev AFI <1.  AFI at  discharge 13 with 2 negative AMNISURE tests.  Pt d/c'd 08/15/2014   - Nonspecific complaints, but no e/o ROM on SSE today - Strict MAU precautions with discharge, bleeding, fevers. Afebrile today, despite subjective reports of fever last PM  Preterm labor symptoms and general obstetric precautions including but not limited to vaginal bleeding, contractions, leaking of fluid and fetal movement were reviewed in detail with the patient.  Please refer to After Visit Summary for other counseling recommendations.   Return in about 1 week (around 09/01/2014) for hrob.   Ethelda Chick, MD

## 2014-08-26 ENCOUNTER — Encounter: Payer: Self-pay | Admitting: General Practice

## 2014-08-26 ENCOUNTER — Ambulatory Visit (HOSPITAL_COMMUNITY): Payer: Medicaid Other

## 2014-08-30 ENCOUNTER — Encounter (HOSPITAL_COMMUNITY): Payer: Self-pay | Admitting: Anesthesiology

## 2014-08-30 ENCOUNTER — Encounter (HOSPITAL_COMMUNITY): Payer: Self-pay | Admitting: *Deleted

## 2014-08-30 ENCOUNTER — Inpatient Hospital Stay (HOSPITAL_COMMUNITY)
Admission: AD | Admit: 2014-08-30 | Discharge: 2014-09-06 | DRG: 781 | Disposition: A | Discharge status: Home / Self Care | Payer: Medicaid Other | Source: Ambulatory Visit | Attending: Obstetrics & Gynecology | Admitting: Obstetrics & Gynecology

## 2014-08-30 ENCOUNTER — Inpatient Hospital Stay (HOSPITAL_COMMUNITY): Payer: Medicaid Other

## 2014-08-30 DIAGNOSIS — O09293 Supervision of pregnancy with other poor reproductive or obstetric history, third trimester: Secondary | ICD-10-CM | POA: Diagnosis not present

## 2014-08-30 DIAGNOSIS — O42913 Preterm premature rupture of membranes, unspecified as to length of time between rupture and onset of labor, third trimester: Secondary | ICD-10-CM | POA: Diagnosis present

## 2014-08-30 DIAGNOSIS — O99343 Other mental disorders complicating pregnancy, third trimester: Secondary | ICD-10-CM | POA: Diagnosis present

## 2014-08-30 DIAGNOSIS — A539 Syphilis, unspecified: Secondary | ICD-10-CM

## 2014-08-30 DIAGNOSIS — O98113 Syphilis complicating pregnancy, third trimester: Secondary | ICD-10-CM | POA: Diagnosis present

## 2014-08-30 DIAGNOSIS — O10913 Unspecified pre-existing hypertension complicating pregnancy, third trimester: Secondary | ICD-10-CM | POA: Diagnosis present

## 2014-08-30 DIAGNOSIS — Z8249 Family history of ischemic heart disease and other diseases of the circulatory system: Secondary | ICD-10-CM

## 2014-08-30 DIAGNOSIS — O98119 Syphilis complicating pregnancy, unspecified trimester: Secondary | ICD-10-CM | POA: Diagnosis present

## 2014-08-30 DIAGNOSIS — O4593 Premature separation of placenta, unspecified, third trimester: Principal | ICD-10-CM | POA: Diagnosis present

## 2014-08-30 DIAGNOSIS — O10919 Unspecified pre-existing hypertension complicating pregnancy, unspecified trimester: Secondary | ICD-10-CM | POA: Diagnosis present

## 2014-08-30 DIAGNOSIS — F329 Major depressive disorder, single episode, unspecified: Secondary | ICD-10-CM | POA: Diagnosis present

## 2014-08-30 DIAGNOSIS — O365930 Maternal care for other known or suspected poor fetal growth, third trimester, not applicable or unspecified: Secondary | ICD-10-CM | POA: Diagnosis present

## 2014-08-30 DIAGNOSIS — O99820 Streptococcus B carrier state complicating pregnancy: Secondary | ICD-10-CM

## 2014-08-30 DIAGNOSIS — O34219 Maternal care for unspecified type scar from previous cesarean delivery: Secondary | ICD-10-CM | POA: Diagnosis present

## 2014-08-30 DIAGNOSIS — O4690 Antepartum hemorrhage, unspecified, unspecified trimester: Secondary | ICD-10-CM | POA: Diagnosis present

## 2014-08-30 DIAGNOSIS — Z3A34 34 weeks gestation of pregnancy: Secondary | ICD-10-CM | POA: Diagnosis present

## 2014-08-30 DIAGNOSIS — O42919 Preterm premature rupture of membranes, unspecified as to length of time between rupture and onset of labor, unspecified trimester: Secondary | ICD-10-CM

## 2014-08-30 DIAGNOSIS — F17210 Nicotine dependence, cigarettes, uncomplicated: Secondary | ICD-10-CM | POA: Diagnosis present

## 2014-08-30 DIAGNOSIS — O99333 Smoking (tobacco) complicating pregnancy, third trimester: Secondary | ICD-10-CM | POA: Diagnosis present

## 2014-08-30 DIAGNOSIS — Z833 Family history of diabetes mellitus: Secondary | ICD-10-CM

## 2014-08-30 DIAGNOSIS — Z3A31 31 weeks gestation of pregnancy: Secondary | ICD-10-CM | POA: Diagnosis not present

## 2014-08-30 DIAGNOSIS — O3421 Maternal care for scar from previous cesarean delivery: Secondary | ICD-10-CM | POA: Diagnosis present

## 2014-08-30 HISTORY — DX: Syphilis, unspecified: A53.9

## 2014-08-30 LAB — RAPID URINE DRUG SCREEN, HOSP PERFORMED
Amphetamines: NOT DETECTED
BENZODIAZEPINES: NOT DETECTED
Barbiturates: NOT DETECTED
Cocaine: NOT DETECTED
OPIATES: NOT DETECTED
Tetrahydrocannabinol: NOT DETECTED

## 2014-08-30 LAB — URINALYSIS, ROUTINE W REFLEX MICROSCOPIC
BILIRUBIN URINE: NEGATIVE
Glucose, UA: NEGATIVE mg/dL
Ketones, ur: 15 mg/dL — AB
Nitrite: NEGATIVE
Protein, ur: NEGATIVE mg/dL
SPECIFIC GRAVITY, URINE: 1.025 (ref 1.005–1.030)
Urobilinogen, UA: 1 mg/dL (ref 0.0–1.0)
pH: 6 (ref 5.0–8.0)

## 2014-08-30 LAB — COMPREHENSIVE METABOLIC PANEL
ALT: 9 U/L — ABNORMAL LOW (ref 14–54)
AST: 15 U/L (ref 15–41)
Albumin: 3.1 g/dL — ABNORMAL LOW (ref 3.5–5.0)
Alkaline Phosphatase: 82 U/L (ref 38–126)
Anion gap: 5 (ref 5–15)
BUN: 5 mg/dL — ABNORMAL LOW (ref 6–20)
CHLORIDE: 106 mmol/L (ref 101–111)
CO2: 21 mmol/L — AB (ref 22–32)
Calcium: 8.6 mg/dL — ABNORMAL LOW (ref 8.9–10.3)
Creatinine, Ser: 0.43 mg/dL — ABNORMAL LOW (ref 0.44–1.00)
GFR calc non Af Amer: >60 mL/min (ref >60)
GLUCOSE: 107 mg/dL — AB (ref 65–99)
POTASSIUM: 3.3 mmol/L — AB (ref 3.5–5.1)
Sodium: 132 mmol/L — ABNORMAL LOW (ref 135–145)
Total Bilirubin: 0.4 mg/dL (ref 0.3–1.2)
Total Protein: 6.6 g/dL (ref 6.5–8.1)

## 2014-08-30 LAB — CBC
HCT: 29.5 % — ABNORMAL LOW (ref 36.0–46.0)
Hemoglobin: 9.8 g/dL — ABNORMAL LOW (ref 12.0–15.0)
MCH: 29.2 pg (ref 26.0–34.0)
MCHC: 33.2 g/dL (ref 30.0–36.0)
MCV: 87.8 fL (ref 78.0–100.0)
PLATELETS: 269 10*3/uL (ref 150–400)
RBC: 3.36 MIL/uL — ABNORMAL LOW (ref 3.87–5.11)
RDW: 13.1 % (ref 11.5–15.5)
WBC: 8.3 10*3/uL (ref 4.0–10.5)

## 2014-08-30 LAB — URINE MICROSCOPIC-ADD ON

## 2014-08-30 LAB — TYPE AND SCREEN
ABO/RH(D): A POS
Antibody Screen: NEGATIVE

## 2014-08-30 MED ORDER — ZOLPIDEM TARTRATE 5 MG PO TABS: 5.0000 mg | ORAL_TABLET | Freq: Every evening | ORAL | Status: DC | PRN

## 2014-08-30 MED ORDER — CALCIUM CARBONATE ANTACID 500 MG PO CHEW
2.0000 | CHEWABLE_TABLET | ORAL | Status: DC | PRN
  Filled 2014-08-30: qty 2

## 2014-08-30 MED ORDER — PRENATAL MULTIVITAMIN CH
1.0000 | ORAL_TABLET | Freq: Every day | ORAL | Status: DC
  Administered 2014-08-31 – 2014-09-01 (×2): 1 via ORAL
  Filled 2014-08-30 (×5): qty 1

## 2014-08-30 MED ORDER — DOCUSATE SODIUM 100 MG PO CAPS
100.0000 mg | ORAL_CAPSULE | Freq: Every day | ORAL | Status: DC
  Filled 2014-08-30 (×5): qty 1

## 2014-08-30 MED ORDER — PROMETHAZINE HCL 25 MG PO TABS: 25.0000 mg | ORAL_TABLET | Freq: Three times a day (TID) | ORAL | Status: DC | PRN

## 2014-08-30 MED ORDER — ACETAMINOPHEN 325 MG PO TABS
650.0000 mg | ORAL_TABLET | ORAL | Status: DC | PRN
  Administered 2014-08-30 – 2014-09-06 (×6): 650 mg via ORAL
  Filled 2014-08-30 (×7): qty 2

## 2014-08-30 MED ORDER — LACTATED RINGERS IV BOLUS (SEPSIS)
1000.0000 mL | Freq: Once | INTRAVENOUS | Status: AC
  Administered 2014-08-30: 1000 mL via INTRAVENOUS

## 2014-08-30 MED ORDER — ONDANSETRON 4 MG PO TBDP
4.0000 mg | ORAL_TABLET | Freq: Three times a day (TID) | ORAL | Status: DC | PRN
  Administered 2014-08-30: 4 mg via ORAL
  Filled 2014-08-30 (×2): qty 1

## 2014-08-30 MED ORDER — SERTRALINE HCL 50 MG PO TABS
50.0000 mg | ORAL_TABLET | Freq: Every day | ORAL | Status: DC
  Administered 2014-08-30 – 2014-09-05 (×7): 50 mg via ORAL
  Filled 2014-08-30 (×9): qty 1

## 2014-08-30 NOTE — Progress Notes (Signed)
Pt went to ultrasound, then admitted to rm 162

## 2014-08-30 NOTE — H&P (Signed)
FACULTY PRACTICE ANTEPARTUM ADMISSION HISTORY AND PHYSICAL NOTE   History of Present Illness: Judy Wood is a 19 y.o. G2P1001 at 5321w6d admitted for Vaginal bleeding Patient reports the fetal movement as active. Patient reports uterine contraction  activity as none. Patient reports  vaginal bleeding as less flow than a normal period. Patient describes fluid per vagina as None. Fetal presentation is cephalic.  Patient Active Problem List   Diagnosis Date Noted  . Premature rupture of membranes   . [redacted] weeks gestation of pregnancy   . Preterm premature rupture of membranes 08/06/2014  . Vaginal bleeding during pregnancy, antepartum   . Oligohydramnios in third trimester, antepartum 07/29/2014  . Obesity affecting pregnancy   . Prior poor obstetrical history in second trimester, antepartum   . Chronic hypertension complicating or reason for care during pregnancy 04/22/2014  . Previous cesarean delivery, antepartum 05/23/2012  . Hypertension 10/17/2011    Past Medical History  Diagnosis Date  . Hypertension     on meds  . Hernia, umbilical   . Hypertension   . H/O umbilical hernia repair   . Asthma     no current meds needed    Past Surgical History  Procedure Laterality Date  . Umbilical hernia repair      as a child  . Umbilical hernia repair    . Cesarean section N/A 05/19/2012    Procedure:  Primary CESAREAN SECTION  of baby girl  at 1933  APGAR 4/5/5;  Surgeon: Adam PhenixJames G Arnold, MD;  Location: [facility];  Service: Obstetrics;  Laterality: N/A;    OB History  Gravida Para Term Preterm AB SAB TAB Ectopic Multiple Living  2 1 1       1     # Outcome Date GA Lbr Len/2nd Weight Sex Delivery Anes PTL Lv  2 Current           1 Term 05/19/12 544w2d  6 lb 5.9 oz (2.889 kg) F CS-Classical EPI  Y      History   Social History  . Marital Status: Single    Spouse Name: N/A  . Number of Children: N/A  . Years of Education: N/A   Social History Main Topics  . Smoking  status: Light Tobacco Smoker -- 0.25 packs/day    Types: Cigarettes  . Smokeless tobacco: Never Used  . Alcohol Use: No     Comment: Stopped smoking marijuana 3 weeks ago  . Drug Use: Yes    Special: Marijuana     Comment: Stopped when she found out she was pregnant   . Sexual Activity: Yes    Birth Control/ Protection: None, Pill   Other Topics Concern  . None   Social History Narrative    Family History  Problem Relation Age of Onset  . Hypertension Mother   . Anemia Mother   . Depression Mother   . Deep vein thrombosis Maternal Grandmother   . Diabetes Maternal Grandmother   . Kidney disease Maternal Grandmother   . Cancer Maternal Grandmother     colon cancer  . Diabetes Father     Allergies  Allergen Reactions  . Banana Anaphylaxis  . Lactose Intolerance (Gi) Diarrhea and Nausea Only  . Sulfa Antibiotics Other (See Comments)    Childhood reaction    Prescriptions prior to admission  Medication Sig Dispense Refill Last Dose  . aspirin EC 81 MG tablet Take 1 tablet (81 mg total) by mouth daily. 90 tablet 3 08/29/2014 at Unknown time  .  labetalol (NORMODYNE) 100 MG tablet Take 0.5 tablets (50 mg total) by mouth 2 (two) times daily. 60 tablet 2 08/29/2014 at 9:00am  . prenatal vitamin w/FE, FA (PRENATAL 1 + 1) 27-1 MG TABS tablet Take 1 tablet by mouth daily at 12 noon.   08/30/2014 at Unknown time  . acetaminophen (TYLENOL) 325 MG tablet Take 650 mg by mouth every 6 (six) hours as needed (pain).   prn  . NIFEdipine (PROCARDIA) 10 MG capsule Take 1 tablet with contractions. If contractions persist, can take additional 10 mg 10 minutes later. Do not exceed 40 mg in 1 hour. Can repeat dosage in 3 hours. (Patient not taking: Reported on 08/30/2014) 30 capsule 3     Review of Systems - General ROS: positive for  - sleep disturbance Psychological ROS: positive for - depression Gastrointestinal ROS: positive for - abdominal pain and diarrhea Genito-Urinary ROS: positive for -  nocturia and vaginal bleeding  Vitals:  BP 128/80 mmHg  Pulse 97  Temp(Src) 98 F (36.7 C) (Oral)  Resp 18  Ht 5\' 3"  (1.6 m)  Wt 261 lb (118.389 kg)  BMI 46.25 kg/m2  LMP 01/04/2014 Physical Examination: CONSTITUTIONAL: Well-developed, well-nourished female in no mild distress.  EYES: Conjunctivae and EOM are normal. NECK: Normal range of motion SKIN: Skin is warm and dry. No rash noted. NEUROLGIC: Alert and oriented to person, place, and time.  PSYCHIATRIC: Depressed mood and very emotional.  CARDIOVASCULAR: Normal heart rate noted, regular rhythm RESPIRATORY: Effort and breath sounds normal, no problems with respiration noted ABDOMEN: Soft, nontender, nondistended, gravid. MUSCULOSKELETAL: Normal range of motion. No edema and no tenderness. 2+ distal pulses.  Blood noted on feet and on pad.   Cervix: Not evaluated. Patient refused speculum exam.  Fetal Monitoring:Baseline: 140 bpm and Variability: Good {> 6 bpm)   Labs:  No results found for this or any previous visit (from the past 24 hour(s)).  Imaging Studies: US Ob Follow Up  08/12/2014   OBSTETRICAL ULTRASOUND: This exam was performed within a [facility]. The OB US report was generated in the AS system, and faxed to the ordering physician.   This report is available in the [employer]. See the AS Obstetric US report via the Image Link.  US Fetal Bpp W/o Non Stress  08/27/2014   OBSTETRICAL ULTRASOUND: This exam was performed within a [facility]. The OB US report was generated in the AS system, and faxed to the ordering physician.   This report is available in the [employer]. See the AS Obstetric US report via the Image Link.  US Fetal Bpp W/o Non Stress  08/12/2014   OBSTETRICAL ULTRASOUND: This exam was performed within a [facility]. The OB US report was generated in the AS system, and faxed to the ordering physician.   This report is available in  the [employer]. See the AS Obstetric US report via the Image Link.  US Fetal Bpp W/o Non Stress  08/04/2014   OBSTETRICAL ULTRASOUND: This exam was performed within a [facility]. The OB US report was generated in the AS system, and faxed to the ordering physician.   This report is available in the [employer]. See the AS Obstetric US report via the Image Link.  [place] Ua Cord Doppler  08/12/2014   OBSTETRICAL ULTRASOUND: This exam was performed within a [facility]. The OB US report was generated in the AS system, and faxed to  the ordering physician.   This report is available in the [employer]. See the AS Obstetric US report via the Image Link.  [place] Ua Cord Doppler  08/04/2014   OBSTETRICAL ULTRASOUND: This exam was performed within a [place] [facility]. The OB US report was generated in the AS system, and faxed to the ordering physician.   This report is available in the [employer]. See the AS Obstetric US report via the Image Link.    Assessment and Plan: Patient Active Problem List   Diagnosis Date Noted  . Premature rupture of membranes   . [redacted] weeks gestation of pregnancy   . Preterm premature rupture of membranes 08/06/2014  . Vaginal bleeding during pregnancy, antepartum   . Oligohydramnios in third trimester, antepartum 07/29/2014  . Obesity affecting pregnancy   . Prior poor obstetrical history in second trimester, antepartum   . Chronic hypertension complicating or reason for care during pregnancy 04/22/2014  . Previous cesarean delivery, antepartum 05/23/2012  . Hypertension 10/17/2011   Admit to Antenatal Routine antenatal care UA and UDS obtained  Zoloft for depression Obtaining transvaginal and transabdominal U/S   De Hollingshead, MD PGY-1 Faculty Practice, [facility]

## 2014-08-30 NOTE — MAU Note (Signed)
Pt states here for vaginal bleeding. Sat down and had blood all over toilet seat. Has felt pains 6-7 times in one hour. Pt states is under a lot of stress, and has had to do a lot of walking, however is supposed to limit activity. PROM 1-2 months ago. Now sees blood when wiping. Tissue in place.

## 2014-08-30 NOTE — Plan of Care (Signed)
Problem: Consults Goal: Birthing Suites Patient Information Press F2 to bring up selections list Outcome: Completed/Met Date Met:  08/30/14  Pt < [redacted] weeks EGA

## 2014-08-30 NOTE — MAU Provider Note (Signed)
Note entered in error. See H&P from 08/30/14.  Marcy Sirenatherine Wallace, D.O. 09/29/2014, 4:32 PM PGY-1, [facility]  HPI   Review of Systems  Constitutional: Negative for fever, chills and malaise/fatigue.  HENT: Negative for congestion.   Eyes: Negative for blurred vision and double vision.  Respiratory: Negative for cough and shortness of breath.   Cardiovascular: Negative for chest pain, palpitations, claudication and leg swelling.  Gastrointestinal: Positive for abdominal pain and diarrhea. Negative for heartburn, nausea, vomiting and constipation.  Genitourinary: Negative for dysuria and hematuria.  Musculoskeletal: Negative for myalgias and back pain.  Skin: Negative for itching and rash.  Neurological: Negative for dizziness, loss of consciousness and headaches.  Physical Exam  Constitutional: She is oriented to person, place, and time. She appears well-developed and well-nourished. No distress.  HENT:  Head: Normocephalic and atraumatic.  Eyes: Conjunctivae and EOM are normal.  Neck: Normal range of motion. No thyromegaly present.  Cardiovascular: Normal rate, regular rhythm and normal heart sounds.  Exam reveals no gallop and no friction rub.   No murmur heard. Respiratory: Breath sounds normal. No respiratory distress. She has no wheezes. She has no rales.  GI: Soft. Bowel sounds are normal. She exhibits no distension. There is no tenderness.  Musculoskeletal: Normal range of motion. She exhibits no edema.  Neurological: She is alert and oriented to person, place, and time.  Skin: Skin is warm and dry. No rash noted. No erythema.  Psychiatric: She has a normal mood and affect. Her behavior is normal.    MAU Course  Procedures  De HollingsheadCatherine L Wallace 08/30/2014, 2:33 PM

## 2014-08-31 ENCOUNTER — Encounter (HOSPITAL_COMMUNITY): Payer: Self-pay | Admitting: Obstetrics & Gynecology

## 2014-08-31 DIAGNOSIS — O4690 Antepartum hemorrhage, unspecified, unspecified trimester: Secondary | ICD-10-CM

## 2014-08-31 DIAGNOSIS — O98119 Syphilis complicating pregnancy, unspecified trimester: Secondary | ICD-10-CM | POA: Diagnosis present

## 2014-08-31 LAB — RPR, QUANT+TP ABS (REFLEX)
Rapid Plasma Reagin, Quant: 1:32 {titer} — ABNORMAL HIGH
T Pallidum Abs: POSITIVE — AB

## 2014-08-31 LAB — CULTURE, BETA STREP (GROUP B ONLY)

## 2014-08-31 LAB — RPR: RPR Ser Ql: REACTIVE — AB

## 2014-08-31 MED ORDER — OXYCODONE-ACETAMINOPHEN 5-325 MG PO TABS
1.0000 | ORAL_TABLET | Freq: Once | ORAL | Status: AC
  Administered 2014-08-31: 1 via ORAL
  Filled 2014-08-31: qty 1

## 2014-08-31 MED ORDER — CYCLOBENZAPRINE HCL 5 MG PO TABS
5.0000 mg | ORAL_TABLET | Freq: Once | ORAL | Status: AC
  Administered 2014-08-31: 5 mg via ORAL
  Filled 2014-08-31: qty 1

## 2014-08-31 MED ORDER — PENICILLIN G BENZATHINE 1200000 UNIT/2ML IM SUSP
2.4000 10*6.[IU] | Freq: Once | INTRAMUSCULAR | Status: AC
  Administered 2014-08-31: 2.4 10*6.[IU] via INTRAMUSCULAR
  Filled 2014-08-31: qty 4

## 2014-08-31 NOTE — Progress Notes (Signed)
[facility] COMPREHENSIVE PROGRESS NOTE  Judy Wood is a 19 y.o. G2P1001 at 38w0dwho is admitted for vaginal bleeding, most likely due to placental abruption.  Estimated Date of Delivery: 11/02/14 Fetal presentation is cephalic.  Length of Stay:  1 Days. Admitted 08/30/2014  Subjective: Pt reports some cramping in hips, legs and across abdomen. Complains of diarrhea once during the night. Denies hematochezia.      Patient reports good fetal movement.  She reports no uterine contractions, no bleeding and no loss of fluid per vagina.  Vitals:  Blood pressure 121/66, pulse 64, temperature 98.5 F (36.9 C), temperature source Oral, resp. rate 18, height _0  (1.6 m), weight 261 lb (118.389 kg), last menstrual period 01/04/2014. Physical Examination: CONSTITUTIONAL: Well-developed, well-nourished female in no acute distress.  HENT:  Normocephalic, atraumatic, External right and left ear normal. Oropharynx is clear and moist EYES: Conjunctivae and EOM are normal. Pupils are equal, round, and reactive to light. No scleral icterus.  NECK: Normal range of motion, supple, no masses SKIN: Skin is warm and dry. No rash noted. Not diaphoretic. No erythema. No pallor. NEl [place] Alert and oriented to person, place, and time. Normal reflexes, muscle tone coordination. No cranial nerve deficit noted. PSYCHIATRIC: Normal mood and affect. Normal behavior. Normal judgment and thought content. CARDIOVASCULAR: Normal heart rate noted, regular rhythm RESPIRATORY: Effort and breath sounds normal, no problems with respiration noted MUSCULOSKELETAL: Normal range of motion. 1+ edema in bilat LE and no tenderness. 2+ distal pulses. ABDOMEN: Soft, nontender, nondistended, gravid.  Fetal monitoring: FHR: 130 bpm, Variability: moderate Uterine activity: 0 contractions per hour  Results for orders placed or performed during the hospital encounter of 08/30/14 (from the past 48 hour(s))  Urinalysis,  [facility]     Status: Abnormal   Collection Time: 08/30/14  2:08 PM  Result Value Ref Range   Color, Urine YELLOW YELLOW   APPearance CLEAR CLEAR   Specific Gravity, Urine 1.025 1.005 - 1.030   pH 6.0 5.0 - 8.0   Glucose, UA NEGATIVE NEGATIVE mg/dL   Hgb urine dipstick MODERATE (A) NEGATIVE   Bilirubin Urine NEGATIVE NEGATIVE   Ketones, ur 15 (A) NEGATIVE mg/dL   Protein, ur NEGATIVE NEGATIVE mg/dL   Urobilinogen, UA 1.0 0.0 - 1.0 mg/dL   Nitrite NEGATIVE NEGATIVE   Leukocytes, UA TRACE (A) NEGATIVE  Urine rapid drug screen (hosp performed)     Status: None   Collection Time: 08/30/14  2:08 PM  Result Value Ref Range   Opiates NONE DETECTED NONE DETECTED   Cocaine NONE DETECTED NONE DETECTED   Benzodiazepines NONE DETECTED NONE DETECTED   Amphetamines NONE DETECTED NONE DETECTED   Tetrahydrocannabinol NONE DETECTED NONE DETECTED   Barbiturates NONE DETECTED NONE DETECTED    Comment:        DRUG SCREEN FOR MEDICAL PURPOSES ONLY.  IF CONFIRMATION IS NEEDED FOR ANY PURPOSE, NOTIFY LAB WITHIN 5 DAYS.        LOWEST DETECTABLE LIMITS FOR URINE DRUG SCREEN Drug Class       Cutoff (ng/mL) Amphetamine      1000 Barbiturate      200 Benzodiazepine   2325Tricyclics       3498Opiates          300 Cocaine          300 THC              50   Urine microscopic-add on  Status: None   Collection Time: 08/30/14  2:08 PM  Result Value Ref Range   Squamous Epithelial / LPF RARE RARE   WBC, UA 0-2 <3 WBC/hpf   RBC / HPF 7-10 <3 RBC/hpf   Urine-Other MUCOUS PRESENT   CBC on admission     Status: Abnormal   Collection Time: 08/30/14  6:25 PM  Result Value Ref Range   WBC 8.3 4.0 - 10.5 K/uL   RBC 3.36 (L) 3.87 - 5.11 MIL/uL   Hemoglobin 9.8 (L) 12.0 - 15.0 g/dL   HCT 29.5 (L) 36.0 - 46.0 %   MCV 87.8 78.0 - 100.0 fL   MCH 29.2 26.0 - 34.0 pg   MCHC 33.2 30.0 - 36.0 g/dL   RDW 13.1 11.5 - 15.5 %   Platelets 269 150 - 400 K/uL  Comprehensive  metabolic panel     Status: Abnormal   Collection Time: 08/30/14  6:25 PM  Result Value Ref Range   Sodium 132 (L) 135 - 145 mmol/L   Potassium 3.3 (L) 3.5 - 5.1 mmol/L   Chloride 106 101 - 111 mmol/L   CO2 21 (L) 22 - 32 mmol/L   Glucose, Bld 107 (H) 65 - 99 mg/dL   BUN 5 (L) 6 - 20 mg/dL   Creatinine, Ser 0.43 (L) 0.44 - 1.00 mg/dL   Calcium 8.6 (L) 8.9 - 10.3 mg/dL   Total Protein 6.6 6.5 - 8.1 g/dL   Albumin 3.1 (L) 3.5 - 5.0 g/dL   AST 15 15 - 41 U/L   ALT 9 (L) 14 - 54 U/L   Alkaline Phosphatase 82 38 - 126 U/L   Total Bilirubin 0.4 0.3 - 1.2 mg/dL   GFR calc non Af Amer >60 >60 mL/min   GFR calc Af Amer >60 >60 mL/min    Comment: (NOTE) The eGFR has been calculated using the CKD EPI equation. This calculation has not been validated in all clinical situations. eGFR's persistently <60 mL/min signify possible Chronic Kidney Disease.    Anion gap 5 5 - 15  Type and screen     Status: None   Collection Time: 08/30/14  6:25 PM  Result Value Ref Range   ABO/RH(D) A POS    Antibody Screen NEG    Sample Expiration 09/02/2014     No results found.  Current scheduled medications . docusate sodium  100 mg Oral Daily  . prenatal multivitamin  1 tablet Oral Q1200  . sertraline  50 mg Oral QHS    I have reviewed the patient's current medications.  ASSESSMENT: Patient Active Problem List   Diagnosis Date Noted  . Vaginal bleeding 08/30/2014  . Premature rupture of membranes   . [redacted] weeks gestation of pregnancy   . Preterm premature rupture of membranes 08/06/2014  . Vaginal bleeding during pregnancy, antepartum   . Oligohydramnios in third trimester, antepartum 07/29/2014  . Obesity affecting pregnancy   . Prior poor obstetrical history in second trimester, antepartum   . Chronic hypertension complicating or reason for care during pregnancy 04/22/2014  . Previous cesarean delivery, antepartum 05/23/2012  . Hypertension 10/17/2011    PLAN: Awaiting GBS cx results Pt  refusing speculum/cervical exams U/S from 7/2 showed cervix was long and closed  Unable to obtain GC/chlamydia cx from pt    Continue routine antenatal care.   Melina Schools, MD OB Fellow Faculty Practice, [facility]

## 2014-08-31 NOTE — Progress Notes (Signed)
Faculty Practice OB/GYN Attending Note  18 y.o. G2P1001 at 4269w0d admitted for vaginal bleeding during pregnancy on 08/30/2014.  Admission labs remarkable for +RPR, with positive confirmatory T. Pallidum 1:32. Diagnosis of syphilis discussed with patient.  Ordered serum testing for HIV, Hep B and Hep C; GC/Chlam from admission still pending.   Of note, patient feels that she contracted this from her FOB who is currently incarcerated; but she has been sexually active with a female partner recently. Recommend that she needs to let partner(s) know so the partner(s) can get testing and treatment. Patient and sex partner(s) should abstain from unprotected sexual activity for seven days after everyone receives appropriate treatment.  Bicillin 2.4 million units IM was given to patient today.  Patient is aware that this is a reportable STI and was told to expect contact from the Health Department.   Prenatal transfer tool, problem list updated.  [facility].   Judy CollinsUGONNA  Ramaj Frangos, MD, FACOG Attending Obstetrician & Gynecologist Faculty Practice, [facility]

## 2014-08-31 NOTE — Progress Notes (Signed)
Pt states she is leaking small amount of tan colored fluid. Dr Truitt MerleNotified

## 2014-08-31 NOTE — Progress Notes (Signed)
Acknowledged order for Social Work consult.  Patient reported feeling depressed.  Spoke with patient at length.  She also has a 21 year old at home.   Mother has multiple stressors.  FOB is incarcerated and not expected to be released until end of next year.  When he was arrested, she was also charged with robbery because she was with him.  Although, she notes that she was not involved in the crime.  She is stressed with the demands of this charge.   She does not have stable housing.  She is currently living with her sister, but there really is not enough room, and plans to move in with a cousin.   Patient stares that she feels overwhelmed and just wants the pregnancy to be over with so that she could start addressing some of her other issues.  Patient states that this pregnancy is very different from her first pregnancy, as she has been experiencing a lot of pain and discomfort.   Allowed mother to talk about all the things that are stressing her out.  Spoke with her about how emotional pain can exacerbate physical pain.  She also spoke about being frustrated and having low tolerance.  CSW pointed out that this may be due to her not having control over many of the things that are going on with her.  Spoke with her at length about coping strategies and how attitude can help our situation or make it worse.    Patient was very receptive to the conversation.  She was tearful at times, and acknowledged how talking about her issues has been helpful.  Patient has been started on Zoloft.  Provided supportive feedback and encouragement throughout the conversation.

## 2014-09-01 DIAGNOSIS — O99820 Streptococcus B carrier state complicating pregnancy: Secondary | ICD-10-CM

## 2014-09-01 DIAGNOSIS — O98113 Syphilis complicating pregnancy, third trimester: Secondary | ICD-10-CM

## 2014-09-01 MED ORDER — PRENATAL MULTIVITAMIN CH
1.0000 | ORAL_TABLET | Freq: Every day | ORAL | Status: DC
  Administered 2014-09-02 – 2014-09-06 (×4): 1 via ORAL
  Filled 2014-09-01 (×4): qty 1

## 2014-09-01 MED ORDER — DOCUSATE SODIUM 100 MG PO CAPS
100.0000 mg | ORAL_CAPSULE | Freq: Every day | ORAL | Status: DC
  Administered 2014-09-03: 100 mg via ORAL
  Filled 2014-09-01 (×2): qty 1

## 2014-09-01 NOTE — Progress Notes (Signed)
0802-in room to see pt-pt sleeping soundly, did not awaken after called to several times. Will return to evalute pt shortly.

## 2014-09-01 NOTE — Progress Notes (Signed)
Sitting up eating well, no complaints voiced.

## 2014-09-01 NOTE — Progress Notes (Addendum)
[facility] COMPREHENSIVE PROGRESS NOTE  Judy Wood is a 19 y.o. G2P1001 at 30w1dwho is admitted for vaginal bleeding, most likely due to placental abruption. Last episode of bright red bleeding was on 08/30/14.  Estimated Date of Delivery: 11/02/14  Fetal presentation is cephalic on 75/1/88ultrasound.  Length of Stay:  2 Days. Admitted 08/30/2014  Subjective: Patient reports some diffuse cramping.  Complains of loose stools yesterday and this morning. Also reports small amount of vaginal bleeding. Denies hematochezia.    Of note, patient was treated for syphilis yesterday, please refer to note on 08/31/14. Patient reports good fetal movement.  She reports no uterine contractions and no loss of fluid per vagina   Vitals:  Blood pressure 117/51, pulse 65, temperature 98.2 F (36.8 C), temperature source Oral, resp. rate 20, height '5\' 3"'  (1.6 m), weight 261 lb (118.389 kg), last menstrual period 01/04/2014. Physical Examination: CONSTITUTIONAL: Well-developed, well-nourished female in no acute distress.  HENT:  Normocephalic, atraumatic, External right and left ear normal. Oropharynx is clear and moist EYES: Conjunctivae and EOM are normal. Pupils are equal, round, and reactive to light. No scleral icterus.  NECK: Normal range of motion, supple, no masses SKIN: Skin is warm and dry. No rash noted. Not diaphoretic. No erythema. No pallor. [place] Alert and oriented to person, place, and time. Normal reflexes, muscle tone coordination. No cranial nerve deficit noted. PSYCHIATRIC: Normal mood and affect. Normal behavior. Normal judgment and thought content. CARDIOVASCULAR: Normal heart rate noted, regular rhythm RESPIRATORY: Effort and breath sounds normal, no problems with respiration noted MUSCULOSKELETAL: Normal range of motion. 1+ edema in bilateral LE and no tenderness. 2+ distal pulses. ABDOMEN: Soft, nontender, nondistended, gravid.  Fetal monitoring: FHR: 130 bpm,  Variability: moderate Uterine activity: 0 contractions per hour  Results for orders placed or performed during the hospital encounter of 08/30/14 (from the past 48 hour(s))  Urinalysis, [facility] w reflex microscopic [facility]     Status: Abnormal   Collection Time: 08/30/14  2:08 PM  Result Value Ref Range   Color, Urine YELLOW YELLOW   APPearance CLEAR CLEAR   Specific Gravity, Urine 1.025 1.005 - 1.030   pH 6.0 5.0 - 8.0   Glucose, UA NEGATIVE NEGATIVE mg/dL   Hgb urine dipstick MODERATE (A) NEGATIVE   Bilirubin Urine NEGATIVE NEGATIVE   Ketones, ur 15 (A) NEGATIVE mg/dL   Protein, ur NEGATIVE NEGATIVE mg/dL   Urobilinogen, UA 1.0 0.0 - 1.0 mg/dL   Nitrite NEGATIVE NEGATIVE   Leukocytes, UA TRACE (A) NEGATIVE  Urine rapid drug screen (hosp performed)     Status: None   Collection Time: 08/30/14  2:08 PM  Result Value Ref Range   Opiates NONE DETECTED NONE DETECTED   Cocaine NONE DETECTED NONE DETECTED   Benzodiazepines NONE DETECTED NONE DETECTED   Amphetamines NONE DETECTED NONE DETECTED   Tetrahydrocannabinol NONE DETECTED NONE DETECTED   Barbiturates NONE DETECTED NONE DETECTED    Comment:        DRUG SCREEN FOR MEDICAL PURPOSES ONLY.  IF CONFIRMATION IS NEEDED FOR ANY PURPOSE, NOTIFY LAB WITHIN 5 DAYS.        LOWEST DETECTABLE LIMITS FOR URINE DRUG SCREEN Drug Class       Cutoff (ng/mL) Amphetamine      1000 Barbiturate      200 Benzodiazepine   2416Tricyclics       3606Opiates          300 Cocaine  300 THC              50   Urine microscopic-add on     Status: None   Collection Time: 08/30/14  2:08 PM  Result Value Ref Range   Squamous Epithelial / LPF RARE RARE   WBC, UA 0-2 <3 WBC/hpf   RBC / HPF 7-10 <3 RBC/hpf   Urine-Other MUCOUS PRESENT   Culture, beta strep (group b only)     Status: None   Collection Time: 08/30/14  6:08 PM  Result Value Ref Range   Specimen Description VAGINAL/RECTAL    Special Requests NONE    Culture      GROUP B  STREP(S.AGALACTIAE)ISOLATED Note: TESTING AGAINST S. AGALACTIAE NOT ROUTINELY PERFORMED DUE TO PREDICTABILITY OF AMP/PEN/VAN SUSCEPTIBILITY. Performed at [facility]    Report Status 08/31/2014 FINAL   CBC on admission     Status: Abnormal   Collection Time: 08/30/14  6:25 PM  Result Value Ref Range   WBC 8.3 4.0 - 10.5 K/uL   RBC 3.36 (L) 3.87 - 5.11 MIL/uL   Hemoglobin 9.8 (L) 12.0 - 15.0 g/dL   HCT 29.5 (L) 36.0 - 46.0 %   MCV 87.8 78.0 - 100.0 fL   MCH 29.2 26.0 - 34.0 pg   MCHC 33.2 30.0 - 36.0 g/dL   RDW 13.1 11.5 - 15.5 %   Platelets 269 150 - 400 K/uL  Comprehensive metabolic panel     Status: Abnormal   Collection Time: 08/30/14  6:25 PM  Result Value Ref Range   Sodium 132 (L) 135 - 145 mmol/L   Potassium 3.3 (L) 3.5 - 5.1 mmol/L   Chloride 106 101 - 111 mmol/L   CO2 21 (L) 22 - 32 mmol/L   Glucose, Bld 107 (H) 65 - 99 mg/dL   BUN 5 (L) 6 - 20 mg/dL   Creatinine, Ser 0.43 (L) 0.44 - 1.00 mg/dL   Calcium 8.6 (L) 8.9 - 10.3 mg/dL   Total Protein 6.6 6.5 - 8.1 g/dL   Albumin 3.1 (L) 3.5 - 5.0 g/dL   AST 15 15 - 41 U/L   ALT 9 (L) 14 - 54 U/L   Alkaline Phosphatase 82 38 - 126 U/L   Total Bilirubin 0.4 0.3 - 1.2 mg/dL   GFR calc non Af Amer >60 >60 mL/min   GFR calc Af Amer >60 >60 mL/min    Comment: (NOTE) The eGFR has been calculated using the CKD EPI equation. This calculation has not been validated in all clinical situations. eGFR's persistently <60 mL/min signify possible Chronic Kidney Disease.    Anion gap 5 5 - 15  Type and screen     Status: None   Collection Time: 08/30/14  6:25 PM  Result Value Ref Range   ABO/RH(D) A POS    Antibody Screen NEG    Sample Expiration 09/02/2014   RPR     Status: Abnormal   Collection Time: 08/30/14  6:25 PM  Result Value Ref Range   RPR Ser Ql Reactive (A) Non Reactive    Comment: (NOTE) Performed At: [facility] [address] [place], [place] [id number] Lindon Romp MD [id number]    RPR, quant & T.pallidum antibodies     Status: Abnormal   Collection Time: 08/30/14  6:25 PM  Result Value Ref Range   Rapid Plasma Reagin, Quant 1:32 (H) NonRea<1:1   T Pallidum Abs Positive (A) Negative    Comment: (NOTE) Performed At: [facility]  [place] [id number] Lindon Romp MD CZ:[id number]     No results found.  Current scheduled medications . docusate sodium  100 mg Oral Daily  . prenatal multivitamin  1 tablet Oral Q1200  . sertraline  50 mg Oral QHS    I have reviewed the patient's current medications.  ASSESSMENT: Patient Active Problem List   Diagnosis Date Noted  . Group B Streptococcus carrier, +RV culture, currently pregnant 09/01/2014  . Syphilis affecting pregnancy, antepartum 08/31/2014  . Vaginal bleeding during pregnancy, antepartum   . Obesity affecting pregnancy   . Chronic hypertension complicating or reason for care during pregnancy 04/22/2014  . Previous cesarean delivery, antepartum 05/23/2012  . Hypertension 10/17/2011    PLAN: Patient is s/p bicillin for syphilis; will be reported to Health Department. Follow up pending STI screen. No active bleeding noted; patient refused speculum/cervical exams.  U/S from 7/2 showed cervix was long and closed, no evidence of abruption on ultrasound If loose stools continue, may have to check C. Diff cultures given recent antibiotic exposure (had latency antibiotics prior to discharge on 08/15/14) Category I FHR tracing Continue routine inpatient antenatal care.   Osborne Oman, MD Faculty Practice, [facility]

## 2014-09-01 NOTE — Progress Notes (Signed)
Pt had 1 loose stool this shift. Collected and sent for testing.

## 2014-09-01 NOTE — Progress Notes (Signed)
[id number] c/o leg/hip pain rated an 8 that position change helps. Pt very sleepy and groggy. Had to awaken to evaluate her. Denies bleeding. Denies uc's. fhr evaluated-obix could not be unheld while holding cardio-pt's monitor froze. Rebooted computer. Pt  C/o diarrhea. Reminded that a hat was in toilet when she had BM. Reminded if she needed to help to call. Nurse call bell close by.

## 2014-09-01 NOTE — Progress Notes (Signed)
Stool rejected by lab;stool too formed to be sampled. If pt able to have a loose stool: RN may resend and reorder.

## 2014-09-02 ENCOUNTER — Ambulatory Visit (HOSPITAL_COMMUNITY): Payer: Medicaid Other

## 2014-09-02 ENCOUNTER — Encounter (HOSPITAL_COMMUNITY): Payer: Self-pay | Admitting: *Deleted

## 2014-09-02 LAB — HIV ANTIBODY (ROUTINE TESTING W REFLEX): HIV SCREEN 4TH GENERATION: NONREACTIVE

## 2014-09-02 LAB — HEPATITIS C ANTIBODY: HCV Ab: <0.1 s/co ratio (ref 0.0–0.9)

## 2014-09-02 LAB — HEPATITIS B SURFACE ANTIBODY, QUANTITATIVE

## 2014-09-02 MED ORDER — CYCLOBENZAPRINE HCL 5 MG PO TABS
5.0000 mg | ORAL_TABLET | Freq: Three times a day (TID) | ORAL | Status: DC | PRN
  Administered 2014-09-02: 5 mg via ORAL
  Filled 2014-09-02 (×2): qty 1

## 2014-09-02 NOTE — Progress Notes (Signed)
Patient states Fiorocet did not help just made her drowsy. Tossed and turned when sleeping.No bleeding noted. Dr Adrian BlackwaterStinson made aware. No new orders received. Will continue to monitor. Also states stools are formed with occasional looseness.

## 2014-09-02 NOTE — Progress Notes (Signed)
Patient ID: Judy Bowensierra D [place], female   DOB: 12/26/1995, 19 y.o.   MRN: [id number] FACULTY PRACTICE ANTEPARTUM(COMPREHENSIVE) NOTE  Judy BowensCierra D Wood is a 19 y.o. G2P1001 at 1773w2d  who is admitted for heavy vaginal bleeding from presumed abruption.   Length of Stay:  3  Days  Subjective: Patient reports the fetal movement as active. Patient reports uterine contraction  activity as none. Patient reports  vaginal bleeding as none. Patient describes fluid per vagina as None.  Vitals:  Blood pressure 125/81, pulse 77, temperature 98.3 F (36.8 C), temperature source Oral, resp. rate 18, height 5\' 3"  (1.6 m), weight 261 lb (118.389 kg), last menstrual period 01/04/2014. Physical Examination:  General appearance - alert, well appearing, and in no distress Abdomen - soft, gravid, non tender Extremities - no edema, redness or tenderness in the calves or thighs, Homan's sign negative bilaterally  Fetal Monitoring:  Baseline: 130 bpm, Variability: Good {> 6 bpm), Accelerations: Reactive and Decelerations: Absent  (the 2nd NST had a long period where mom was monitored with pulse in 90s)  Labs:  No results found for this or any previous visit (from the past 24 hour(s)).  Imaging Studies:    Followed by MFM  Medications:  Scheduled . docusate sodium  100 mg Oral Daily  . prenatal multivitamin  1 tablet Oral Q1200  . sertraline  50 mg Oral QHS   I have reviewed the patient's current medications.  ASSESSMENT: Patient Active Problem List   Diagnosis Date Noted  . Group B Streptococcus carrier, +RV culture, currently pregnant 09/01/2014  . Syphilis affecting pregnancy, antepartum 08/31/2014  . Vaginal bleeding during pregnancy, antepartum   . Obesity affecting pregnancy   . Chronic hypertension complicating or reason for care during pregnancy 04/22/2014  . Previous cesarean delivery, antepartum 05/23/2012  . Hypertension 10/17/2011    PLAN: -Plan for in house stay for 7 days after  bleed -Continue Syphilis treatment -BP well controlled -Continue Zoloft for depression.  [name] 09/02/2014,5:21 AM

## 2014-09-03 NOTE — Progress Notes (Signed)
Patient ID: Judy Wood, female   DOB: 12/22/1995, 19 y.o.   MRN: [id number] FACULTY PRACTICE ANTEPARTUM(COMPREHENSIVE) NOTE  Judy Wood is a 19 y.o. G2P1001 at 269w2d  who is admitted for heavy vaginal bleeding from presumed abruption.   Length of Stay:  4  Days  Subjective: No vaginal bleeding.  Yellowish/brownish discharge  Patient reports the fetal movement as active. Patient reports uterine contraction  activity as none. Patient reports  vaginal bleeding as none. Patient describes fluid per vagina as None.  Vitals:  Blood pressure 115/56, pulse 89, temperature 99 F (37.2 C), temperature source Oral, resp. rate 20, height 5\' 3"  (1.6 m), weight 261 lb (118.389 kg), last menstrual period 01/04/2014. Physical Examination:  General appearance - alert, well appearing, and in no distress Heart: regular rate, no murmur Lungs: clear to auscultation bilaterally, no wheezing. Abdomen - soft, gravid, non tender Extremities - no edema, redness or tenderness in the calves or thighs, Homan's sign negative bilaterally  Fetal Monitoring:  Baseline: 130 bpm, Variability: Good {> 6 bpm), Accelerations: Reactive and Decelerations: Absent   Labs:  No results found for this or any previous visit (from the past 24 hour(s)).  Imaging Studies:    Followed by MFM  Medications:  Scheduled . docusate sodium  100 mg Oral Daily  . prenatal multivitamin  1 tablet Oral Q1200  . sertraline  50 mg Oral QHS   I have reviewed the patient's current medications.  ASSESSMENT: Patient Active Problem List   Diagnosis Date Noted  . Chronic hypertension complicating or reason for care during pregnancy 04/22/2014    Priority: High  . Group B Streptococcus carrier, +RV culture, currently pregnant 09/01/2014  . Syphilis affecting pregnancy, antepartum 08/31/2014  . Vaginal bleeding during pregnancy, antepartum   . Obesity affecting pregnancy   . Previous cesarean delivery, antepartum 05/23/2012  .  Hypertension 10/17/2011    PLAN: 1. Vaginal bleeding - presumed abruption 2. Syphilis affecting pregnancy 3. CHTN  -Plan for in house stay for 7 days after bleed -s/p bicillin for Syphilis treatment -BP well controlled -Continue Zoloft for depression.  STINSON, JACOB JEHIEL 09/03/2014,6:59 AM

## 2014-09-03 NOTE — Clinical SW OB High Risk (Signed)
Clinical Social Work Antenatal   Clinical Social Worker:  Alphonzo Cruise, [place] Date/Time:  09/03/2014, 11:15 AM Gestational Age on Admission:  19 y.o. Admitting Diagnosis: Vaginal bleeding in pregnancy   Expected Delivery Date:  11/02/14  Family/Home Environment  Home Address: [address] [place], [place] [address]  Household Member/Support Name: Debbora Lacrosse  Relationship:  Mother Other Support: brother   Psychosocial Data  Information Source:  Patient Interview Resources:    Employment: N/A   Medicaid [facility]): [place]: N/A   Current Grade:   Homebound Arranged: No  Other Resources:  Personal assistant Care: None stated.   Strengths/Weaknesses/Factors to Consider  Concerns Related to Hospitalization: Patient states she is ready to be done with this pregnancy.  She reports being in pain with no relief from anything.   Previous Pregnancies/Feelings Towards Pregnancy?  Concerns related to being/becoming a mother?: Patient reports feeling, "sad" when she found out she was pregnant, and added that "the timing was wrong."  She reports having said things to her mother and FOB referring to not   Social Support (FOB? Who is/will be helping with baby/other kids?): MGM is currently caring for patient's other child.    Couples Relationship (describe): Patient states she and FOB are in a relationship, but he is currently in jail.  She reports that he has been in jail since December 2015 and may be released at the end of this year or sometime next year.   Recent Stressful Life Events (life changes in past year?): Patient eludes to many stressors recently.  She spoke about a difficult living situation with her sister resulting in her mother moving her stuff out of that home yesterday.  FOB's incarceration is very troubling to patient.  She feels abandoned by FOB.  She also is involved with the court system.  She expresses  feelings of "frustration" and "aggravation" repeatedly.     Prenatal Care/Education/Home Preparations: Not discussed at this time.   Domestic Violence (of any type):  No If Yes to Domestic Violence, Describe/Action Plan:     Substance Use During Pregnancy: No (If Yes, Complete SBIRT)  Complete PHQ-9 (Depresssion Screening) Score: 17   Follow-up Recommendations: CSW recommends continuing Zoloft and considering increasing dose.  CSW recommends meeting with CSW to further process feelings and problem solve while inpatient.  CSW recommends outpatient counseling follow up and will provide resources prior to patient's discharge.  CSW contracted for safety with patient if she becomes suicidal at any time and provided [facility] Mobile Crisis number.   Patient Advised/Response: Patient was somewhat guarded when CSW initially arrived, but opened up tremendously throughout the two hour visit with CSW.  Patient states she feels better after talking with CSW today.  Patient is proud of herself for taking the step to start an antidepressant.  She now understands that it may take 4-6 weeks before she feels the benefits.  She commits to giving it time to take effect.  Patient is also open to outpatient counseling upon discharge.     Other: CSW provided patient with a journal and encouraged her to try journaling as a therapeutic technique and coping mechanism.  Patient states she used to write poetry.   Clinical Assessment/Plan: CSW met with patient to offer supportive counseling.  Patient was pleasant and receptive to CSW intervention and eventually very open to discussing her stressors and current emotional state.  Patient presents as depressed.  She reports struggling with depression for a long time  and that her mother also suffers from depression.  Patient talked at length about the multiple traumas she has experienced in her life.  CSW provided strength based, solution focused brief  counseling to assist patient in processing her experiences and feelings.  Patient continually said comments such as "it's a lot," "it's hard," "I'm aggravated," and "I'm frustrated."  CSW helped patient process these feelings and encouraged journaling as a way of expressing her feelings.  CSW encourages ongoing processing to learn how to cope with the multiple traumas she has experienced in her life.  Patient is open to continued sessions with CSW as well as outpatient counseling.  CSW commended patient and thanked her for sharing difficult hx and emotions with CSW today.  CSW available as patient desires to talk.

## 2014-09-03 NOTE — Progress Notes (Signed)
CSW provided supportive counseling to patient for approximately 2 hours today.  Full documentation to follow.

## 2014-09-04 ENCOUNTER — Encounter: Payer: Medicaid Other | Admitting: Family Medicine

## 2014-09-04 ENCOUNTER — Ambulatory Visit (HOSPITAL_COMMUNITY)
Admission: RE | Admit: 2014-09-04 | Discharge: 2014-09-04 | Disposition: A | Discharge status: Home / Self Care | Payer: Medicaid Other | Source: Ambulatory Visit | Attending: Family Medicine | Admitting: Family Medicine

## 2014-09-04 DIAGNOSIS — IMO0002 Reserved for concepts with insufficient information to code with codable children: Secondary | ICD-10-CM

## 2014-09-04 DIAGNOSIS — O4690 Antepartum hemorrhage, unspecified, unspecified trimester: Secondary | ICD-10-CM | POA: Insufficient documentation

## 2014-09-04 DIAGNOSIS — O4593 Premature separation of placenta, unspecified, third trimester: Secondary | ICD-10-CM | POA: Insufficient documentation

## 2014-09-04 DIAGNOSIS — O10913 Unspecified pre-existing hypertension complicating pregnancy, third trimester: Secondary | ICD-10-CM | POA: Insufficient documentation

## 2014-09-04 DIAGNOSIS — O3421 Maternal care for scar from previous cesarean delivery: Secondary | ICD-10-CM | POA: Insufficient documentation

## 2014-09-04 DIAGNOSIS — Z3A31 31 weeks gestation of pregnancy: Secondary | ICD-10-CM | POA: Insufficient documentation

## 2014-09-04 DIAGNOSIS — O365930 Maternal care for other known or suspected poor fetal growth, third trimester, not applicable or unspecified: Secondary | ICD-10-CM | POA: Insufficient documentation

## 2014-09-04 NOTE — Progress Notes (Signed)
Patient ID: Judy Bowensierra D Luviano, female   DOB: 11/01/1995, 19 y.o.   MRN: [id number] FACULTY PRACTICE ANTEPARTUM(COMPREHENSIVE) NOTE  Judy Wood is a 19 y.o. G2P1001 at 5431w4d by best clinical estimate who is admitted for vaginal bleeding.   Fetal presentation is cephalic. Length of Stay:  5  Days  Subjective: Doing well. She is not having active bleeding, just spotting with contractions. Patient reports the fetal movement as active. Patient reports uterine contraction  activity as irregular, every 10-60 minutes. Patient reports  vaginal bleeding as scant staining. Patient describes fluid per vagina as None.  Vitals:  Blood pressure 110/55, pulse 92, temperature 98.9 F (37.2 C), temperature source Oral, resp. rate 20, height 5\' 3"  (1.6 m), weight 260 lb 0.1 oz (117.937 kg), last menstrual period 01/04/2014, SpO2 99 %. Physical Examination:  General appearance - alert, well appearing, and in no distress Abdomen - gravid, NT Fundal Height:  size equals dates Extremities: extremities normal, atraumatic, no cyanosis or edema  Membranes:intact  Fetal Monitoring:  Baseline: 140 bpm, Variability: Good {> 6 bpm), Accelerations: Reactive and Decelerations: Absent  U/s today shows vtx, AFI 13, EFW 3 lb 6 oz, HC is < 3 % for f/u in 3 wks.  Medications:  Scheduled . docusate sodium  100 mg Oral Daily  . prenatal multivitamin  1 tablet Oral Q1200  . sertraline  50 mg Oral QHS   I have reviewed the patient's current medications.  ASSESSMENT: Principal Problem:   Vaginal bleeding during pregnancy, antepartum Active Problems:   Previous cesarean delivery, antepartum   Chronic hypertension complicating or reason for care during pregnancy   Syphilis affecting pregnancy, antepartum   Group B Streptococcus carrier, +RV culture, currently pregnant   Poor fetal growth affecting management of mother in third trimester, antepartum   PLAN: Continue inpatient management until 7 days post bleed. S/p  PCN for treatment of syphilis Monitor for s/sx's of worsening maternal/fetal status   Nissan Frazzini S, MD 09/04/2014,2:52 PM

## 2014-09-05 NOTE — Progress Notes (Signed)
Patient ID: Judy Wood, female   DOB: 06/15/1995, 19 y.o.   MRN: [id number] FACULTY PRACTICE ANTEPARTUM(COMPREHENSIVE) NOTE  Judy Wood is a 19 y.o. G2P1001 at 7833w5d by best clinical estimate who is admitted for bleeding in pregnancy.   Fetal presentation is cephalic. Length of Stay:  6  Days  Subjective: Feels well. No longer having any bleeding. Patient reports the fetal movement as active. Patient reports uterine contraction  activity as none. Patient reports  vaginal bleeding as none. Patient describes fluid per vagina as None.  Vitals:  Blood pressure 132/69, pulse 73, temperature 98.2 F (36.8 C), temperature source Oral, resp. rate 20, height 5\' 3"  (1.6 m), weight 258 lb 8 oz (117.255 kg), last menstrual period 01/04/2014, SpO2 99 %. Physical Examination:  General appearance - alert, well appearing, and in no distress Abdomen - gravid, NT Fundal Height:  size equals dates Extremities: extremities normal, atraumatic, no cyanosis or edema  Membranes:intact  Fetal Monitoring:  Baseline: 150 bpm, Variability: Good {> 6 bpm), Accelerations: Reactive and Decelerations: Absent   Medications:  Scheduled . docusate sodium  100 mg Oral Daily  . prenatal multivitamin  1 tablet Oral Q1200  . sertraline  50 mg Oral QHS   I have reviewed the patient's current medications.  ASSESSMENT: Patient Active Problem List   Diagnosis Date Noted  . Poor fetal growth affecting management of mother in third trimester, antepartum   . Group B Streptococcus carrier, +RV culture, currently pregnant 09/01/2014  . Syphilis affecting pregnancy, antepartum 08/31/2014  . Vaginal bleeding during pregnancy, antepartum   . Obesity affecting pregnancy   . Chronic hypertension complicating or reason for care during pregnancy 04/22/2014  . Previous cesarean delivery, antepartum 05/23/2012  . Hypertension 10/17/2011    PLAN: Continue inpatient monitoring until 1 wk from last bleed. Hopefully home  tomorrow.  Reva BoresPRATT,TANYA S, MD 09/05/2014,8:05 AM

## 2014-09-05 NOTE — Progress Notes (Signed)
CSW met with patient in her third floor/Antenatal room to check on her and offer continued support.  Patient appears to be in good spirits today and states she has been able to get some rest over the last couple days (a concern when CSW initially met with patient).  Patient states she will hopefully get discharged tomorrow.  CSW discussed recommendations for outpatient counseling follow up as we discussed in initial meeting.  Patient states she is very interested and would like CSW to make a referral for her.  She states she has spoken with her mother about starting counseling and her mother thinks it will help her.  Patient states her mother has suffered from Depression and Schizophrenia and is in counseling herself.  Patient states she does not know where her mother goes for treatment, and that she is not interested in going to the same agency.  Patient states she wants an agency in [place].  CSW discussed plan to make referral to [facility], an agency accepting Medicaid and uninsured patients.  Patient states agreement.  CSW submitted referral by fax and provided patient with the contact information.  Patient is in a very pleasant mood and states appreciation for support offered by CSW.  CSW informed patient that she can call CSW in the future if the referral does not work out.

## 2014-09-06 MED ORDER — SERTRALINE HCL 50 MG PO TABS: 50.0000 mg | ORAL_TABLET | Freq: Every day | ORAL | Status: DC

## 2014-09-06 NOTE — Discharge Summary (Signed)
Physician Discharge Summary  Patient ID: Judy Wood MRN: [id number] DOB/AGE: 21/05/1995 20 y.o.  Admit date: 08/30/2014 Discharge date: 7/9/201[name]6  Admission Diagnoses: 34 weeks with third trimester bleeding  Discharge Diagnoses:  Principal Problem:   Vaginal bleeding during pregnancy, antepartum Active Problems:   Previous cesarean delivery, antepartum   Chronic hypertension complicating or reason for care during pregnancy   Syphilis affecting pregnancy, antepartum   Group B Streptococcus carrier, +RV culture, currently pregnant   Poor fetal growth affecting management of mother in third trimester, antepartum   Discharged Condition: stable  Hospital Course: admitted with vaginal bleeding which resolved at time of admission, none since then +RPR treated Reassuring fetal status throughout BP well controlled on 1/2 dose labetalol  Consults:   Significant Diagnostic Studies: sonogram  Treatments: antibiotics: penicillin and observation  Discharge Exam: Blood pressure 114/69, pulse 83, temperature 98 F (36.7 C), temperature source Oral, resp. rate 18, height 5\' 3"  (1.6 m), weight 259 lb (117.482 kg), last menstrual period 01/04/2014, SpO2 100 %. General appearance: alert, cooperative and no distress  Abdomen soft non tender Ext no edema no s/s dvt  Disposition: 01-Home or Self Care  Discharge Instructions    Call MD for:    Complete by:  As directed   Vaginal bleeding, regular contractions     Diet - low sodium heart healthy    Complete by:  As directed      Increase activity slowly    Complete by:  As directed      Lifting restrictions    Complete by:  As directed   Nothing more than 10 pounds     Sexual Activity Restrictions    Complete by:  As directed   No sex            Medication List    TAKE these medications        acetaminophen 325 MG tablet  Commonly known as:  TYLENOL  Take 650 mg by mouth every 6 (six) hours as needed (pain).     aspirin EC  81 MG tablet  Take 1 tablet (81 mg total) by mouth daily.     labetalol 100 MG tablet  Commonly known as:  NORMODYNE  Take 0.5 tablets (50 mg total) by mouth 2 (two) times daily.     NIFEdipine 10 MG capsule  Commonly known as:  PROCARDIA  Take 1 tablet with contractions. If contractions persist, can take additional 10 mg 10 minutes later. Do not exceed 40 mg in 1 hour. Can repeat dosage in 3 hours.     prenatal vitamin w/FE, FA 27-1 MG Tabs tablet  Take 1 tablet by mouth daily at 12 noon.           Follow-up Information    [facility].   Why:  ob visit   Contact information:   [address] [place] [id number] [phone]      Signed: Lazaro ArmsURE,Jaden Batchelder H 09/06/2014, 7:17 AM

## 2014-09-06 NOTE — Progress Notes (Signed)
Discharge instructions reviewed with patient.  Patient states understanding of home care, medications, activity, signs/symptoms to report to MD and return MD office visit.  Patients significant other and family will assist with her care @ home.  No home  equipment needed, patient has prescriptions and all personal belongings.  Patient dischargd in  stable condition with staff without incident.

## 2014-09-06 NOTE — Progress Notes (Signed)
Patient ID: Judy Wood, female   DOB: 05/23/1995, 19 y.o.   MRN: [id number] Patient ID: Judy Wood, female   DOB: 12/26/1995, 19 y.o.   MRN: [id number] FACULTY PRACTICE ANTEPARTUM(COMPREHENSIVE) NOTE  Judy Wood is a 19 y.o. G2P1001 at 31w6  by best clinical estimate who is admitted for bleeding in pregnancy, presumed marginal sinus abruption   Fetal presentation is cephalic. Length of Stay:  7  Days  Subjective: Feels well. No longer having any bleeding, none since 08/30/2014 Patient reports the fetal movement as active. Patient reports uterine contraction  activity as none. Patient reports  vaginal bleeding as none. Patient describes fluid per vagina as None.  Vitals:  Blood pressure 114/69, pulse 83, temperature 98 F (36.7 C), temperature source Oral, resp. rate 18, height 5\' 3"  (1.6 m), weight 259 lb (117.482 kg), last menstrual period 01/04/2014, SpO2 100 %. Physical Examination:  General appearance - alert, well appearing, and in no distress Abdomen - gravid, NT Fundal Height:  size equals dates Extremities: extremities normal, atraumatic, no cyanosis or edema  Membranes:intact  Fetal Monitoring:  Baseline: 150 bpm, Variability: Good {> 6 bpm), Accelerations: Reactive and Decelerations: Absent   Medications:  Scheduled . docusate sodium  100 mg Oral Daily  . prenatal multivitamin  1 tablet Oral Q1200  . sertraline  50 mg Oral QHS   I have reviewed the patient's current medications.  ASSESSMENT: 6072w6d Estimated Date of Delivery: 11/02/14 with third trimester bleeding, presumed marginal sinus abruption   Patient Active Problem List   Diagnosis Date Noted  . Poor fetal growth affecting management of mother in third trimester, antepartum   . Group B Streptococcus carrier, +RV culture, currently pregnant 09/01/2014  . Syphilis affecting pregnancy, antepartum 08/31/2014  . Vaginal bleeding during pregnancy, antepartum   . Obesity affecting pregnancy   . Chronic  hypertension complicating or reason for care during pregnancy 04/22/2014  . Previous cesarean delivery, antepartum 05/23/2012  . Hypertension 10/17/2011    PLAN: Continue inpatient monitoring until 1 wk from last bleed. Home today  Lazaro ArmsEURE,LUTHER H, MD 09/06/2014,7:11 AM

## 2014-09-06 NOTE — Discharge Instructions (Signed)
Placental Abruption °Your placenta is the organ that nourishes your unborn baby (fetus). Your baby gets his or her blood supply and nutrients through your placenta. It is your baby's life support system. It is attached to the inside of your uterus until after your baby is born.  °Placental abruption is when the placenta partly or completely separates from the uterus before your baby is born. This is rare, but it can happen any time after 20 weeks of pregnancy. A small separation may not cause problems, but a large separation may be dangerous for you and your baby. °CAUSES  °Most of the time the cause of a placental abruption is unknown. Though it is rare, a placental abruption can be caused by:  °· An abdominal injury.   °· The baby turning from a buttocks-first position (breech presentation) or a sideways position (transverse) to a headfirst position (cephalic).   °· Delivering the first of multiple babies (twins, triplets, or more).   °· Sudden loss of amniotic fluid (premature rupture of the membranes).   °· An abnormally short umbilical cord. °RISK FACTORS °Some risk factors make a placental abruption more likely, including: °· History of placental abruption. °· High blood pressure (hypertension). °· Smoking. °· Alcohol intake. °· Blood clotting problems. °· Too much amniotic fluid. °· Having had multiples (twins or triplets or more). °· Seizures and convulsions. °· Diabetes mellitus. °· Having had more than four children. °· Age 35 years or older. °· Illegal drug use. °· Injury to your abdomen. °SIGNS AND SYMPTOMS  °A small placental abruption may not cause symptoms. If you do have symptoms, they may include: °· Mild abdominal pain. °· Slight vaginal bleeding. °Symptoms of severe placental abruption depend on the size of the separation and the stage of pregnancy. Symptoms may include:  °· Sudden pain in your uterus. °· Abdominal pain. °· Vaginal bleeding. °· Tender uterus. °· Severe abdominal pain with  tenderness. °· Continual contractions of your uterus. °· Back pain. °· Weakness, light-headedness. °DIAGNOSIS  °Placental abruption is suspected when a pregnant woman develops sudden pain in her uterus. The health care provider will check whether the uterus is very tender, hard, and enlarging and whether the baby has an abnormal heart rate or rhythm. Ultrasonography (commonly called an ultrasound) will be done. Blood work will also be done to make sure that there are enough healthy red blood cells and that there are no clotting problems or signs of too much blood loss. °TREATMENT  °Placental abruption is usually an emergency. It requires treatment right away. Your treatment will depend on:  °· The amount of bleeding. °· Whether you or you baby are in distress. °· The stage of your pregnancy. °· The maturity of the baby. °Treatment for partial separation of the placenta is bed rest and close observation. You also may need a blood transfusion or to receive fluids through an IV tube. Treatment for complete placental separation is delivery of your baby. You may have a cesarean delivery if your baby is in distress. °HOME CARE INSTRUCTIONS  °· Only take medicines as directed by your health care provider. °· Arrange for help at home before and after you deliver the baby, especially if you had a cesarean delivery or lost a lot of blood. °· Get plenty of rest and sleep. °· Do not have sexual intercourse until your health care provider says it is okay. °· Do not use tampons or douche unless your health care provider says it is okay. °SEEK MEDICAL CARE IF: °· You   have light vaginal bleeding or spotting. °· You have any type of trauma, such as a fall or jolt during an accident. °· You are having trouble avoiding drugs, alcohol, or smoking. °SEEK IMMEDIATE MEDICAL CARE IF: °· You have vaginal bleeding. °· You have abdominal pain. °· You have continuous uterine contractions. °· You have a hard, tender uterus. °· You do not feel  the baby move, or the baby moves very little. °MAKE SURE YOU: °· Understand these instructions. °· Will watch your condition. °· Will get help right away if you are not doing well or get worse. °Document Released: 02/14/2005 Document Revised: 02/19/2013 Document Reviewed: 12/07/2012 °ExitCare® Patient Information ©2015 [employer]. This information is not intended to replace advice given to you by your health care provider. Make sure you discuss any questions you have with your health care provider. ° °

## 2014-09-09 ENCOUNTER — Ambulatory Visit (HOSPITAL_COMMUNITY): Payer: Medicaid Other

## 2014-09-11 NOTE — Progress Notes (Signed)
[facility] [place] [facility] stating that they have a 2 month waiting list for patients with Medicaid.  CSW attempted to call patient to inform and offer referral to a different agency, but there was no answer.  CSW left brief message requesting a call back, but did not state reason for the call.

## 2014-09-15 ENCOUNTER — Ambulatory Visit (HOSPITAL_COMMUNITY)
Admission: RE | Admit: 2014-09-15 | Discharge: 2014-09-15 | Disposition: A | Discharge status: Home / Self Care | Payer: Medicaid Other | Source: Ambulatory Visit | Attending: Obstetrics & Gynecology | Admitting: Obstetrics & Gynecology

## 2014-09-15 ENCOUNTER — Ambulatory Visit (INDEPENDENT_AMBULATORY_CARE_PROVIDER_SITE_OTHER): Payer: Medicaid Other | Admitting: Obstetrics & Gynecology

## 2014-09-15 VITALS — BP 122/75 | HR 94 | Temp 98.4°F | Wt 267.8 lb

## 2014-09-15 DIAGNOSIS — Z3A33 33 weeks gestation of pregnancy: Secondary | ICD-10-CM | POA: Insufficient documentation

## 2014-09-15 DIAGNOSIS — O4593 Premature separation of placenta, unspecified, third trimester: Secondary | ICD-10-CM | POA: Insufficient documentation

## 2014-09-15 DIAGNOSIS — O10913 Unspecified pre-existing hypertension complicating pregnancy, third trimester: Secondary | ICD-10-CM | POA: Insufficient documentation

## 2014-09-15 DIAGNOSIS — I10 Essential (primary) hypertension: Secondary | ICD-10-CM | POA: Insufficient documentation

## 2014-09-15 DIAGNOSIS — O4703 False labor before 37 completed weeks of gestation, third trimester: Secondary | ICD-10-CM

## 2014-09-15 DIAGNOSIS — Z23 Encounter for immunization: Secondary | ICD-10-CM | POA: Diagnosis not present

## 2014-09-15 LAB — POCT URINALYSIS DIP (DEVICE)
Bilirubin Urine: NEGATIVE
Glucose, UA: NEGATIVE mg/dL
Hgb urine dipstick: NEGATIVE
KETONES UR: NEGATIVE mg/dL
Nitrite: NEGATIVE
PROTEIN: NEGATIVE mg/dL
Specific Gravity, Urine: 1.02 (ref 1.005–1.030)
Urobilinogen, UA: 1 mg/dL (ref 0.0–1.0)
pH: 8 (ref 5.0–8.0)

## 2014-09-15 MED ORDER — NIFEDIPINE 10 MG PO CAPS: ORAL_CAPSULE | ORAL | Status: DC

## 2014-09-15 MED ORDER — TETANUS-DIPHTH-ACELL PERTUSSIS 5-2.5-18.5 LF-MCG/0.5 IM SUSP
0.5000 mL | Freq: Once | INTRAMUSCULAR | Status: AC
  Administered 2014-09-15: 0.5 mL via INTRAMUSCULAR

## 2014-09-15 NOTE — Progress Notes (Signed)
Subjective:  Judy Wood is a 19 y.o. G2P1001 at 2735w1d being seen today for ongoing prenatal care.  Patient reports no complaints.  Contractions: Not present.  Vag. Bleeding: None. Movement: Present. Denies leaking of fluid.   The following portions of the patient's history were reviewed and updated as appropriate: allergies, current medications, past family history, past medical history, past social history, past surgical history and problem list.   Objective:   Filed Vitals:   09/15/14 1129  BP: 122/75  Pulse: 94  Temp: 98.4 F (36.9 C)  Weight: 267 lb 12.8 oz (121.473 kg)    Fetal Status: Fetal Heart Rate (bpm): 140 Fundal Height: 35 cm Movement: Present     General:  Alert, oriented and cooperative. Patient is in no acute distress.  Skin: Skin is warm and dry. No rash noted.   Cardiovascular: Normal heart rate noted  Respiratory: Normal respiratory effort, no problems with respiration noted  Abdomen: Soft, gravid, appropriate for gestational age. Pain/Pressure: Present     Vaginal: Vag. Bleeding: None.       Cervix: Not evaluated        Extremities: Normal range of motion.  Edema: Trace  Mental Status: Normal mood and affect. Normal behavior. Normal judgment and thought content.   Urinalysis: Urine Protein: Negative Urine Glucose: Negative  Assessment and Plan:  Pregnancy: G2P1001 at 10735w1d  1. Preterm contractions, third trimester  Rare contraction, not taking procardia.  Will d/c this medication  2. Chronic hypertension complicating or reason for care during pregnancy, third trimester BP stable on Labetalol 50 mg bid.  - Tdap (BOOSTRIX) injection 0.5 mL; Inject 0.5 mLs into the muscle once. - US Fetal BPP W/O Non Stress; Future - US OB Follow Up; Future  3. Placental abruption in third trimester 2x weekly testing. - Biophysical profile - US Fetal BPP W/O Non Stress; Future - US OB Follow Up; Future  Preterm labor symptoms and general obstetric precautions  including but not limited to vaginal bleeding, contractions, leaking of fluid and fetal movement were reviewed in detail with the patient. Please refer to After Visit Summary for other counseling recommendations.  No Follow-up on file.   Lesly DukesKelly H Alban Marucci, MD

## 2014-09-15 NOTE — Progress Notes (Signed)
Pt is not available for second fetal testing appt this week. She will have BPP today.

## 2014-09-15 NOTE — Progress Notes (Signed)
Reviewed tip of week with patient  

## 2014-09-22 ENCOUNTER — Ambulatory Visit (INDEPENDENT_AMBULATORY_CARE_PROVIDER_SITE_OTHER): Payer: Medicaid Other | Admitting: Obstetrics and Gynecology

## 2014-09-22 VITALS — BP 137/74 | HR 99 | Temp 98.0°F | Wt 267.3 lb

## 2014-09-22 DIAGNOSIS — O4593 Premature separation of placenta, unspecified, third trimester: Secondary | ICD-10-CM | POA: Diagnosis not present

## 2014-09-22 DIAGNOSIS — O99213 Obesity complicating pregnancy, third trimester: Secondary | ICD-10-CM

## 2014-09-22 DIAGNOSIS — O365932 Maternal care for other known or suspected poor fetal growth, third trimester, fetus 2: Secondary | ICD-10-CM

## 2014-09-22 DIAGNOSIS — E669 Obesity, unspecified: Secondary | ICD-10-CM

## 2014-09-22 DIAGNOSIS — O10913 Unspecified pre-existing hypertension complicating pregnancy, third trimester: Secondary | ICD-10-CM | POA: Diagnosis not present

## 2014-09-22 DIAGNOSIS — I10 Essential (primary) hypertension: Secondary | ICD-10-CM | POA: Diagnosis not present

## 2014-09-22 LAB — POCT URINALYSIS DIP (DEVICE)
Bilirubin Urine: NEGATIVE
Glucose, UA: NEGATIVE mg/dL
Hgb urine dipstick: NEGATIVE
KETONES UR: NEGATIVE mg/dL
Leukocytes, UA: NEGATIVE
Nitrite: NEGATIVE
PROTEIN: 30 mg/dL — AB
Urobilinogen, UA: 1 mg/dL (ref 0.0–1.0)
pH: 6.5 (ref 5.0–8.0)

## 2014-09-22 NOTE — Addendum Note (Signed)
Addended by: Jill Side on: 09/22/2014 05:18 PM   Modules accepted: Orders

## 2014-09-22 NOTE — Patient Instructions (Signed)
Third Trimester of Pregnancy The third trimester is from week 29 through week 42, months 7 through 9. The third trimester is a time when the fetus is growing rapidly. At the end of the ninth month, the fetus is about 20 inches in length and weighs 6-10 pounds.  BODY CHANGES Your body goes through many changes during pregnancy. The changes vary from woman to woman.   Your weight will continue to increase. You can expect to gain 25-35 pounds (11-16 kg) by the end of the pregnancy.  You may begin to get stretch marks on your hips, abdomen, and breasts.  You may urinate more often because the fetus is moving lower into your pelvis and pressing on your bladder.  You may develop or continue to have heartburn as a result of your pregnancy.  You may develop constipation because certain hormones are causing the muscles that push waste through your intestines to slow down.  You may develop hemorrhoids or swollen, bulging veins (varicose veins).  You may have pelvic pain because of the weight gain and pregnancy hormones relaxing your joints between the bones in your pelvis. Backaches may result from overexertion of the muscles supporting your posture.  You may have changes in your hair. These can include thickening of your hair, rapid growth, and changes in texture. Some women also have hair loss during or after pregnancy, or hair that feels dry or thin. Your hair will most likely return to normal after your baby is born.  Your breasts will continue to grow and be tender. A yellow discharge may leak from your breasts called colostrum.  Your belly button may stick out.  You may feel short of breath because of your expanding uterus.  You may notice the fetus "dropping," or moving lower in your abdomen.  You may have a bloody mucus discharge. This usually occurs a few days to a week before labor begins.  Your cervix becomes thin and soft (effaced) near your due date. WHAT TO EXPECT AT YOUR PRENATAL  EXAMS  You will have prenatal exams every 2 weeks until week 36. Then, you will have weekly prenatal exams. During a routine prenatal visit:  You will be weighed to make sure you and the fetus are growing normally.  Your blood pressure is taken.  Your abdomen will be measured to track your baby's growth.  The fetal heartbeat will be listened to.  Any test results from the previous visit will be discussed.  You may have a cervical check near your due date to see if you have effaced. At around 36 weeks, your caregiver will check your cervix. At the same time, your caregiver will also perform a test on the secretions of the vaginal tissue. This test is to determine if a type of bacteria, Group B streptococcus, is present. Your caregiver will explain this further. Your caregiver may ask you:  What your birth plan is.  How you are feeling.  If you are feeling the baby move.  If you have had any abnormal symptoms, such as leaking fluid, bleeding, severe headaches, or abdominal cramping.  If you have any questions. Other tests or screenings that may be performed during your third trimester include:  Blood tests that check for low iron levels (anemia).  Fetal testing to check the health, activity level, and growth of the fetus. Testing is done if you have certain medical conditions or if there are problems during the pregnancy. FALSE LABOR You may feel small, irregular contractions that   eventually go away. These are called Braxton Hicks contractions, or false labor. Contractions may last for hours, days, or even weeks before true labor sets in. If contractions come at regular intervals, intensify, or become painful, it is best to be seen by your caregiver.  SIGNS OF LABOR   Menstrual-like cramps.  Contractions that are 5 minutes apart or less.  Contractions that start on the top of the uterus and spread down to the lower abdomen and back.  A sense of increased pelvic pressure or back  pain.  A watery or bloody mucus discharge that comes from the vagina. If you have any of these signs before the 37th week of pregnancy, call your caregiver right away. You need to go to the hospital to get checked immediately. HOME CARE INSTRUCTIONS   Avoid all smoking, herbs, alcohol, and unprescribed drugs. These chemicals affect the formation and growth of the baby.  Follow your caregiver's instructions regarding medicine use. There are medicines that are either safe or unsafe to take during pregnancy.  Exercise only as directed by your caregiver. Experiencing uterine cramps is a good sign to stop exercising.  Continue to eat regular, healthy meals.  Wear a good support bra for breast tenderness.  Do not use hot tubs, steam rooms, or saunas.  Wear your seat belt at all times when driving.  Avoid raw meat, uncooked cheese, cat litter boxes, and soil used by cats. These carry germs that can cause birth defects in the baby.  Take your prenatal vitamins.  Try taking a stool softener (if your caregiver approves) if you develop constipation. Eat more high-fiber foods, such as fresh vegetables or fruit and whole grains. Drink plenty of fluids to keep your urine clear or pale yellow.  Take warm sitz baths to soothe any pain or discomfort caused by hemorrhoids. Use hemorrhoid cream if your caregiver approves.  If you develop varicose veins, wear support hose. Elevate your feet for 15 minutes, 3-4 times a day. Limit salt in your diet.  Avoid heavy lifting, wear low heal shoes, and practice good posture.  Rest a lot with your legs elevated if you have leg cramps or low back pain.  Visit your dentist if you have not gone during your pregnancy. Use a soft toothbrush to brush your teeth and be gentle when you floss.  A sexual relationship may be continued unless your caregiver directs you otherwise.  Do not travel far distances unless it is absolutely necessary and only with the approval  of your caregiver.  Take prenatal classes to understand, practice, and ask questions about the labor and delivery.  Make a trial run to the hospital.  Pack your hospital bag.  Prepare the baby's nursery.  Continue to go to all your prenatal visits as directed by your caregiver. SEEK MEDICAL CARE IF:  You are unsure if you are in labor or if your water has broken.  You have dizziness.  You have mild pelvic cramps, pelvic pressure, or nagging pain in your abdominal area.  You have persistent nausea, vomiting, or diarrhea.  You have a bad smelling vaginal discharge.  You have pain with urination. SEEK IMMEDIATE MEDICAL CARE IF:   You have a fever.  You are leaking fluid from your vagina.  You have spotting or bleeding from your vagina.  You have severe abdominal cramping or pain.  You have rapid weight loss or gain.  You have shortness of breath with chest pain.  You notice sudden or extreme swelling   of your face, hands, ankles, feet, or legs.  You have not felt your baby move in over an hour.  You have severe headaches that do not go away with medicine.  You have vision changes. Document Released: 02/08/2001 Document Revised: 02/19/2013 Document Reviewed: 04/17/2012 ExitCare Patient Information 2015 [employer]. This information is not intended to replace advice given to you by your health care provider. Make sure you discuss any questions you have with your health care provider.  

## 2014-09-22 NOTE — Addendum Note (Signed)
Addended by: Jill Side on: 09/22/2014 05:41 PM   Modules accepted: Level of Service

## 2014-09-22 NOTE — Progress Notes (Signed)
Subjective:  Judy Wood is a 19 y.o. G2P1001 at [redacted]w[redacted]d being seen today for ongoing prenatal care.  Patient reports no complaints.  Contractions: Irregular.No increase.  Vag. Bleeding: None.No spotting or bleeding since last hospitalization 2-3 wks ago. Pelvic rest.  Movement: Present. Denies leaking of fluid.   The following portions of the patient's history were reviewed and updated as appropriate: allergies, current medications, past family history, past medical history, past social history, past surgical history and problem list.   Objective:   Filed Vitals:   09/22/14 1619  BP: 137/74  Pulse: 99  Temp: 98 F (36.7 C)  Weight: 267 lb 4.8 oz (121.246 kg)    Fetal Status: Fetal Heart Rate (bpm): 130   Movement: Present     General:  Alert, oriented and cooperative. Patient is in no acute distress.  Skin: Skin is warm and dry. No rash noted.   Cardiovascular: Normal heart rate noted  Respiratory: Normal respiratory effort, no problems with respiration noted  Abdomen: Soft, gravid, appropriate for gestational age. Pain/Pressure: Present     Vaginal: Vag. Bleeding: None.       Cervix: Not evaluated        Extremities: Normal range of motion.  Edema: Trace  Mental Status: Normal mood and affect. Normal behavior. Normal judgment and thought content.  FHR 130 Urinalysis: Urine Protein: 1+ Urine Glucose: Negative  Assessment and Plan:  Pregnancy: G2P1001 at [redacted]w[redacted]d 1. Obesity affecting pregnancy in third trimester   2. Poor fetal growth affecting management of mother in third trimester, antepartum, fetus 2   3. Placental abruption in third trimester       2. Poor fetal growth affecting management of mother in third trimester, antepartum, fetus 2 Aware of fetal testing and serial growth [place]  Preterm labor symptoms and general obstetric precautions including but not limited to vaginal bleeding, contractions, leaking of fluid and fetal movement were reviewed in detail with the  patient. Please refer to After Visit Summary for other counseling recommendations.  Return in about 1 week (around 09/29/2014). for PNV. Fetal testing 2x/wk. Delivery 37 wks.    Danae Orleans, CNM

## 2014-09-22 NOTE — Progress Notes (Signed)
[place] for growth on 7/28.

## 2014-09-22 NOTE — Addendum Note (Signed)
Addended by: Jill Side on: 09/22/2014 05:37 PM   Modules accepted: Level of Service

## 2014-09-25 ENCOUNTER — Ambulatory Visit (INDEPENDENT_AMBULATORY_CARE_PROVIDER_SITE_OTHER): Payer: Medicaid Other | Admitting: *Deleted

## 2014-09-25 ENCOUNTER — Encounter (HOSPITAL_COMMUNITY): Payer: Self-pay

## 2014-09-25 ENCOUNTER — Ambulatory Visit (HOSPITAL_COMMUNITY)
Admission: RE | Admit: 2014-09-25 | Discharge: 2014-09-25 | Disposition: A | Discharge status: Home / Self Care | Payer: Medicaid Other | Source: Ambulatory Visit | Attending: Obstetrics & Gynecology | Admitting: Obstetrics & Gynecology

## 2014-09-25 VITALS — BP 133/83 | HR 96

## 2014-09-25 DIAGNOSIS — O4593 Premature separation of placenta, unspecified, third trimester: Secondary | ICD-10-CM

## 2014-09-25 DIAGNOSIS — O10913 Unspecified pre-existing hypertension complicating pregnancy, third trimester: Secondary | ICD-10-CM | POA: Diagnosis not present

## 2014-09-25 DIAGNOSIS — Z3A00 Weeks of gestation of pregnancy not specified: Secondary | ICD-10-CM | POA: Diagnosis not present

## 2014-09-25 DIAGNOSIS — Z3A34 34 weeks gestation of pregnancy: Secondary | ICD-10-CM | POA: Insufficient documentation

## 2014-09-25 DIAGNOSIS — I10 Essential (primary) hypertension: Secondary | ICD-10-CM | POA: Diagnosis not present

## 2014-09-25 DIAGNOSIS — O365931 Maternal care for other known or suspected poor fetal growth, third trimester, fetus 1: Secondary | ICD-10-CM

## 2014-09-26 NOTE — Progress Notes (Signed)
NST reviewed and reactive.  Maris Bena L. Harraway-Smith, M.D., FACOG    

## 2014-09-29 ENCOUNTER — Inpatient Hospital Stay (HOSPITAL_COMMUNITY)
Admission: AD | Admit: 2014-09-29 | Discharge: 2014-10-06 | DRG: 781 | Disposition: A | Discharge status: Home / Self Care | Payer: Medicaid Other | Source: Ambulatory Visit | Attending: Family Medicine | Admitting: Family Medicine

## 2014-09-29 ENCOUNTER — Inpatient Hospital Stay (HOSPITAL_COMMUNITY): Payer: Medicaid Other

## 2014-09-29 ENCOUNTER — Encounter (HOSPITAL_COMMUNITY): Payer: Self-pay | Admitting: *Deleted

## 2014-09-29 ENCOUNTER — Ambulatory Visit (INDEPENDENT_AMBULATORY_CARE_PROVIDER_SITE_OTHER): Payer: Medicaid Other | Admitting: Obstetrics & Gynecology

## 2014-09-29 VITALS — BP 118/96 | HR 96 | Wt 268.9 lb

## 2014-09-29 DIAGNOSIS — O99333 Smoking (tobacco) complicating pregnancy, third trimester: Secondary | ICD-10-CM | POA: Diagnosis present

## 2014-09-29 DIAGNOSIS — O10913 Unspecified pre-existing hypertension complicating pregnancy, third trimester: Secondary | ICD-10-CM | POA: Diagnosis present

## 2014-09-29 DIAGNOSIS — O365930 Maternal care for other known or suspected poor fetal growth, third trimester, not applicable or unspecified: Secondary | ICD-10-CM | POA: Diagnosis present

## 2014-09-29 DIAGNOSIS — O10919 Unspecified pre-existing hypertension complicating pregnancy, unspecified trimester: Secondary | ICD-10-CM | POA: Diagnosis present

## 2014-09-29 DIAGNOSIS — O34219 Maternal care for unspecified type scar from previous cesarean delivery: Secondary | ICD-10-CM

## 2014-09-29 DIAGNOSIS — Z3A35 35 weeks gestation of pregnancy: Secondary | ICD-10-CM | POA: Diagnosis present

## 2014-09-29 DIAGNOSIS — O4593 Premature separation of placenta, unspecified, third trimester: Secondary | ICD-10-CM | POA: Diagnosis present

## 2014-09-29 DIAGNOSIS — F17210 Nicotine dependence, cigarettes, uncomplicated: Secondary | ICD-10-CM | POA: Diagnosis present

## 2014-09-29 DIAGNOSIS — N939 Abnormal uterine and vaginal bleeding, unspecified: Secondary | ICD-10-CM | POA: Diagnosis present

## 2014-09-29 DIAGNOSIS — I10 Essential (primary) hypertension: Secondary | ICD-10-CM

## 2014-09-29 DIAGNOSIS — O98113 Syphilis complicating pregnancy, third trimester: Secondary | ICD-10-CM | POA: Diagnosis not present

## 2014-09-29 DIAGNOSIS — O3421 Maternal care for scar from previous cesarean delivery: Secondary | ICD-10-CM | POA: Diagnosis present

## 2014-09-29 DIAGNOSIS — O4693 Antepartum hemorrhage, unspecified, third trimester: Secondary | ICD-10-CM

## 2014-09-29 DIAGNOSIS — I159 Secondary hypertension, unspecified: Secondary | ICD-10-CM | POA: Diagnosis not present

## 2014-09-29 DIAGNOSIS — Z8249 Family history of ischemic heart disease and other diseases of the circulatory system: Secondary | ICD-10-CM | POA: Diagnosis not present

## 2014-09-29 DIAGNOSIS — O4590 Premature separation of placenta, unspecified, unspecified trimester: Secondary | ICD-10-CM | POA: Diagnosis not present

## 2014-09-29 DIAGNOSIS — O99820 Streptococcus B carrier state complicating pregnancy: Secondary | ICD-10-CM | POA: Diagnosis present

## 2014-09-29 DIAGNOSIS — O98213 Gonorrhea complicating pregnancy, third trimester: Secondary | ICD-10-CM | POA: Insufficient documentation

## 2014-09-29 DIAGNOSIS — Z833 Family history of diabetes mellitus: Secondary | ICD-10-CM | POA: Diagnosis not present

## 2014-09-29 DIAGNOSIS — O99210 Obesity complicating pregnancy, unspecified trimester: Secondary | ICD-10-CM | POA: Diagnosis not present

## 2014-09-29 DIAGNOSIS — O98119 Syphilis complicating pregnancy, unspecified trimester: Secondary | ICD-10-CM | POA: Diagnosis present

## 2014-09-29 LAB — CBC
HEMATOCRIT: 29.3 % — AB (ref 36.0–46.0)
Hemoglobin: 10 g/dL — ABNORMAL LOW (ref 12.0–15.0)
MCH: 29.9 pg (ref 26.0–34.0)
MCHC: 34.1 g/dL (ref 30.0–36.0)
MCV: 87.7 fL (ref 78.0–100.0)
Platelets: 254 10*3/uL (ref 150–400)
RBC: 3.34 MIL/uL — ABNORMAL LOW (ref 3.87–5.11)
RDW: 13.6 % (ref 11.5–15.5)
WBC: 8.7 10*3/uL (ref 4.0–10.5)

## 2014-09-29 LAB — POCT URINALYSIS DIP (DEVICE)
BILIRUBIN URINE: NEGATIVE
GLUCOSE, UA: NEGATIVE mg/dL
Ketones, ur: NEGATIVE mg/dL
Nitrite: NEGATIVE
PROTEIN: NEGATIVE mg/dL
Specific Gravity, Urine: 1.015 (ref 1.005–1.030)
UROBILINOGEN UA: 1 mg/dL (ref 0.0–1.0)
pH: 7 (ref 5.0–8.0)

## 2014-09-29 LAB — TYPE AND SCREEN
ABO/RH(D): A POS
Antibody Screen: NEGATIVE

## 2014-09-29 MED ORDER — DOCUSATE SODIUM 100 MG PO CAPS
100.0000 mg | ORAL_CAPSULE | Freq: Every day | ORAL | Status: DC
  Administered 2014-09-30 – 2014-10-06 (×7): 100 mg via ORAL
  Filled 2014-09-29 (×9): qty 1

## 2014-09-29 MED ORDER — SERTRALINE HCL 50 MG PO TABS
50.0000 mg | ORAL_TABLET | Freq: Every day | ORAL | Status: DC
  Administered 2014-09-29 – 2014-10-05 (×7): 50 mg via ORAL
  Filled 2014-09-29 (×7): qty 1

## 2014-09-29 MED ORDER — PRENATAL PLUS 27-1 MG PO TABS
1.0000 | ORAL_TABLET | Freq: Every day | ORAL | Status: DC
  Administered 2014-09-30 – 2014-10-06 (×7): 1 via ORAL
  Filled 2014-09-29 (×8): qty 1

## 2014-09-29 MED ORDER — CALCIUM CARBONATE ANTACID 500 MG PO CHEW: 2.0000 | CHEWABLE_TABLET | ORAL | Status: DC | PRN

## 2014-09-29 MED ORDER — ZOLPIDEM TARTRATE 5 MG PO TABS: 5.0000 mg | ORAL_TABLET | Freq: Every evening | ORAL | Status: DC | PRN

## 2014-09-29 MED ORDER — ACETAMINOPHEN 325 MG PO TABS
650.0000 mg | ORAL_TABLET | ORAL | Status: DC | PRN
  Administered 2014-10-01 – 2014-10-05 (×4): 650 mg via ORAL
  Filled 2014-09-29 (×4): qty 2

## 2014-09-29 MED ORDER — LABETALOL HCL 100 MG PO TABS
100.0000 mg | ORAL_TABLET | Freq: Two times a day (BID) | ORAL | Status: DC
  Administered 2014-09-29 – 2014-10-06 (×14): 100 mg via ORAL
  Filled 2014-09-29 (×14): qty 1

## 2014-09-29 NOTE — Progress Notes (Signed)
Subjective:  Judy Wood is a 19 y.o. G2P1001 at [redacted]w[redacted]d being seen today for ongoing prenatal care.  Patient reports no complaints.  Contractions: Irregular.  Vag. Bleeding: None. Movement: Present. Denies leaking of fluid.   The following portions of the patient's history were reviewed and updated as appropriate: allergies, current medications, past family history, past medical history, past social history, past surgical history and problem list.   Objective:   Filed Vitals:   09/29/14 1127  BP: 118/96  Pulse: 96  Weight: 268 lb 14.4 oz (121.972 kg)    Fetal Status: Fetal Heart Rate (bpm): NST Fundal Height: 38 cm Movement: Present     General:  Alert, oriented and cooperative. Patient is in no acute distress.  Skin: Skin is warm and dry. No rash noted.   Cardiovascular: Normal heart rate noted  Respiratory: Normal respiratory effort, no problems with respiration noted  Abdomen: Soft, gravid, appropriate for gestational age. Pain/Pressure: Present     Vaginal: Vag. Bleeding: None.       Cervix: Not evaluated        Extremities: Normal range of motion.  Edema: Trace  Mental Status: Normal mood and affect. Normal behavior. Normal judgment and thought content.   Urinalysis: Urine Protein: Negative Urine Glucose: Negative U/S on 09/25/14  EFW 4 lb 6 oz (22%), AFI 15 cm, cephalic NST performed today was reviewed and was found to be reactive.  Continue recommended antenatal testing and prenatal care.  Assessment and Plan:  Pregnancy: G2P1001 at [redacted]w[redacted]d  1. Chronic hypertension complicating or reason for care during pregnancy, third trimester 2. Chronic placental abruption Stable BP on Labetalol. IOL at 37 weeks for chronic abruption. NST reactive.  3. Previous cesarean delivery, antepartum Desires TOLAC.  IOL to be scheduled at 37 weeks as per MFM  Preterm labor symptoms and general obstetric precautions including but not limited to vaginal bleeding, contractions, leaking of fluid  and fetal movement were reviewed in detail with the patient. Please refer to After Visit Summary for other counseling recommendations.  Return in about 1 week (around 10/06/2014) for OB Visit, Pelvic cultures, NST.  Continue testing as scheduled.   Tereso Newcomer, MD

## 2014-09-29 NOTE — MAU Provider Note (Signed)
History     CSN: [id number]  Arrival date and time: 09/29/14 1246   First Provider Initiated Contact with Patient 09/29/14 1424      Chief Complaint  Patient presents with  . Vaginal Bleeding   HPI  Judy Wood is a 19 y.o. G2P1001 at [redacted]w[redacted]d with CHTN and chronic placental abruption who presents to MAU today with complaint of vaginal bleeding since this afternoon. The patient was seen in the office this morning and had no complaints. She states that after that appointment she started gushing blood. She states that bleeding has improved some since onset. She is having some mild cramping as well. She did not have a speculum or cervical exam today. She has not taken anything for pain. She reports good fetal movement.  OB History    Gravida Para Term Preterm AB TAB SAB Ectopic Multiple Living   2 1 1       1       Past Medical History  Diagnosis Date  . Hypertension     on meds  . Hernia, umbilical   . Hypertension   . H/O umbilical hernia repair   . Asthma     pt states she took albuterol inhaler lately  . Syphilis 08/30/2014    Treated    Past Surgical History  Procedure Laterality Date  . Umbilical hernia repair      as a child  . Umbilical hernia repair    . Cesarean section N/A 05/19/2012    Procedure:  Primary CESAREAN SECTION  of baby girl  at 1933  APGAR 4/5/5;  Surgeon: Adam Phenix, MD;  Location: [facility];  Service: Obstetrics;  Laterality: N/A;    Family History  Problem Relation Age of Onset  . Hypertension Mother   . Anemia Mother   . Depression Mother   . Deep vein thrombosis Maternal Grandmother   . Diabetes Maternal Grandmother   . Kidney disease Maternal Grandmother   . Cancer Maternal Grandmother     colon cancer  . Diabetes Father     History  Substance Use Topics  . Smoking status: Light Tobacco Smoker -- 0.25 packs/day    Types: Cigarettes  . Smokeless tobacco: Never Used  . Alcohol Use: No     Comment: Stopped smoking marijuana 3  weeks ago    Allergies:  Allergies  Allergen Reactions  . Banana Anaphylaxis  . Lactose Intolerance (Gi) Diarrhea and Nausea Only  . Sulfa Antibiotics Other (See Comments)    Childhood reaction    Prescriptions prior to admission  Medication Sig Dispense Refill Last Dose  . aspirin EC 81 MG tablet Take 1 tablet (81 mg total) by mouth daily. 90 tablet 3 09/28/2014 at Unknown time  . labetalol (NORMODYNE) 100 MG tablet Take 0.5 tablets (50 mg total) by mouth 2 (two) times daily. (Patient taking differently: Take 100 mg by mouth 2 (two) times daily. ) 60 tablet 2 09/28/2014 at 2000  . prenatal vitamin w/FE, FA (PRENATAL 1 + 1) 27-1 MG TABS tablet Take 1 tablet by mouth daily at 12 noon.   09/28/2014 at Unknown time  . sertraline (ZOLOFT) 50 MG tablet Take 1 tablet (50 mg total) by mouth at bedtime. 30 tablet 1 09/28/2014 at Unknown time  . NIFEdipine (PROCARDIA) 10 MG capsule Take 1 tablet with contractions. If contractions persist can take 1 tablet 10 minutes later. Do not exceed 40 mg in 1 hour. (Patient not taking: Reported on 09/29/2014) 30 capsule  3 Not Taking    Review of Systems  Constitutional: Negative for fever and malaise/fatigue.  Gastrointestinal: Positive for abdominal pain.  Genitourinary:       + vaginal bleeding Neg - vaginal discharge, LOF   Physical Exam   Blood pressure 143/83, pulse 81, temperature 98.7 F (37.1 C), resp. rate 18, height  (1.651 m), last menstrual period 01/04/2014.  Physical Exam  Nursing note and vitals reviewed. Constitutional: She is oriented to person, place, and time. She appears well-developed and well-nourished. No distress.  HENT:  Head: Normocephalic and atraumatic.  Cardiovascular: Normal rate.   Respiratory: Effort normal.  GI: Soft. She exhibits no distension and no mass. There is no tenderness. There is no rebound and no guarding.  Genitourinary:  Small amount of blood on pad prior to [place].   Neurological: She is alert and  oriented to person, place, and time.  Skin: Skin is warm and dry. No erythema.  Psychiatric: She has a normal mood and affect.   Results for orders placed or performed during the hospital encounter of 09/29/14 (from the past 24 hour(s))  CBC     Status: Abnormal   Collection Time: 09/29/14  1:30 PM  Result Value Ref Range   WBC 8.7 4.0 - 10.5 K/uL   RBC 3.34 (L) 3.87 - 5.11 MIL/uL   Hemoglobin 10.0 (L) 12.0 - 15.0 g/dL   HCT 16.1 (L) 09.6 - 04.5 %   MCV 87.7 78.0 - 100.0 fL   MCH 29.9 26.0 - 34.0 pg   MCHC 34.1 30.0 - 36.0 g/dL   RDW 40.9 81.1 - 91.4 %   Platelets 254 150 - 400 K/uL   Fetal Monitoring: Baseline: 125 bpm, moderate variability, + accelerations, no decelerations Contractions: initially none, then q 4-6 minutes  MAU Course  Procedures None  MDM Small amount of blood noted on pad prior to [place]. Stable for [place] in radiology [place] today - preliminary [place] report shows no evidence of previa or abruption 1600 - Care turned over to Karyl Kinnier, MD  Marny Lowenstein, PA-C  09/29/2014, 4:06 PM  Assessment and Plan

## 2014-09-29 NOTE — H&P (Signed)
FACULTY PRACTICE ANTEPARTUM ADMISSION HISTORY AND PHYSICAL NOTE  History of Present Illness: Judy Wood is a 19 y.o. G2P1001 at [redacted]w[redacted]d admitted for vaginal bleeding   Pregnancy is complicated by cHTN on Labetalol and ASA, chronic placental abruption (admitted x2) and multiple STis in pregnancy including gonorrhea and syphilis s/p treatment who presents to MAU today with complaint of vaginal bleeding since this afternoon. The patient was seen in the office this morning and had no complaints. She states that after that appointment she started gushing blood. She states that bleeding has improved some since onset. She is having some mild cramping as well. She did not have a speculum or cervical exam today. She has not taken anything for pain. She reports good fetal movement.  Patient reports the fetal movement as active. Patient reports uterine contraction  activity as none. Patient reports  vaginal bleeding as flow about like a period. Patient describes fluid per vagina as None. Fetal presentation is cephalic.  Patient Active Problem List   Diagnosis Date Noted  . Vaginal bleeding 09/29/2014  . Placental abruption in third trimester   . Poor fetal growth affecting management of mother in third trimester, antepartum   . Group B Streptococcus carrier, +RV culture, currently pregnant 09/01/2014  . Syphilis affecting pregnancy, antepartum 08/31/2014  . Vaginal bleeding during pregnancy, antepartum   . Obesity affecting pregnancy   . Chronic hypertension complicating or reason for care during pregnancy 04/22/2014  . Previous cesarean delivery, antepartum 05/23/2012  . Hypertension 10/17/2011    Past Medical History  Diagnosis Date  . Hypertension     on meds  . Hernia, umbilical   . Hypertension   . H/O umbilical hernia repair   . Asthma     pt states she took albuterol inhaler lately  . Syphilis 08/30/2014    Treated    Past Surgical History  Procedure Laterality Date  . Umbilical  hernia repair      as a child  . Umbilical hernia repair    . Cesarean section N/A 05/19/2012    Procedure:  Primary CESAREAN SECTION  of baby girl  at 1933  APGAR 4/5/5;  Surgeon: Adam Phenix, MD;  Location: [facility];  Service: Obstetrics;  Laterality: N/A;    OB History  Gravida Para Term Preterm AB SAB TAB Ectopic Multiple Living  2 1 1       1     # Outcome Date GA Lbr Len/2nd Weight Sex Delivery Anes PTL Lv  2 Current           1 Term 05/19/12 [redacted]w[redacted]d  6 lb 5.9 oz (2.889 kg) F CS-LTranv EPI  Y      History   Social History  . Marital Status: Single    Spouse Name: N/A  . Number of Children: N/A  . Years of Education: N/A   Social History Main Topics  . Smoking status: Light Tobacco Smoker -- 0.25 packs/day    Types: Cigarettes  . Smokeless tobacco: Never Used  . Alcohol Use: No     Comment: Stopped smoking marijuana 3 weeks ago  . Drug Use: Yes    Special: Marijuana     Comment: Stopped when she found out she was pregnant   . Sexual Activity: Yes    Birth Control/ Protection: None   Other Topics Concern  . None   Social History Narrative    Family History  Problem Relation Age of Onset  . Hypertension Mother   .  Anemia Mother   . Depression Mother   . Deep vein thrombosis Maternal Grandmother   . Diabetes Maternal Grandmother   . Kidney disease Maternal Grandmother   . Cancer Maternal Grandmother     colon cancer  . Diabetes Father     Allergies  Allergen Reactions  . Banana Anaphylaxis  . Lactose Intolerance (Gi) Diarrhea and Nausea Only  . Sulfa Antibiotics Other (See Comments)    Childhood reaction    Prescriptions prior to admission  Medication Sig Dispense Refill Last Dose  . aspirin EC 81 MG tablet Take 1 tablet (81 mg total) by mouth daily. 90 tablet 3 09/28/2014 at Unknown time  . labetalol (NORMODYNE) 100 MG tablet Take 0.5 tablets (50 mg total) by mouth 2 (two) times daily. (Patient taking differently: Take 100 mg by mouth 2 (two) times  daily. ) 60 tablet 2 09/29/2014 at 2000  . prenatal vitamin w/FE, FA (PRENATAL 1 + 1) 27-1 MG TABS tablet Take 1 tablet by mouth daily at 12 noon.   09/29/2014 at Unknown time  . sertraline (ZOLOFT) 50 MG tablet Take 1 tablet (50 mg total) by mouth at bedtime. 30 tablet 1 09/29/2014 at Unknown time  . NIFEdipine (PROCARDIA) 10 MG capsule Take 1 tablet with contractions. If contractions persist can take 1 tablet 10 minutes later. Do not exceed 40 mg in 1 hour. (Patient not taking: Reported on 09/29/2014) 30 capsule 3 Not Taking    Review of Systems - Negative except vaginal bleeding  Vitals:  BP 143/83 mmHg  Pulse 81  Temp(Src) 98.7 F (37.1 C)  Resp 18  Ht 5\' 5"  (1.651 m)  LMP 01/04/2014 Physical Examination: CONSTITUTIONAL: Well-developed, well-nourished female in no acute distress. Tearful.  HENT:  Normocephalic, atraumatic, External right and left ear normal. Oropharynx is clear and moist EYES: Conjunctivae and EOM are normal. Pupils are equal, round, and reactive to light. No scleral icterus.  NECK: Normal range of motion, supple, no masses SKIN: Skin is warm and dry. No rash noted. Not diaphoretic. No erythema. No pallor. NEUROLGIC: Alert and oriented to person, place, and time. Normal reflexes, muscle tone coordination. No cranial nerve deficit noted. PSYCHIATRIC: Normal mood and affect. Normal behavior. Normal judgment and thought content. CARDIOVASCULAR: Normal heart rate noted, regular rhythm RESPIRATORY: Effort and breath sounds normal, no problems with respiration noted ABDOMEN: Soft, nontender, nondistended, gravid. MUSCULOSKELETAL: Normal range of motion. No edema and no tenderness. 2+ distal pulses.  Cervix: Dilation: Fingertip Exam by:: Dr Alvester Morin  Membranes:intact Fetal Monitoring:Baseline: 135 bpm, Variability: Good {> 6 bpm), Accelerations: Reactive and Decelerations: Absent Tocometer: irritable  Labs:  Results for orders placed or performed during the hospital encounter  of 09/29/14 (from the past 24 hour(s))  CBC   Collection Time: 09/29/14  1:30 PM  Result Value Ref Range   WBC 8.7 4.0 - 10.5 K/uL   RBC 3.34 (L) 3.87 - 5.11 MIL/uL   Hemoglobin 10.0 (L) 12.0 - 15.0 g/dL   HCT 16.1 (L) 09.6 - 04.5 %   MCV 87.7 78.0 - 100.0 fL   MCH 29.9 26.0 - 34.0 pg   MCHC 34.1 30.0 - 36.0 g/dL   RDW 40.9 81.1 - 91.4 %   Platelets 254 150 - 400 K/uL  Results for orders placed or performed in visit on 09/29/14 (from the past 24 hour(s))  POCT urinalysis dip (device)   Collection Time: 09/29/14 11:09 AM  Result Value Ref Range   Glucose, UA NEGATIVE NEGATIVE mg/dL   Bilirubin  Urine NEGATIVE NEGATIVE   Ketones, ur NEGATIVE NEGATIVE mg/dL   Specific Gravity, Urine 1.015 1.005 - 1.030   Hgb urine dipstick TRACE (A) NEGATIVE   pH 7.0 5.0 - 8.0   Protein, ur NEGATIVE NEGATIVE mg/dL   Urobilinogen, UA 1.0 0.0 - 1.0 mg/dL   Nitrite NEGATIVE NEGATIVE   Leukocytes, UA TRACE (A) NEGATIVE    Imaging Studies: 09/01/2014- OB US 09/04/2014- OB US 09/15/2014 - OB US 09/25/2014- OB US- Oligohydramnios resolved.  EFW 22nd%tile. HC<3rd%ile (~2SD below mean). Normal cranial anatomy previously noted. HL and FL measure <5th%tile but appear normal morphology- fetal skeletal dysplasia is unlikely. AFI normal   Assessment and Plan: Patient Active Problem List   Diagnosis Date Noted  . Vaginal bleeding 09/29/2014  . Placental abruption in third trimester   . Poor fetal growth affecting management of mother in third trimester, antepartum   . Group B Streptococcus carrier, +RV culture, currently pregnant 09/01/2014  . Syphilis affecting pregnancy, antepartum 08/31/2014  . Vaginal bleeding during pregnancy, antepartum   . Obesity affecting pregnancy   . Chronic hypertension complicating or reason for care during pregnancy 04/22/2014  . Previous cesarean delivery, antepartum 05/23/2012  . Hypertension 10/17/2011   #Acute on Chronic Abruption:  Bleed at approximately 1300 PM on  09/29/2014. No abruption on preliminary read today.  - Type and Screen  - Stable CBC is reassuring (10.0 hgb today) - Monitor for 7d   #cHTN affecting pregnancy:  Stable and at goal today. Continue labetalol  #Previous CS for footling breech: signed TOLAC on 06/19/2014  #Syphllis (RPR 1:32 and TPa POS on 7/2), s/p treatment on 7/3. Will need post partum follow up of titers at 3, 6 12 months  #Gonorrhea POS- 07/29/14,  S/p rocephin and azithromycin (on 6/2). Ordered TOC urine today  #FWB:  Growth [place] on 7/28 showed HC < 3rd% -s/p BMZ on 5/31 and 6/1. No need for repeat dosing.  -NST BID  #Labor: scheduled IOL at 37 weeks, August 14 #GBS: POS on 07/29/2014  Dispo: Admit to Antenatal. Will need to be 7d without bleeding before discharge Code: Full  Federico Flake, MD Fellow Obstetrician & Gynecologist Faculty [facility], [facility]

## 2014-09-29 NOTE — MAU Note (Signed)
C/o sudden vaginal bleeding that started after her clinic appointment- (around noon); having a lot of vaginal pressure;

## 2014-09-29 NOTE — Patient Instructions (Signed)
Return to clinic for any obstetric concerns or go to [facility] for evaluation  

## 2014-09-29 NOTE — Progress Notes (Signed)
[place] for growth done 7/28.  Pt states she is taking Labetalol differently - takes 1 tab (100 mg) BID.

## 2014-09-30 LAB — COMPREHENSIVE METABOLIC PANEL
ALT: 9 U/L — ABNORMAL LOW (ref 14–54)
ANION GAP: 1 — AB (ref 5–15)
AST: 14 U/L — AB (ref 15–41)
Albumin: 2.9 g/dL — ABNORMAL LOW (ref 3.5–5.0)
Alkaline Phosphatase: 77 U/L (ref 38–126)
BUN: 6 mg/dL (ref 6–20)
CO2: 24 mmol/L (ref 22–32)
CREATININE: 0.45 mg/dL (ref 0.44–1.00)
Calcium: 8.3 mg/dL — ABNORMAL LOW (ref 8.9–10.3)
Chloride: 110 mmol/L (ref 101–111)
GFR calc non Af Amer: >60 mL/min (ref >60)
GLUCOSE: 85 mg/dL (ref 65–99)
POTASSIUM: 4 mmol/L (ref 3.5–5.1)
SODIUM: 135 mmol/L (ref 135–145)
Total Bilirubin: 0.3 mg/dL (ref 0.3–1.2)
Total Protein: 6.4 g/dL — ABNORMAL LOW (ref 6.5–8.1)

## 2014-09-30 LAB — PROTEIN / CREATININE RATIO, URINE
Creatinine, Urine: 72 mg/dL
PROTEIN CREATININE RATIO: 0.25 mg/mg{creat} — AB (ref 0.00–0.15)
Total Protein, Urine: 18 mg/dL

## 2014-09-30 NOTE — Progress Notes (Addendum)
Patient ID: [name], female   DOB: 23-Jan-1996, 19 y.o.   MRN: [id number] FACULTY PRACTICE ANTEPARTUM(COMPREHENSIVE) NOTE  Judy Wood is a 19 y.o. G2P1001 at [redacted]w[redacted]d by early ultrasound who is admitted for vaginal bleeding.   Fetal presentation is cephalic. Length of Stay:  1  Days  Subjective: No further bleeding, some light spotting.  Having some contractions, but not regular.  Some lower abdomen discomfort intermittently.  Denies headache, vision changes or RUQ pain. Patient reports the fetal movement as active. Patient reports uterine contraction  activity as none. Patient reports  vaginal bleeding as scant staining. Patient describes fluid per vagina as None.  Vitals:  Blood pressure 100/64, pulse 94, temperature 98.3 F (36.8 C), temperature source Oral, resp. rate 20, height  (1.651 m), weight 268 lb (121.564 kg), last menstrual period 01/04/2014. Physical Examination:  General appearance - alert, well appearing, and in no distress Heart: regular rate, no murmur Lungs: clear to auscultation bilaterally, no wheezing. Abdomen - soft, gravid, Non-tender Neurological - alert, oriented, normal speech, no focal findings or movement disorder noted Skin - normal coloration and turgor, no rashes, no suspicious skin lesions noted Fundal Height:  size equals dates. Extremities: Homans sign is negative, no sign of DVT  Membranes:intact  Fetal Monitoring:  Baseline: 135 bpm, Variability: Good {> 6 bpm), Accelerations: Reactive and Decelerations: Absent  Labs:  Results for orders placed or performed during the hospital encounter of 09/29/14 (from the past 24 hour(s))  Protein / creatinine ratio, urine   Collection Time: 09/30/14  7:50 AM  Result Value Ref Range   Creatinine, Urine 72.00 mg/dL   Total Protein, Urine 18 mg/dL   Protein Creatinine Ratio 0.25 (H) 0.00 - 0.15 mg/mg[Cre]  Comprehensive metabolic panel   Collection Time: 09/30/14  8:23 AM  Result Value Ref Range   Sodium 135 135 - 145 mmol/L   Potassium 4.0 3.5 - 5.1 mmol/L   Chloride 110 101 - 111 mmol/L   CO2 24 22 - 32 mmol/L   Glucose, Bld 85 65 - 99 mg/dL   BUN 6 6 - 20 mg/dL   Creatinine, Ser 4.03 0.44 - 1.00 mg/dL   Calcium 8.3 (L) 8.9 - 10.3 mg/dL   Total Protein 6.4 (L) 6.5 - 8.1 g/dL   Albumin 2.9 (L) 3.5 - 5.0 g/dL   AST 14 (L) 15 - 41 U/L   ALT 9 (L) 14 - 54 U/L   Alkaline Phosphatase 77 38 - 126 U/L   Total Bilirubin 0.3 0.3 - 1.2 mg/dL   GFR calc non Af Amer >60 >60 mL/min   GFR calc Af Amer >60 >60 mL/min   Anion gap 1 (L) 5 - 15    Imaging Studies:    U/S 8/1 shows vertex, normal fluid, no obvious reason for bleeding. U/S 7/28 shows growth at 22%.  Medications:  Scheduled . docusate sodium  100 mg Oral Daily  . labetalol  100 mg Oral BID  . prenatal vitamin w/FE, FA  1 tablet Oral Q1200  . sertraline  50 mg Oral QHS   I have reviewed the patient's current medications.  ASSESSMENT: Principal Problem:   Placental abruption in third trimester Active Problems:   Hypertension   Previous cesarean delivery, antepartum   Chronic hypertension complicating or reason for care during pregnancy   Obesity affecting pregnancy   Syphilis affecting pregnancy, antepartum   Group B Streptococcus carrier, +RV culture, currently pregnant   Poor fetal growth affecting management of  mother in third trimester, antepartum   Vaginal bleeding   PLAN: 1.  Placental abruption in third trimester.  Improved bleeding.  Continue to monitor for 7 days.  Hg continues to be stable 2.  Hypertension - Chronic.Given new rise in BP--will check PIH labs.  Preeclampsia labs normal - Urine P:C 0.25.  May be developing.  Repeat if BP increases again.  BP improved.  Continue to monitor closely.  Continue labetalol 100mg  BID. 3.  Pregnancy  NST reactive  Continue NST q shift.   Rhona Raider Stinson, DO  09/30/2014,7:53 AM

## 2014-10-01 DIAGNOSIS — O99213 Obesity complicating pregnancy, third trimester: Secondary | ICD-10-CM

## 2014-10-01 DIAGNOSIS — O98113 Syphilis complicating pregnancy, third trimester: Secondary | ICD-10-CM

## 2014-10-01 NOTE — Progress Notes (Signed)
Patient ID: Judy Wood, female   DOB: 1995-09-08, 19 y.o.   MRN: [id number] FACULTY PRACTICE ANTEPARTUM(COMPREHENSIVE) NOTE  Judy Wood is a 19 y.o. G2P1001 at [redacted]w[redacted]d by early ultrasound who is admitted for vaginal bleeding.   Fetal presentation is cephalic. Length of Stay:  2  Days  Subjective: No further bleeding, some light spotting.  Having some contractions, but not regular.  Some lower abdomen discomfort intermittently.  Denies headache, vision changes or RUQ pain. Patient reports the fetal movement as active. Patient reports uterine contraction  activity as none. Patient reports  vaginal bleeding as scant staining. Patient describes fluid per vagina as None.  Vitals:  Blood pressure 100/64, pulse 94, temperature 98.3 F (36.8 C), temperature source Oral, resp. rate 20, height  (1.651 m), weight 268 lb (121.564 kg), last menstrual period 01/04/2014. Physical Examination:  General appearance - alert, well appearing, and in no distress Heart: regular rate, no murmur Lungs: clear to auscultation bilaterally, no wheezing. Abdomen - soft, gravid, Non-tender Neurological - alert, oriented, normal speech, no focal findings or movement disorder noted Skin - normal coloration and turgor, no rashes, no suspicious skin lesions noted Fundal Height:  size equals dates. Extremities: Homans sign is negative, no sign of DVT  Membranes:intact  Fetal Monitoring:  Baseline: 135 bpm, Variability: Good {> 6 bpm), Accelerations: Reactive and Decelerations: Absent  Labs:  Results for orders placed or performed during the hospital encounter of 09/29/14 (from the past 24 hour(s))  Comprehensive metabolic panel   Collection Time: 09/30/14  8:23 AM  Result Value Ref Range   Sodium 135 135 - 145 mmol/L   Potassium 4.0 3.5 - 5.1 mmol/L   Chloride 110 101 - 111 mmol/L   CO2 24 22 - 32 mmol/L   Glucose, Bld 85 65 - 99 mg/dL   BUN 6 6 - 20 mg/dL   Creatinine, Ser 7.82 0.44 - 1.00 mg/dL   Calcium 8.3 (L) 8.9 - 10.3 mg/dL   Total Protein 6.4 (L) 6.5 - 8.1 g/dL   Albumin 2.9 (L) 3.5 - 5.0 g/dL   AST 14 (L) 15 - 41 U/L   ALT 9 (L) 14 - 54 U/L   Alkaline Phosphatase 77 38 - 126 U/L   Total Bilirubin 0.3 0.3 - 1.2 mg/dL   GFR calc non Af Amer >60 >60 mL/min   GFR calc Af Amer >60 >60 mL/min   Anion gap 1 (L) 5 - 15    Imaging Studies:    U/S 8/1 shows vertex, normal fluid, no obvious reason for bleeding. U/S 7/28 shows growth at 22%.  Medications:  Scheduled . docusate sodium  100 mg Oral Daily  . labetalol  100 mg Oral BID  . prenatal vitamin w/FE, FA  1 tablet Oral Q1200  . sertraline  50 mg Oral QHS   I have reviewed the patient's current medications.  ASSESSMENT: Principal Problem:   Placental abruption in third trimester Active Problems:   Chronic hypertension complicating or reason for care during pregnancy   Hypertension   Previous cesarean delivery, antepartum   Obesity affecting pregnancy   Syphilis affecting pregnancy, antepartum   Group B Streptococcus carrier, +RV culture, currently pregnant   Poor fetal growth affecting management of mother in third trimester, antepartum   Vaginal bleeding   PLAN: 1.  Placental abruption in third trimester.  Improved bleeding.  Continue to monitor for 7 days.  Hg continues to be stable 2.  Hypertension - Chronic.Given new rise in  BP--will check PIH labs.  Preeclampsia labs normal - Urine P:C 0.25.  May be developing.  Repeat if BP increases again.  BP improved.  Continue to monitor closely.  Continue labetalol 100mg  BID. 3.  Pregnancy  NST reactive  Continue NST q shift.   Rhona Raider Stinson, DO  10/01/2014,7:50 AM

## 2014-10-02 ENCOUNTER — Other Ambulatory Visit: Payer: Medicaid Other

## 2014-10-02 DIAGNOSIS — E669 Obesity, unspecified: Secondary | ICD-10-CM

## 2014-10-02 DIAGNOSIS — O4590 Premature separation of placenta, unspecified, unspecified trimester: Secondary | ICD-10-CM

## 2014-10-02 DIAGNOSIS — O99210 Obesity complicating pregnancy, unspecified trimester: Secondary | ICD-10-CM

## 2014-10-02 DIAGNOSIS — O3421 Maternal care for scar from previous cesarean delivery: Secondary | ICD-10-CM

## 2014-10-02 DIAGNOSIS — I159 Secondary hypertension, unspecified: Secondary | ICD-10-CM

## 2014-10-02 LAB — TYPE AND SCREEN
ABO/RH(D): A POS
Antibody Screen: NEGATIVE

## 2014-10-02 NOTE — Progress Notes (Signed)
Patient ID: Judy Wood, female   DOB: [birth year], 19 y.o.   MRN: [id number] Judy Wood is a 19 y.o. G2P1001 at [redacted]w[redacted]d by early ultrasound who is admitted for vaginal bleeding.  Fetal presentation is cephalic. Length of Stay: 3 Days  Subjective: No further bleeding or spotting. No contractions. No abdominal pain.  Patient reports the fetal movement as active. Patient reports uterine contraction activity as none. Patient reports vaginal bleeding as scant staining. Patient describes fluid per vagina as None.  Vitals: BP 120/59 mmHg  Pulse 90  Temp(Src) 98.4 F (36.9 C) (Oral)  Resp 18  Ht  (1.651 m)  Wt 271 lb 8 oz (123.152 kg)  BMI 45.18 kg/m2  LMP 01/04/2014  Physical Examination: General appearance - alert, well appearing, and in no distress Heart: regular rate, no murmur Lungs: clear to auscultation bilaterally, no wheezing. Abdomen - soft, gravid, Non-tender Neurological - alert, oriented, normal speech, no focal findings or movement disorder noted Skin - normal coloration and turgor, no rashes, no suspicious skin lesions noted Extremities: Homans sign is negative, no sign of DVT  Membranes:intact  Fetal Monitoring: Baseline: 130's bpm, Variability: Good {> 6 bpm), Accelerations: Reactive and Decelerations: Absent Toco: no ctx  Labs:  Results for orders placed or performed during the hospital encounter of 09/29/14 (from the past 24 hour(s))  Comprehensive metabolic panel   Collection Time: 09/30/14 8:23 AM  Result Value Ref Range   Sodium 135 135 - 145 mmol/L   Potassium 4.0 3.5 - 5.1 mmol/L   Chloride 110 101 - 111 mmol/L   CO2 24 22 - 32 mmol/L   Glucose, Bld 85 65 - 99 mg/dL   BUN 6 6 - 20 mg/dL   Creatinine, Ser 4.09 0.44 - 1.00 mg/dL   Calcium 8.3 (L) 8.9 - 10.3 mg/dL   Total Protein 6.4 (L) 6.5 - 8.1 g/dL   Albumin 2.9 (L) 3.5 - 5.0 g/dL   AST 14 (L) 15 - 41 U/L   ALT 9 (L) 14 - 54  U/L   Alkaline Phosphatase 77 38 - 126 U/L   Total Bilirubin 0.3 0.3 - 1.2 mg/dL   GFR calc non Af Amer >60 >60 mL/min   GFR calc Af Amer >60 >60 mL/min   Anion gap 1 (L) 5 - 15    Imaging Studies:  U/S 8/1 shows vertex, normal fluid, no obvious reason for bleeding. U/S 7/28 shows growth at 22%.  Medications: Scheduled . docusate sodium 100 mg Oral Daily  . labetalol 100 mg Oral BID  . prenatal vitamin w/FE, FA 1 tablet Oral Q1200  . sertraline 50 mg Oral QHS   I have reviewed the patient's current medications.  ASSESSMENT: Principal Problem:  Placental abruption in third trimester Active Problems:  Chronic hypertension complicating or reason for care during pregnancy  Hypertension  Previous cesarean delivery, antepartum  Obesity affecting pregnancy  Syphilis affecting pregnancy, antepartum  Group B Streptococcus carrier, +RV culture, currently pregnant  Poor fetal growth affecting management of mother in third trimester, antepartum  Vaginal bleeding   PLAN: 1. Placental abruption in third trimester.  Improved bleeding. Continue to monitor for 7 days.  Hg continues to be stable   2. Pregnancy  NST reactive  Continue NST q shift. 3.  Hypertension - Chronic.Marland Kitchen Continue labetalol  BID.

## 2014-10-03 NOTE — Progress Notes (Signed)
Patient ID: Judy Wood, female   DOB: Jun 05, 1995, 19 y.o.   MRN: [id number] FACULTY PRACTICE ANTEPARTUM(COMPREHENSIVE) NOTE  Judy Wood is a 19 y.o. G2P1001 at [redacted]w[redacted]d by early ultrasound who is admitted for vaginal bleeding.   Fetal presentation is cephalic. Length of Stay:  4  Days  Subjective: Reports no bleeding or staining or spotting.  No contractions.  Denies headache, vision changes or RUQ pain. Patient reports the fetal movement as active. Patient reports uterine contraction  activity as none. Patient reports  vaginal bleeding as none. Patient describes fluid per vagina as None.  Vitals:  Blood pressure 112/52, pulse 81, temperature 98.4 F (36.9 C), temperature source Oral, resp. rate 20, height  (1.651 m), weight 271 lb 8 oz (123.152 kg), last menstrual period 01/04/2014. Physical Examination:  General appearance - alert, well appearing, and in no distress Heart: regular rate, no murmur Lungs: clear to auscultation bilaterally, no wheezing. Abdomen - soft, gravid, Non-tender Neurological - alert, oriented, normal speech, no focal findings or movement disorder noted Skin - normal coloration and turgor, no rashes, no suspicious skin lesions noted Fundal Height:  size equals dates. Extremities: Homans sign is negative, no sign of DVT  Membranes:intact  Fetal Monitoring:  Baseline: 135 bpm, Variability: Good {> 6 bpm), Accelerations: Reactive and Decelerations: Absent  Labs:  Results for orders placed or performed during the hospital encounter of 09/29/14 (from the past 24 hour(s))  Type and screen   Collection Time: 10/02/14  4:00 PM  Result Value Ref Range   ABO/RH(D) A POS    Antibody Screen NEG    Sample Expiration 10/05/2014     Imaging Studies:    U/S 8/1 shows vertex, normal fluid, no obvious reason for bleeding. U/S 7/28 shows growth at 22%.  Medications:  Scheduled . docusate sodium  100 mg Oral Daily  . labetalol  100 mg Oral BID  . prenatal  vitamin w/FE, FA  1 tablet Oral Q1200  . sertraline  50 mg Oral QHS   I have reviewed the patient's current medications.  ASSESSMENT: Principal Problem:   Placental abruption in third trimester Active Problems:   Chronic hypertension complicating or reason for care during pregnancy   Hypertension   Previous cesarean delivery, antepartum   Obesity affecting pregnancy   Syphilis affecting pregnancy, antepartum   Group B Streptococcus carrier, +RV culture, currently pregnant   Poor fetal growth affecting management of mother in third trimester, antepartum   Vaginal bleeding   PLAN: 1.  Placental abruption in third trimester.  Improved bleeding.  Continue to monitor for 7 days.  Delivery for maternal or fetal concern.  2.  Hypertension - Chronic.  Continue labetalol  BID.  3.  Pregnancy  NST reactive  Continue NST q shift.   Levie Heritage, DO  10/03/2014,6:42 AM

## 2014-10-04 DIAGNOSIS — O4593 Premature separation of placenta, unspecified, third trimester: Principal | ICD-10-CM

## 2014-10-04 DIAGNOSIS — O10913 Unspecified pre-existing hypertension complicating pregnancy, third trimester: Secondary | ICD-10-CM

## 2014-10-04 DIAGNOSIS — I10 Essential (primary) hypertension: Secondary | ICD-10-CM

## 2014-10-04 NOTE — Progress Notes (Signed)
Patient ID: Judy Wood, female   DOB: 11-Feb-1996, 19 y.o.   MRN: [id number] FACULTY PRACTICE ANTEPARTUM(COMPREHENSIVE) NOTE  JAYLEEN SCAGLIONE is a 19 y.o. G2P1001 at [redacted]w[redacted]d by early ultrasound who was admitted on 09/28/13 for vaginal bleeding in the setting of known chronic placental abruption.  This is is her third admission. .   Fetal presentation is cephalic.  Length of Stay:  5  Days  Subjective: Reports no bleeding or staining or spotting since admission on 09/28/13.  No contractions.  Denies headache, vision changes or RUQ pain. Patient reports the fetal movement as active. Patient reports uterine contraction  activity as none. Patient reports  vaginal bleeding as none. Patient describes fluid per vagina as None.  Vitals:  Blood pressure 137/69, pulse 92, temperature 98.1 F (36.7 C), temperature source Oral, resp. rate 20, height  (1.651 m), weight 271 lb 8 oz (123.152 kg), last menstrual period 01/04/2014. Physical Examination: General appearance - alert, well appearing, and in no distress Heart: regular rate, no murmur Lungs: clear to auscultation bilaterally, no wheezing. Abdomen - soft, gravid, Non-tender Neurological - alert, oriented, normal speech, no focal findings or movement disorder noted Skin - normal coloration and turgor, no rashes, no suspicious skin lesions noted Fundal Height:  size equals dates. Extremities: Homans sign is negative, no sign of DVT  Membranes:intact  Fetal Monitoring:  Baseline: 135 bpm, Variability: Good {> 6 bpm), Accelerations: Reactive and Decelerations: Absent  Labs:  No results found for this or any previous visit (from the past 24 hour(s)).  Imaging Studies:    U/S 8/1 shows vertex, normal fluid, no obvious reason for bleeding. U/S 7/28 shows growth at 22%.  Medications:  Scheduled . docusate sodium  100 mg Oral Daily  . labetalol  100 mg Oral BID  . prenatal vitamin w/FE, FA  1 tablet Oral Q1200  . sertraline  50 mg Oral QHS    I have reviewed the patient's current medications.  ASSESSMENT: Principal Problem:   Placental abruption in third trimester Active Problems:   Hypertension   Previous cesarean delivery, antepartum   Chronic hypertension complicating or reason for care during pregnancy   Obesity affecting pregnancy   Syphilis affecting pregnancy, antepartum   Group B Streptococcus carrier, +RV culture, currently pregnant   Poor fetal growth affecting management of mother in third trimester, antepartum   Vaginal bleeding   PLAN: 1.  Placental abruption in third trimester.  Improved bleeding.  Continue to monitor for at least 7 days. Discussed that this was her third admission and she can stay until delivery at 37 weeks as scheduled.  She is interested in going home. Will reevaluate on 10/06/14 if no further bleeding by then.  Delivery for maternal or fetal concern.  2.  Hypertension - Chronic.  Stable BP. Continue labetalol  BID.  3.  Pregnancy  Category 1 FHR tracing  Continue NST q shift.   Tereso Newcomer, MD 10/04/2014,8:25 AM

## 2014-10-04 NOTE — Plan of Care (Signed)
Problem: Consults Goal: Birthing Suites Patient Information Press F2 to bring up selections list  Outcome: Not Applicable Date Met:  80/03/49 Patient is admitted to antenatal unit.  Problem: Phase I Progression Outcomes Goal: LOS < 4 days Outcome: Not Met (add Reason) Patient admitted to antenatal for long term hospitalization.

## 2014-10-05 LAB — TYPE AND SCREEN
ABO/RH(D): A POS
ANTIBODY SCREEN: NEGATIVE

## 2014-10-05 NOTE — Progress Notes (Signed)
FACULTY PRACTICE ANTEPARTUM(COMPREHENSIVE) NOTE  Judy Wood is a 19 y.o. G2P1001 at [redacted]w[redacted]d by early ultrasound who is admitted for  vaginal bleeding in the setting of known chronic placental abruption. This is is her third admission. Fetal presentation is cephalic. Length of Stay:  6  Days  Subjective: No bleeding Patient reports the fetal movement as active. Patient reports uterine contraction  activity as irregular Patient reports  vaginal bleeding as none. Patient describes fluid per vagina as None.  Vitals:  Blood pressure 140/69, pulse 91, temperature 98.4 F (36.9 C), temperature source Oral, resp. rate 18, height  (1.651 m), weight 123.152 kg (271 lb 8 oz), last menstrual period 01/04/2014. Physical Examination:  General appearance - alert, well appearing, and in no distress Heart - normal rate and regular rhythm Abdomen - soft, nontender, nondistended Fundal Height:  size equals dates Cervical Exam: Not evaluated.  Extremities: extremities normal, atraumatic, no cyanosis or edema and Homans sign is negative, no sign of DVT Membranes:intact  Fetal Monitoring:   Fetal Heart Rate A     Mode  External filed at 10/04/2014 2216    Baseline Rate (A)  130 bpm filed at 10/04/2014 2216    Variability  6-25 BPM filed at 10/04/2014 2216    Accelerations  15 x 15 filed at 10/04/2014 2216    Decelerations  None filed at 10/04/2014 2216        Labs:  No results found for this or any previous visit (from the past 24 hour(s)).    Medications:  Scheduled . docusate sodium  100 mg Oral Daily  . labetalol  100 mg Oral BID  . prenatal vitamin w/FE, FA  1 tablet Oral Q1200  . sertraline  50 mg Oral QHS   I have reviewed the patient's current medications.  ASSESSMENT: Patient Active Problem List   Diagnosis Date Noted  . Vaginal bleeding 09/29/2014  . Gonorrhea affecting pregnancy in third trimester 09/29/2014  . Placental abruption in third trimester   . Poor  fetal growth affecting management of mother in third trimester, antepartum   . Group B Streptococcus carrier, +RV culture, currently pregnant 09/01/2014  . Syphilis affecting pregnancy, antepartum 08/31/2014  . Obesity affecting pregnancy   . Chronic hypertension complicating or reason for care during pregnancy 04/22/2014  . Previous cesarean delivery, antepartum 05/23/2012  . Hypertension 10/17/2011    PLAN: She was in agreement to continue hospitalization due to high risk of recurrent bleeding with induction at 37 weeks  Judy Wood 10/05/2014,7:24 AM

## 2014-10-06 ENCOUNTER — Other Ambulatory Visit: Payer: Medicaid Other

## 2014-10-06 MED ORDER — LABETALOL HCL 100 MG PO TABS: 100.0000 mg | ORAL_TABLET | Freq: Two times a day (BID) | ORAL | Status: DC

## 2014-10-06 NOTE — Discharge Instructions (Signed)
°  Vaginal Bleeding During Pregnancy, Third Trimester °A small amount of bleeding (spotting) from the vagina is relatively common in pregnancy. Various things can cause bleeding or spotting in pregnancy. Sometimes the bleeding is normal and is not a problem. However, bleeding during the third trimester can also be a sign of something serious for the mother and the baby. Be sure to tell your health care provider about any vaginal bleeding right away.  °Some possible causes of vaginal bleeding during the third trimester include:  °· The placenta may be partially or completely covering the opening to the cervix (placenta previa).   °· The placenta may have separated from the uterus (abruption of the placenta).   °· There may be an infection or growth on the cervix.   °· You may be starting labor, called discharging of the mucus plug.   °· The placenta may grow into the muscle layer of the uterus (placenta accreta).   °HOME CARE INSTRUCTIONS  °Watch your condition for any changes. The following actions may help to lessen any discomfort you are feeling:  °· Follow your health care provider's instructions for limiting your activity. If your health care provider orders bed rest, you may need to stay in bed and only get up to use the bathroom. However, your health care provider may allow you to continue light activity. °· If needed, make plans for someone to help with your regular activities and responsibilities while you are on bed rest. °· Keep track of the number of pads you use each day, how often you change pads, and how soaked (saturated) they are. Write this down. °· Do not use tampons. Do not douche. °· Do not have sexual intercourse or orgasms until approved by your health care provider. °· Follow your health care provider's advice about lifting, driving, and physical activities. °· If you pass any tissue from your vagina, save the tissue so you can show it to your health care provider.   °· Only take  over-the-counter or prescription medicines as directed by your health care provider. °· Do not take aspirin because it can make you bleed.   °· Keep all follow-up appointments as directed by your health care provider. °SEEK MEDICAL CARE IF: °· You have any vaginal bleeding during any part of your pregnancy. °· You have cramps or labor pains. °· You have a fever, not controlled by medicine. °SEEK IMMEDIATE MEDICAL CARE IF:  °· You have severe cramps or pain in your back or belly (abdomen). °· You have chills. °· You have a gush of fluid from the vagina. °· You pass large clots or tissue from your vagina. °· Your bleeding increases. °· You feel light-headed or weak. °· You pass out. °· You feel less movement or no movement of the baby.   °MAKE SURE YOU: °· Understand these instructions. °· Will watch your condition. °· Will get help right away if you are not doing well or get worse. °Document Released: 05/07/2002 Document Revised: 02/19/2013 Document Reviewed: 10/22/2012 °[employer]® Patient Information ©2015 [employer]. This information is not intended to replace advice given to you by your health care provider. Make sure you discuss any questions you have with your health care provider. ° ° °

## 2014-10-06 NOTE — Discharge Summary (Addendum)
Physician Discharge Summary  Patient ID: Judy Wood MRN: [id number] DOB/AGE: [birth year] 21 y.o.  Admit date: 09/29/2014 Discharge date: 10/06/2014  Admission Diagnoses:  Discharge Diagnoses:  Principal Problem:   Placental abruption in third trimester Active Problems:   Chronic hypertension complicating or reason for care during pregnancy   Hypertension   Previous cesarean delivery, antepartum   Obesity affecting pregnancy   Syphilis affecting pregnancy, antepartum   Group B Streptococcus carrier, +RV culture, currently pregnant   Poor fetal growth affecting management of mother in third trimester, antepartum   Vaginal bleeding   Discharged Condition: stable  Hospital Course: Patient admitted on 09/29/14 due to vaginal bleeding.  This was her third admission during this pregnancy for vaginal bleeding. Since her admission, she has had no further vaginal bleeding. The option was given to her to continue hospitalization until her induction at 37 weeks (10/12/14) versus discharge home, given that she has had no further vaginal bleeding or spotting and her fetal heart tracing has always remained reassuring. Patient opted to return home. Discussed modified bed rest, pelvic rest. Patient scheduled for appointment on 8/11. Patient instructed to follow up sooner if she has any vaginal bleeding.  Additionally, the patient had increased blood pressure on day 2 of admission. Preeclampsia labs were drawn, which were all normal.  Consults: None  Significant Diagnostic Studies: NST: Reactive. Ultrasound negative for acute abruption  Treatments:   Discharge Exam: Blood pressure 148/81, pulse 82, temperature 98.7 F (37.1 C), temperature source Oral, resp. rate 20, height 5\' 5"  (1.651 m), weight 271 lb 8 oz (123.152 kg), last menstrual period 01/04/2014. General appearance: alert, cooperative and no distress Head: Normocephalic, without obvious abnormality, atraumatic Resp: clear to auscultation  bilaterally Cardio: regular rate and rhythm, S1, S2 normal, no murmur, click, rub or gallop GI: soft, non-tender; bowel sounds normal; no masses,  no organomegaly and Gravid. Fundal height equals dates Extremities: extremities normal, atraumatic, no cyanosis or edema Pulses: 2+ and symmetric Skin: Skin color, texture, turgor normal. No rashes or lesions Neurologic: Alert and oriented X 3, normal strength and tone. Normal symmetric reflexes. Normal coordination and gait  Disposition: 01-Home or Self Care  Discharge Instructions    Discharge activity: Bedrest    Complete by:  As directed      Discharge diet:  No restrictions    Complete by:  As directed      Do not have sex or do anything that might make you have an orgasm    Complete by:  As directed      LABOR:  When conractions begin, you should start to time them from the beginning of one contraction to the beginning  of the next.  When contractions are 5 - 10 minutes apart or less and have been regular for at least an hour, you should call your health care provider.    Complete by:  As directed      Notify physician for bleeding from the vagina    Complete by:  As directed      Notify physician for blurring of vision or spots before the eyes    Complete by:  As directed      Notify physician for chills or fever    Complete by:  As directed      Notify physician for fainting spells, "black outs" or loss of consciousness    Complete by:  As directed      Notify physician for increase in vaginal discharge  Complete by:  As directed      Notify physician for leaking of fluid    Complete by:  As directed      Notify physician for pain or burning when urinating    Complete by:  As directed      Notify physician for pelvic pressure (sudden increase)    Complete by:  As directed      Notify physician for severe or continued nausea or vomiting    Complete by:  As directed      Notify physician for sudden gushing of fluid from the  vagina (with or without continued leaking)    Complete by:  As directed      Notify physician for sudden, constant, or occasional abdominal pain    Complete by:  As directed      Notify physician if baby moving less than usual    Complete by:  As directed             Medication List    TAKE these medications        aspirin EC 81 MG tablet  Take 1 tablet (81 mg total) by mouth daily.     labetalol 100 MG tablet  Commonly known as:  NORMODYNE  Take 1 tablet (100 mg total) by mouth 2 (two) times daily.     NIFEdipine 10 MG capsule  Commonly known as:  PROCARDIA  Take 1 tablet with contractions. If contractions persist can take 1 tablet 10 minutes later. Do not exceed 40 mg in 1 hour.     prenatal vitamin w/FE, FA 27-1 MG Tabs tablet  Take 1 tablet by mouth daily at 12 noon.     sertraline 50 MG tablet  Commonly known as:  ZOLOFT  Take 1 tablet (50 mg total) by mouth at bedtime.           Follow-up Information    Follow up with [facility] On 10/09/2014.   Specialty:  Obstetrics and Gynecology   Contact information:   [address] [place] [address] [phone]     Greater than 30 minutes spent on the discharge of this patient including counseling, and organization of outpatient appointments.  SignedCandelaria Celeste JEHIEL 10/06/2014, 10:01 AM

## 2014-10-09 ENCOUNTER — Ambulatory Visit (INDEPENDENT_AMBULATORY_CARE_PROVIDER_SITE_OTHER): Payer: Medicaid Other | Admitting: Family Medicine

## 2014-10-09 ENCOUNTER — Other Ambulatory Visit: Payer: Medicaid Other

## 2014-10-09 VITALS — BP 144/70 | HR 102 | Wt 267.9 lb

## 2014-10-09 DIAGNOSIS — I10 Essential (primary) hypertension: Secondary | ICD-10-CM

## 2014-10-09 DIAGNOSIS — N939 Abnormal uterine and vaginal bleeding, unspecified: Secondary | ICD-10-CM

## 2014-10-09 DIAGNOSIS — O10913 Unspecified pre-existing hypertension complicating pregnancy, third trimester: Secondary | ICD-10-CM

## 2014-10-09 DIAGNOSIS — O0993 Supervision of high risk pregnancy, unspecified, third trimester: Secondary | ICD-10-CM | POA: Diagnosis not present

## 2014-10-09 LAB — POCT URINALYSIS DIP (DEVICE)
Glucose, UA: NEGATIVE mg/dL
HGB URINE DIPSTICK: NEGATIVE
Nitrite: NEGATIVE
PH: 6.5 (ref 5.0–8.0)
PROTEIN: 30 mg/dL — AB
Specific Gravity, Urine: 1.025 (ref 1.005–1.030)
Urobilinogen, UA: 1 mg/dL (ref 0.0–1.0)

## 2014-10-09 NOTE — Progress Notes (Signed)
Subjective:  Judy Wood is a 19 y.o. G2P1001 at [redacted]w[redacted]d being seen today for ongoing prenatal care.  Patient reports no complaints.  Contractions: Not present.  Vag. Bleeding: None. Movement: Present. Denies leaking of fluid.   No further vaginal bleeding.  The following portions of the patient's history were reviewed and updated as appropriate: allergies, current medications, past family history, past medical history, past social history, past surgical history and problem list.   Objective:   Filed Vitals:   10/09/14 1348  BP: 144/70  Pulse: 102  Weight: 267 lb 14.4 oz (121.519 kg)    Fetal Status: Fetal Heart Rate (bpm): NST   Movement: Present     General:  Alert, oriented and cooperative. Patient is in no acute distress.  Skin: Skin is warm and dry. No rash noted.   Cardiovascular: Normal heart rate noted  Respiratory: Normal respiratory effort, no problems with respiration noted  Abdomen: Soft, gravid, appropriate for gestational age. Pain/Pressure: Present     Pelvic: Vag. Bleeding: None     Cervical exam deferred        Extremities: Normal range of motion.  Edema: Trace  Mental Status: Normal mood and affect. Normal behavior. Normal judgment and thought content.   Urinalysis: Urine Protein: 1+ Urine Glucose: Negative  Assessment and Plan:  Pregnancy: G2P1001 at [redacted]w[redacted]d  1. Chronic hypertension complicating or reason for care during pregnancy, third trimester NST reactive - Fetal nonstress test  2. Supervision of high risk pregnancy in third trimester Induce on Sunday at 37 weeks  3. Vaginal bleeding   Term labor symptoms and general obstetric precautions including but not limited to vaginal bleeding, contractions, leaking of fluid and fetal movement were reviewed in detail with the patient. Please refer to After Visit Summary for other counseling recommendations.  Return in about 7 weeks (around 11/24/2014) for PP visit as scheduled.   Levie Heritage, DO

## 2014-10-09 NOTE — Progress Notes (Signed)
Recent hospital stay for vaginal bleeding/abruption.  She reports no bleeding since D/C on 8/8.  She denies H/A or visual disturbances.   IOL scheduled 8/14.

## 2014-10-12 ENCOUNTER — Inpatient Hospital Stay (HOSPITAL_COMMUNITY): Payer: Medicaid Other | Admitting: Anesthesiology

## 2014-10-12 ENCOUNTER — Inpatient Hospital Stay (HOSPITAL_COMMUNITY)
Admission: AD | Admit: 2014-10-12 | Discharge: 2014-10-15 | DRG: 774 | Disposition: A | Discharge status: Home / Self Care | Payer: Medicaid Other | Source: Ambulatory Visit | Attending: Obstetrics & Gynecology | Admitting: Obstetrics & Gynecology

## 2014-10-12 ENCOUNTER — Encounter (HOSPITAL_COMMUNITY): Payer: Self-pay

## 2014-10-12 VITALS — BP 135/79 | HR 68 | Temp 98.2°F | Resp 18 | Ht 65.0 in | Wt 267.0 lb

## 2014-10-12 DIAGNOSIS — O9952 Diseases of the respiratory system complicating childbirth: Secondary | ICD-10-CM | POA: Diagnosis present

## 2014-10-12 DIAGNOSIS — N939 Abnormal uterine and vaginal bleeding, unspecified: Secondary | ICD-10-CM

## 2014-10-12 DIAGNOSIS — O4593 Premature separation of placenta, unspecified, third trimester: Secondary | ICD-10-CM | POA: Diagnosis present

## 2014-10-12 DIAGNOSIS — O99214 Obesity complicating childbirth: Secondary | ICD-10-CM | POA: Diagnosis present

## 2014-10-12 DIAGNOSIS — Z87891 Personal history of nicotine dependence: Secondary | ICD-10-CM | POA: Diagnosis not present

## 2014-10-12 DIAGNOSIS — F1290 Cannabis use, unspecified, uncomplicated: Secondary | ICD-10-CM | POA: Diagnosis present

## 2014-10-12 DIAGNOSIS — O365930 Maternal care for other known or suspected poor fetal growth, third trimester, not applicable or unspecified: Secondary | ICD-10-CM | POA: Diagnosis present

## 2014-10-12 DIAGNOSIS — J45909 Unspecified asthma, uncomplicated: Secondary | ICD-10-CM | POA: Diagnosis present

## 2014-10-12 DIAGNOSIS — O98213 Gonorrhea complicating pregnancy, third trimester: Secondary | ICD-10-CM

## 2014-10-12 DIAGNOSIS — O1092 Unspecified pre-existing hypertension complicating childbirth: Principal | ICD-10-CM | POA: Diagnosis present

## 2014-10-12 DIAGNOSIS — O99824 Streptococcus B carrier state complicating childbirth: Secondary | ICD-10-CM | POA: Diagnosis present

## 2014-10-12 DIAGNOSIS — O99324 Drug use complicating childbirth: Secondary | ICD-10-CM | POA: Diagnosis present

## 2014-10-12 DIAGNOSIS — O3421 Maternal care for scar from previous cesarean delivery: Secondary | ICD-10-CM | POA: Diagnosis present

## 2014-10-12 DIAGNOSIS — O98113 Syphilis complicating pregnancy, third trimester: Secondary | ICD-10-CM

## 2014-10-12 DIAGNOSIS — Z8249 Family history of ischemic heart disease and other diseases of the circulatory system: Secondary | ICD-10-CM | POA: Diagnosis not present

## 2014-10-12 DIAGNOSIS — Z3490 Encounter for supervision of normal pregnancy, unspecified, unspecified trimester: Secondary | ICD-10-CM

## 2014-10-12 DIAGNOSIS — Z882 Allergy status to sulfonamides status: Secondary | ICD-10-CM

## 2014-10-12 DIAGNOSIS — Z6841 Body Mass Index (BMI) 40.0 and over, adult: Secondary | ICD-10-CM

## 2014-10-12 DIAGNOSIS — O133 Gestational [pregnancy-induced] hypertension without significant proteinuria, third trimester: Secondary | ICD-10-CM | POA: Diagnosis not present

## 2014-10-12 DIAGNOSIS — Z3A37 37 weeks gestation of pregnancy: Secondary | ICD-10-CM | POA: Diagnosis present

## 2014-10-12 DIAGNOSIS — Z91018 Allergy to other foods: Secondary | ICD-10-CM

## 2014-10-12 DIAGNOSIS — O0993 Supervision of high risk pregnancy, unspecified, third trimester: Secondary | ICD-10-CM

## 2014-10-12 DIAGNOSIS — O34219 Maternal care for unspecified type scar from previous cesarean delivery: Secondary | ICD-10-CM

## 2014-10-12 DIAGNOSIS — O99820 Streptococcus B carrier state complicating pregnancy: Secondary | ICD-10-CM

## 2014-10-12 LAB — CBC
HEMATOCRIT: 29 % — AB (ref 36.0–46.0)
HEMATOCRIT: 30 % — AB (ref 36.0–46.0)
HEMOGLOBIN: 9.9 g/dL — AB (ref 12.0–15.0)
Hemoglobin: 10.3 g/dL — ABNORMAL LOW (ref 12.0–15.0)
MCH: 29.7 pg (ref 26.0–34.0)
MCH: 29.9 pg (ref 26.0–34.0)
MCHC: 34.1 g/dL (ref 30.0–36.0)
MCHC: 34.3 g/dL (ref 30.0–36.0)
MCV: 87.1 fL (ref 78.0–100.0)
MCV: 87.2 fL (ref 78.0–100.0)
PLATELETS: 246 10*3/uL (ref 150–400)
Platelets: 240 10*3/uL (ref 150–400)
RBC: 3.33 MIL/uL — ABNORMAL LOW (ref 3.87–5.11)
RBC: 3.44 MIL/uL — AB (ref 3.87–5.11)
RDW: 13.4 % (ref 11.5–15.5)
RDW: 13.2 % (ref 11.5–15.5)
WBC: 7.5 10*3/uL (ref 4.0–10.5)
WBC: 10.5 10*3/uL (ref 4.0–10.5)

## 2014-10-12 LAB — TYPE AND SCREEN
ABO/RH(D): A POS
ANTIBODY SCREEN: NEGATIVE

## 2014-10-12 LAB — RPR: RPR: NONREACTIVE

## 2014-10-12 MED ORDER — OXYTOCIN 40 UNITS IN LACTATED RINGERS INFUSION - SIMPLE MED
1.0000 m[IU]/min | INTRAVENOUS | Status: DC
  Administered 2014-10-12: 2 m[IU]/min via INTRAVENOUS
  Filled 2014-10-12: qty 1000

## 2014-10-12 MED ORDER — OXYTOCIN BOLUS FROM INFUSION
500.0000 mL | INTRAVENOUS | Status: DC
  Administered 2014-10-13: 500 mL via INTRAVENOUS

## 2014-10-12 MED ORDER — PROMETHAZINE HCL 25 MG/ML IJ SOLN
12.5000 mg | Freq: Once | INTRAMUSCULAR | Status: AC
  Administered 2014-10-12: 12.5 mg via INTRAVENOUS
  Filled 2014-10-12: qty 1

## 2014-10-12 MED ORDER — EPHEDRINE 5 MG/ML INJ
10.0000 mg | INTRAVENOUS | Status: DC | PRN
  Filled 2014-10-12: qty 2

## 2014-10-12 MED ORDER — LIDOCAINE-EPINEPHRINE (PF) 2 %-1:200000 IJ SOLN
INTRAMUSCULAR | Status: DC | PRN
  Administered 2014-10-12: 4 mL

## 2014-10-12 MED ORDER — ONDANSETRON HCL 4 MG/2ML IJ SOLN: 4.0000 mg | Freq: Four times a day (QID) | INTRAMUSCULAR | Status: DC | PRN

## 2014-10-12 MED ORDER — BUPIVACAINE HCL (PF) 0.25 % IJ SOLN
INTRAMUSCULAR | Status: DC | PRN
  Administered 2014-10-12 (×2): 4 mL

## 2014-10-12 MED ORDER — ACETAMINOPHEN 325 MG PO TABS: 650.0000 mg | ORAL_TABLET | ORAL | Status: DC | PRN

## 2014-10-12 MED ORDER — LACTATED RINGERS IV SOLN
INTRAVENOUS | Status: DC
  Administered 2014-10-12 (×2): via INTRAVENOUS

## 2014-10-12 MED ORDER — FENTANYL 2.5 MCG/ML BUPIVACAINE 1/10 % EPIDURAL INFUSION (WH - ANES)
14.0000 mL/h | INTRAMUSCULAR | Status: DC | PRN
  Administered 2014-10-12: 12 mL/h via EPIDURAL
  Administered 2014-10-13: 14 mL/h via EPIDURAL
  Filled 2014-10-12 (×2): qty 125

## 2014-10-12 MED ORDER — CITRIC ACID-SODIUM CITRATE 334-500 MG/5ML PO SOLN
30.0000 mL | ORAL | Status: DC | PRN
  Filled 2014-10-12: qty 15

## 2014-10-12 MED ORDER — NALBUPHINE HCL 10 MG/ML IJ SOLN
10.0000 mg | INTRAMUSCULAR | Status: DC | PRN
  Administered 2014-10-12: 10 mg via INTRAVENOUS
  Filled 2014-10-12: qty 1

## 2014-10-12 MED ORDER — LIDOCAINE HCL (PF) 1 % IJ SOLN
30.0000 mL | INTRAMUSCULAR | Status: DC | PRN
  Filled 2014-10-12: qty 30

## 2014-10-12 MED ORDER — PHENYLEPHRINE 40 MCG/ML (10ML) SYRINGE FOR IV PUSH (FOR BLOOD PRESSURE SUPPORT)
80.0000 ug | PREFILLED_SYRINGE | INTRAVENOUS | Status: DC | PRN
  Filled 2014-10-12: qty 20
  Filled 2014-10-12: qty 2

## 2014-10-12 MED ORDER — NALBUPHINE HCL 10 MG/ML IJ SOLN
10.0000 mg | Freq: Once | INTRAMUSCULAR | Status: AC
  Administered 2014-10-12: 10 mg via INTRAVENOUS
  Filled 2014-10-12: qty 1

## 2014-10-12 MED ORDER — DIPHENHYDRAMINE HCL 50 MG/ML IJ SOLN: 12.5000 mg | INTRAMUSCULAR | Status: DC | PRN

## 2014-10-12 MED ORDER — LACTATED RINGERS IV SOLN
500.0000 mL | INTRAVENOUS | Status: DC | PRN
  Administered 2014-10-12 – 2014-10-13 (×2): 500 mL via INTRAVENOUS

## 2014-10-12 MED ORDER — OXYTOCIN 40 UNITS IN LACTATED RINGERS INFUSION - SIMPLE MED: 62.5000 mL/h | INTRAVENOUS | Status: DC

## 2014-10-12 MED ORDER — SODIUM CHLORIDE 0.9 % IV SOLN
2.0000 g | Freq: Four times a day (QID) | INTRAVENOUS | Status: DC
Start: 2014-10-12 — End: 2014-10-13
  Administered 2014-10-12 – 2014-10-13 (×2): 2 g via INTRAVENOUS
  Filled 2014-10-12 (×4): qty 2000

## 2014-10-12 MED ORDER — TERBUTALINE SULFATE 1 MG/ML IJ SOLN
0.2500 mg | Freq: Once | INTRAMUSCULAR | Status: DC | PRN
  Filled 2014-10-12: qty 1

## 2014-10-12 MED ORDER — OXYCODONE-ACETAMINOPHEN 5-325 MG PO TABS: 1.0000 | ORAL_TABLET | ORAL | Status: DC | PRN

## 2014-10-12 MED ORDER — OXYCODONE-ACETAMINOPHEN 5-325 MG PO TABS: 2.0000 | ORAL_TABLET | ORAL | Status: DC | PRN

## 2014-10-12 NOTE — Progress Notes (Signed)
This note also relates to the following rows which could not be included: Dose (milli-units/min) Oxytocin - Cannot attach notes to extension rows Rate (mL/hr) Oxytocin - Cannot attach notes to extension rows Ctxs not tracing d/t maternal positon

## 2014-10-12 NOTE — Progress Notes (Signed)
EFM removed by pt, states she's going to the bathroom

## 2014-10-12 NOTE — Progress Notes (Signed)
S:        Patient complaining of pressure from the balloon and "wants balloon out and to go to sleep". Does not want to have a cervical exam at this point    O:  VS: Blood pressure 136/77, pulse 88, temperature 97.6 F (36.4 C), temperature source Oral, resp. rate 20, height  (1.651 m), weight 121.11 kg (267 lb), last menstrual period 01/04/2014.        FHR : baseline 115 / variability moderate / accelerations present / no decelerations        Toco: unable to trace contractions due to maternal position and habitus        Cervix : Foley bulb out at 1830        Membranes: intact  Pitocin at 18 mu A:  G2P1 in early labor     FHR category Cat 1  P: Patient does not want to be checked at this time. Will notify RN when she wants to be checked and discuss pain management at that time.  Collaborating physician [facility] Anticipate NSVD   [name] SNM 10/12/2014, 6:20 PM

## 2014-10-12 NOTE — Progress Notes (Signed)
Labor Progress Note  S: Very comfortable with epidural. Tired and wanting a nap. Toco was not picking up contractions very well. Baby had some variable decels between 20:00 and 21:00. Strip looked good prior to that.   O:  BP 141/66 mmHg  Pulse 89  Temp(Src) 97.6 F (36.4 C) (Oral)  Resp 16  Ht  (1.651 m)  Wt 121.11 kg (267 lb)  BMI 44.43 kg/m2  SpO2 98%  LMP 01/04/2014   2120: AROM with clear fluid  IUPC placed FSE placed  FHR: baseline 120, good variability, + accels, no decels Toco: q2 min with IUPC reading CVE: 7/70/-1   A&P: 19 y.o. G2P1001 [redacted]w[redacted]d IOL 2/2 cHTN and chronic placental abruption  #labor: will continue to augment with pitocin #internals: IUPC and FSE now in place and measuring well  #FWB: Cat I  #cHTN: Last BP 141/66, prior to that had been normotensive since 1938; continue to monitor  #anticipate NSVD    De Hollingshead, DO 9:44 PM

## 2014-10-12 NOTE — Progress Notes (Signed)
EFM reapplied once pt returned back to bed

## 2014-10-12 NOTE — Progress Notes (Signed)
This note also relates to the following rows which could not be included: Dose (milli-units/min) Oxytocin - Cannot attach notes to extension rows Rate (mL/hr) Oxytocin - Cannot attach notes to extension rows Ctxs indeterminate secondary maternal position

## 2014-10-12 NOTE — Progress Notes (Signed)
EFM tracing sketchy  

## 2014-10-12 NOTE — H&P (Signed)
Judy Wood is a 19 y.o. female presenting for IOL for CHTN and placental abruption. Maternal Medical History:  Reason for admission: IOL for HTN and chronic abruption  Fetal activity: Perceived fetal activity is normal.   Last perceived fetal movement was within the past hour.    Prenatal complications: Bleeding, PIH and placental abnormality.     OB History    Gravida Para Term Preterm AB TAB SAB Ectopic Multiple Living   Past Medical History  Diagnosis Date  . Hypertension     on meds  . Hernia, umbilical   . Hypertension   . H/O umbilical hernia repair   . Asthma     pt states she took albuterol inhaler lately  . Syphilis 08/30/2014    Treated   Past Surgical History  Procedure Laterality Date  . Umbilical hernia repair      as a child  . Umbilical hernia repair    . Cesarean section N/A 05/19/2012    Procedure:  Primary CESAREAN SECTION  of baby girl  at 1933  APGAR 4/5/5;  Surgeon: Adam Phenix, MD;  Location: [facility];  Service: Obstetrics;  Laterality: N/A;   Family History: family history includes Anemia in her mother; Cancer in her maternal grandmother; Deep vein thrombosis in her maternal grandmother; Depression in her mother; Diabetes in her father and maternal grandmother; Hypertension in her mother; Kidney disease in her maternal grandmother. Social History:  reports that she quit smoking about 6 weeks ago. Her smoking use included Cigarettes. She smoked 0.25 packs per day. She has never used smokeless tobacco. She reports that she uses illicit drugs (Marijuana and Other-see comments). She reports that she does not drink alcohol.   Prenatal Transfer Tool  Maternal Diabetes: No Genetic Screening: Normal Maternal Ultrasounds/Referrals:  Fetal Ultrasounds or other Referrals:  Dopplers, NST and growth [place] Maternal Substance Abuse:  No Significant Maternal Medications:  Meds include: Other: labetolol Significant Maternal Lab Results:  GBS  positive Other Comments:  Patient states she has not had an asthma attack in 2 years and that her asthma is only bad in the wintertime.  History of syphillis in this pregnancy.   Review of Systems  Constitutional: Negative.   HENT: Negative.   Eyes: Negative.   Respiratory: Negative.   Cardiovascular: Negative.   Gastrointestinal: Negative.   Musculoskeletal: Negative.   Skin: Negative.   Neurological: Negative.   Endo/Heme/Allergies:       Allergy to bananas and sulfa. Itching and swelling with bananas; had reaction to sulfa when she had hernia surgery but does not remember what the reaction was.   Psychiatric/Behavioral: Negative.     Dilation: Fingertip Effacement (%): Thick Station: -2 Exam by:: Marlynn Perking, CNM Blood pressure 146/88, pulse 108, temperature 98.4 F (36.9 C), temperature source Oral, resp. rate 20, height  (1.651 m), weight 121.11 kg (267 lb), last menstrual period 01/04/2014. Maternal Exam:  Uterine Assessment: No contractions  Abdomen: Patient reports no abdominal tenderness. Surgical scars: low transverse.   Estimated fetal weight is 4-6 pounds.   Fetal presentation: vertex  Introitus: Normal vulva. Normal vagina.  Ferning test: not done.  Nitrazine test: not done. Amniotic fluid character: not assessed.  Pelvis: adequate for delivery.   Cervix: Cervix evaluated by digital exam.     Physical Exam  Constitutional: She is oriented to person, place, and time. She appears well-developed.  HENT:  Head:  Normocephalic.  Eyes: Pupils are equal, round, and reactive to light.  Neck: Normal range of motion.  Cardiovascular: Normal rate.   Respiratory: Effort normal and breath sounds normal.  GI: Soft. Normal appearance.  Genitourinary: Vagina normal.  Musculoskeletal: Normal range of motion.  Neurological: She is alert and oriented to person, place, and time.  Skin: Skin is warm, dry and intact.  Psychiatric: She has a normal mood and affect.     Prenatal labs: ABO, Rh: --/--/A POS (08/07 1017) Antibody: NEG (08/07 1017) Rubella: 3.46 (02/23 1135) RPR: Reactive (07/02 1825)  HBsAg: NEGATIVE (02/23 1135)  HIV: NONREACTIVE (02/23 1135)  GBS: Positive (05/31 0000)   Assessment/Plan:G2 P1 here for IOL for CHTN  Placental abruption and poor fetal growth. FHR 135; moderate variability, accelerations present with no decelerations.   Foley bulb placed at 0945; will start pitocin for induction.   No complaints of pain at this time.   [name] 10/12/2014, 9:46 AM

## 2014-10-12 NOTE — Anesthesia Procedure Notes (Signed)
Epidural Patient location during procedure: OB  Staffing Anesthesiologist: Yovana Scogin, CHRIS Performed by: anesthesiologist   Preanesthetic Checklist Completed: patient identified, surgical consent, pre-op evaluation, timeout performed, IV checked, risks and benefits discussed and monitors and equipment checked  Epidural Patient position: sitting Prep: DuraPrep Patient monitoring: heart rate, cardiac monitor, continuous pulse ox and blood pressure Approach: midline Location: L4-L5 Injection technique: LOR saline  Needle:  Needle type: Tuohy  Needle gauge: 17 G Needle length: 9 cm Needle insertion depth: 9 cm Catheter type: closed end flexible Catheter size: 19 Gauge Catheter at skin depth: 15 cm Test dose: negative and 2% lidocaine with Epi 1:200 K  Assessment Events: blood not aspirated, injection not painful, no injection resistance, negative IV test and no paresthesia  Additional Notes Reason for block:procedure for pain   

## 2014-10-12 NOTE — Anesthesia Preprocedure Evaluation (Signed)
Anesthesia Evaluation  Patient identified by MRN, date of birth, ID band Patient awake    Reviewed: Allergy & Precautions, NPO status , Patient's Chart, lab work & pertinent test results, reviewed documented beta blocker date and time   History of Anesthesia Complications Negative for: history of anesthetic complications  Airway Mallampati: III  TM Distance: >3 FB Neck ROM: Full    Dental  (+) Teeth Intact   Pulmonary asthma , former smoker,  breath sounds clear to auscultation        Cardiovascular hypertension, Pt. on medications and Pt. on home beta blockers Rhythm:Regular     Neuro/Psych negative neurological ROS  negative psych ROS   GI/Hepatic negative GI ROS, Neg liver ROS,   Endo/Other  Morbid obesity  Renal/GU negative Renal ROS     Musculoskeletal   Abdominal   Peds  Hematology  (+) anemia ,   Anesthesia Other Findings   Reproductive/Obstetrics (+) Pregnancy                             Anesthesia Physical Anesthesia Plan  ASA: III  Anesthesia Plan: Epidural   Post-op Pain Management:    Induction:   Airway Management Planned:   Additional Equipment:   Intra-op Plan:   Post-operative Plan:   Informed Consent: I have reviewed the patients History and Physical, chart, labs and discussed the procedure including the risks, benefits and alternatives for the proposed anesthesia with the patient or authorized representative who has indicated his/her understanding and acceptance.   Dental advisory given  Plan Discussed with: Anesthesiologist  Anesthesia Plan Comments:         Anesthesia Quick Evaluation

## 2014-10-13 ENCOUNTER — Encounter (HOSPITAL_COMMUNITY): Payer: Self-pay

## 2014-10-13 DIAGNOSIS — O365930 Maternal care for other known or suspected poor fetal growth, third trimester, not applicable or unspecified: Secondary | ICD-10-CM

## 2014-10-13 DIAGNOSIS — O133 Gestational [pregnancy-induced] hypertension without significant proteinuria, third trimester: Secondary | ICD-10-CM

## 2014-10-13 DIAGNOSIS — O4593 Premature separation of placenta, unspecified, third trimester: Secondary | ICD-10-CM

## 2014-10-13 DIAGNOSIS — Z3A37 37 weeks gestation of pregnancy: Secondary | ICD-10-CM

## 2014-10-13 LAB — CBC
HCT: 28.9 % — ABNORMAL LOW (ref 36.0–46.0)
Hemoglobin: 10.1 g/dL — ABNORMAL LOW (ref 12.0–15.0)
MCH: 30.4 pg (ref 26.0–34.0)
MCHC: 34.9 g/dL (ref 30.0–36.0)
MCV: 87 fL (ref 78.0–100.0)
PLATELETS: 221 10*3/uL (ref 150–400)
RBC: 3.32 MIL/uL — ABNORMAL LOW (ref 3.87–5.11)
RDW: 13.1 % (ref 11.5–15.5)
WBC: 15.6 10*3/uL — AB (ref 4.0–10.5)

## 2014-10-13 MED ORDER — SODIUM CHLORIDE 0.9 % IJ SOLN: 3.0000 mL | INTRAMUSCULAR | Status: DC | PRN

## 2014-10-13 MED ORDER — SIMETHICONE 80 MG PO CHEW: 80.0000 mg | CHEWABLE_TABLET | ORAL | Status: DC | PRN

## 2014-10-13 MED ORDER — ACETAMINOPHEN 325 MG PO TABS: 650.0000 mg | ORAL_TABLET | ORAL | Status: DC | PRN

## 2014-10-13 MED ORDER — SENNOSIDES-DOCUSATE SODIUM 8.6-50 MG PO TABS
2.0000 | ORAL_TABLET | ORAL | Status: DC
  Administered 2014-10-13 – 2014-10-15 (×2): 2 via ORAL
  Filled 2014-10-13 (×2): qty 2

## 2014-10-13 MED ORDER — OXYCODONE-ACETAMINOPHEN 5-325 MG PO TABS: 2.0000 | ORAL_TABLET | ORAL | Status: DC | PRN

## 2014-10-13 MED ORDER — LABETALOL HCL 100 MG PO TABS
100.0000 mg | ORAL_TABLET | Freq: Two times a day (BID) | ORAL | Status: DC
  Administered 2014-10-13 – 2014-10-15 (×5): 100 mg via ORAL
  Filled 2014-10-13 (×5): qty 1

## 2014-10-13 MED ORDER — WITCH HAZEL-GLYCERIN EX PADS: 1.0000 "application " | MEDICATED_PAD | CUTANEOUS | Status: DC | PRN

## 2014-10-13 MED ORDER — ONDANSETRON HCL 4 MG PO TABS: 4.0000 mg | ORAL_TABLET | ORAL | Status: DC | PRN

## 2014-10-13 MED ORDER — DIBUCAINE 1 % RE OINT: 1.0000 "application " | TOPICAL_OINTMENT | RECTAL | Status: DC | PRN

## 2014-10-13 MED ORDER — OXYCODONE-ACETAMINOPHEN 5-325 MG PO TABS: 1.0000 | ORAL_TABLET | ORAL | Status: DC | PRN

## 2014-10-13 MED ORDER — IBUPROFEN 600 MG PO TABS
600.0000 mg | ORAL_TABLET | Freq: Four times a day (QID) | ORAL | Status: DC
  Administered 2014-10-13 – 2014-10-15 (×9): 600 mg via ORAL
  Filled 2014-10-13 (×10): qty 1

## 2014-10-13 MED ORDER — ONDANSETRON HCL 4 MG/2ML IJ SOLN: 4.0000 mg | INTRAMUSCULAR | Status: DC | PRN

## 2014-10-13 MED ORDER — SODIUM CHLORIDE 0.9 % IV SOLN: 250.0000 mL | INTRAVENOUS | Status: DC | PRN

## 2014-10-13 MED ORDER — DIPHENHYDRAMINE HCL 25 MG PO CAPS: 25.0000 mg | ORAL_CAPSULE | Freq: Four times a day (QID) | ORAL | Status: DC | PRN

## 2014-10-13 MED ORDER — OXYTOCIN 40 UNITS IN LACTATED RINGERS INFUSION - SIMPLE MED: 62.5000 mL/h | INTRAVENOUS | Status: DC | PRN

## 2014-10-13 MED ORDER — BENZOCAINE-MENTHOL 20-0.5 % EX AERO
1.0000 "application " | INHALATION_SPRAY | CUTANEOUS | Status: DC | PRN
  Filled 2014-10-13: qty 56

## 2014-10-13 MED ORDER — SODIUM CHLORIDE 0.9 % IJ SOLN
3.0000 mL | Freq: Two times a day (BID) | INTRAMUSCULAR | Status: DC
  Administered 2014-10-13: 3 mL via INTRAVENOUS

## 2014-10-13 MED ORDER — PNEUMOCOCCAL VAC POLYVALENT 25 MCG/0.5ML IJ INJ
0.5000 mL | INJECTION | INTRAMUSCULAR | Status: AC
  Administered 2014-10-15: 0.5 mL via INTRAMUSCULAR
  Filled 2014-10-13 (×2): qty 0.5

## 2014-10-13 MED ORDER — ZOLPIDEM TARTRATE 5 MG PO TABS: 5.0000 mg | ORAL_TABLET | Freq: Every evening | ORAL | Status: DC | PRN

## 2014-10-13 MED ORDER — LANOLIN HYDROUS EX OINT: TOPICAL_OINTMENT | CUTANEOUS | Status: DC | PRN

## 2014-10-13 MED ORDER — SERTRALINE HCL 50 MG PO TABS
50.0000 mg | ORAL_TABLET | Freq: Every day | ORAL | Status: DC
  Administered 2014-10-13 – 2014-10-14 (×2): 50 mg via ORAL
  Filled 2014-10-13 (×3): qty 1

## 2014-10-13 MED ORDER — TETANUS-DIPHTH-ACELL PERTUSSIS 5-2.5-18.5 LF-MCG/0.5 IM SUSP: 0.5000 mL | Freq: Once | INTRAMUSCULAR | Status: DC

## 2014-10-13 MED ORDER — PRENATAL MULTIVITAMIN CH
1.0000 | ORAL_TABLET | Freq: Every day | ORAL | Status: DC
  Administered 2014-10-13 – 2014-10-15 (×3): 1 via ORAL
  Filled 2014-10-13 (×3): qty 1

## 2014-10-13 NOTE — Lactation Note (Signed)
This note was copied from the chart of Judy Wood. Lactation Consultation Note  Patient Name: Judy Dore Oquin [id number] Date: 10/13/2014 Reason for consult: Initial assessment;Infant < 6lbs Mom reports baby sleepy at the breast. She has started to supplement. Discussed early term baby behaviors and how they can act like LPT infants. Gave Mom LPT hand out for Mom to review. Baby asleep at this visit, so did not see latch, baby recently had formula. LC encouraged Mom to BF with feeding ques but that baby may not give feeding ques well so if she has not observed feeding ques by 3 hours from last feeding to place baby STS and see if he will latch. Encouraged to follow LPT policy with this baby. Encouraged to BF with each feeding 15 minutes each breast when possible, then supplement per LPT guidelines. Set up DEBP for Mom to use to post pump to encourage milk production. Advised to post pump every 3 hours for 15 minutes on preemie setting. Discussed possible finger feeding to give supplement via curved tipped syringe instead of bottles.  Mom will consider and ask for assist if she decides to use this method. Lactation brochure left for review, advised of OP services and support group. Encouraged to call for assist as needed with feedings.   Maternal Data Has patient been taught Hand Expression?: Yes Does the patient have breastfeeding experience prior to this delivery?: Yes  Feeding Feeding Type: Bottle Fed - Formula Length of feed: 5 min  LATCH Score/Interventions                      Lactation Tools Discussed/Used Tools: Pump Breast pump type: Double-Electric Breast Pump WIC Program: Yes Pump Review: Setup, frequency, and cleaning;Milk Storage Initiated by:: KG Date initiated:: 10/13/14   Consult Status Consult Status: Follow-up Date: 10/14/14 Follow-up type: In-patient    Alfred Levins 10/13/2014, 5:21 PM

## 2014-10-13 NOTE — Progress Notes (Signed)
Labor Progress Note  S: Having some repetitive late vs. Variable decelerations down to 90 bpm. Slightly resolved with positional changes. Good variability throughout and good return to baseline with + accels.   O:  BP 115/72 mmHg  Pulse 101  Temp(Src) 97.6 F (36.4 C) (Axillary)  Resp 18  Ht  (1.651 m)  Wt 121.11 kg (267 lb)  BMI 44.43 kg/m2  SpO2 98%  LMP 01/04/2014   Cervix unchanged from previous exam: 7/70/-1  A&P: 18 y.o. G2P1001 [redacted]w[redacted]d IOL 2/2 cHTH and chronic placental abruption #Discussed strip with RN. Will place patient on O2 and do pitocin break for couple hours. Continue to monitor strip closely.  #FWB: Cat II  #cHTN: BP have been stable   De Hollingshead, DO 1:05 AM

## 2014-10-13 NOTE — Anesthesia Postprocedure Evaluation (Signed)
  Anesthesia Post-op Note  Patient: Judy Wood  Procedure(s) Performed: * No procedures listed *  Patient Location: Mother/Baby  Anesthesia Type:Epidural  Level of Consciousness: awake  Airway and Oxygen Therapy: Patient Spontanous Breathing  Post-op Pain: mild  Post-op Assessment: Patient's Cardiovascular Status Stable and Respiratory Function Stable              Post-op Vital Signs: stable  Last Vitals:  Filed Vitals:   10/13/14 0740  BP: 138/74  Pulse:   Temp: 36.9 C  Resp: 18    Complications: No apparent anesthesia complications

## 2014-10-14 LAB — GC/CHLAMYDIA PROBE AMP (~~LOC~~) NOT AT ARMC
Chlamydia: NEGATIVE
NEISSERIA GONORRHEA: NEGATIVE

## 2014-10-14 NOTE — Lactation Note (Signed)
This note was copied from the chart of Boy Linley Bergeman. Lactation Consultation Note  Patient Name: Boy Morrison Masser [id number] Date: 10/14/2014 Reason for consult: Follow-up assessment;Infant < 6lbs Mom is breast/bottle feeding. She is not pumping consistently or offering the breast with each feeding. Reviewed supply/demand and importance of breast being stimulated by BF or BF/pumping to encourage milk production, prevent engorgement and protect milk supply. Assisted Mom to obtain more depth with latch. Mom reports some nipple tenderness, no breakdown noted. Advised to apply EBM. Reviewed Early Term baby behaviors and need to stimulate to keep awake at the breast. Encouraged Mom to call for assist as needed.   Maternal Data    Feeding Feeding Type: Breast Fed  LATCH Score/Interventions Latch: Grasps breast easily, tongue down, lips flanged, rhythmical sucking. Intervention(s): Adjust position;Assist with latch;Breast massage;Breast compression  Audible Swallowing: Spontaneous and intermittent  Type of Nipple: Everted at rest and after stimulation  Comfort (Breast/Nipple): Filling, red/small blisters or bruises, mild/mod discomfort  Problem noted: Mild/Moderate discomfort Interventions (Mild/moderate discomfort): Hand massage;Hand expression  Hold (Positioning): Assistance needed to correctly position infant at breast and maintain latch. Intervention(s): Breastfeeding basics reviewed;Support Pillows;Position options;Skin to skin  LATCH Score: 8  Lactation Tools Discussed/Used     Consult Status Consult Status: Follow-up Date: 10/15/14 Follow-up type: In-patient    Alfred Levins 10/14/2014, 8:55 PM

## 2014-10-14 NOTE — Clinical Social Work Maternal (Signed)
CLINICAL SOCIAL WORK MATERNAL/CHILD NOTE  Patient Details  Name: Judy Wood MRN: [id number] Date of Birth: 03/16/1995  Date:  Jul 20, 2014  Clinical Social Worker Initiating Note:  Loleta Books, LCSW Date/ Time Initiated:  10/14/14/1030     Child's Name:  Judy Wood    Legal Guardian:  Mother   Need for Interpreter:  None   Date of Referral:  Jun 04, 2014     Reason for Referral:  History of depression, previously seen by CSW during pregnancy on antenatal unit  Referral Source:  [facility]   Address:  [address] [place], [place] [address]  Phone number:  [phone]   Household Members:  Parents, Siblings , Minor children  Natural Supports (not living in the home):  Immediate Family   Professional Supports: None   Employment: Unemployed   Type of Work:     Education:      Architect:  [employer]   Other Resources:  Sales executive , [employer]   Cultural/Religious Considerations Which May Impact Care:  None reported  Strengths:  Home prepared for child , Ability to meet basic needs    Risk Factors/Current Problems:   1)Mental Health Concerns: MOB is currently prescribed Zoloft due to feelings of depression during the pregnancy.  2) Psychosocial stressors: FOB is currently incarcerated. MOB has previously identified this as a stressor. He is planned to be released in December, per MOB's report.  Cognitive State:  Able to Concentrate , Alert , Goal Oriented , Linear Thinking    Mood/Affect:  Bright , Calm , Comfortable    CSW Assessment:  MOB is known to CSW department as she has previously been seen while admitted to the antenatal unit during the pregnancy.  CSW completed chart review and consulted with antenatal CSW, C.Shaw regarding MOB's case.    CSW introduced self, and MOB presented as easily engaged and receptive to the visit. She was noted to be  In a pleasant mood and displayed a full range in affect. MOB provided consent for her mother to  remain in the room during the visit. MOB did not present with any acute mental health symptoms, but presents with motivation to address her mental health needs postpartum.   Per MOB, she currently lives with her mother and other family members. She discussed how the home is prepared and how she feels supported by her family as she transitions postpartum. MOB stated that she currently feels "great", and acknowledged that this is significantly different than how she has felt during the pregnancy. MOB confirmed that she had a "lot going on during the pregnancy", and discussed feelings of happiness since she is no longer pregnant. She flected upon the emotional difficulties she experienced during the pregnancy due to frequent admissions for physical health complications.  She stated that it is a huge sense of relief since she is no longer pregnant, and she no longer has to cope with frequent admissions to hospital.  MOB reported that the FOB is currently incarcerated, but will be released in December. She discussed looking forward to his release in order to have his support.   MOB stated that she also believes that being started on Zoloft while on the antenatal unit has assisted to improve mood.  MOB also reflected upon cognitive techniques that she has utilized during the pregnancy to assist her to cope with feelings of anger, sadness, and frustration since she is attempting to "forgive" and "move on".  MOB declined offer to process what and who she  is trying to forgive, but stated that she continues to journal and process her feelings by writing them down.    Per MOB, she plans to continue to take Zoloft as prescribed and acknowledged that she is currently at risk for mental health needs.  MOB has previously received a referral for therapy by C.Clelia Croft at [facility], but there is a 2 month wait list. C.Shaw, was unable to update MOB on the status of her referral.  This CSW provided update to MOB, and MOB  requested new referral since she would prefer to start therapy more quickly.  MOB's comment surrounding her mental health demonstrate insight and motivation to address her needs postpartum.   MOB expressed appreciation for the visit, and agreed to contact CSW if needs arise during the admission.   CSW Plan/Description:   1)Patient/Family Education: Perinatal mood and anxiety disorders 2)Information/Referral to [facility]: Outpatient mental health resources 3)No Further Intervention Required/No Barriers to Discharge    Kelby Fam 11-22-14, 3:40 PM

## 2014-10-14 NOTE — Progress Notes (Signed)
Post Partum Day #1  Subjective:  Judy Wood is a 19 y.o. [id number] [redacted]w[redacted]d s/p successful VBAC after IOL for chronic placental abruption, cHTN preceding pregnancy and IUFG restriction in context of positive gonorrhea and syphilis in third trimester, s/p treatment. Patient had acute events overnight and overall feels she is doing well. Pt denies problems with ambulating, voiding or po intake.  Reports "feeling hungry" and denies nausea or vomiting.  Pain is well controlled.  She has had flatus. She has not had bowel movement.  Lochia Minimal.  Plan for birth control is IUD.  Method of Feeding: Breast and bottle (supplementing with formula)  Objective: Blood pressure 132/90, pulse 65, temperature 97.5 F (36.4 C), temperature source Oral, resp. rate 19, height  (1.651 m), weight 121.11 kg (267 lb), last menstrual period 01/04/2014, SpO2 99 %, unknown if currently breastfeeding.  Physical Exam:  General: alert, cooperative and no distress, sitting up comfortably in bed. Lochia: normal flow Chest: Normal work of breathing Heart: RRR no murmur Abdomen: soft, nontender  Uterine Fundus: firm, fundus palpated just below umbilicus DVT Evaluation: No evidence of DVT seen on physical exam. Extremities: No pitting edema   Recent Labs  10/12/14 1859 10/13/14 0354  HGB 10.3* 10.1*  HCT 30.0* 28.9*    Assessment/Plan:  ASSESSMENT: Judy Wood is a 19 y.o. [id number] [redacted]w[redacted]d s/p succsessful VBAC, overall doing well.  Awaiting gonorrhea TOC culture results.  Continue to monitor overnight.  Social Work consult   LOS: 2 days   Retta Mac 10/14/2014, 11:13 AM   CNM attestation Post Partum Day #1 . Judy Wood is a 19 y.o. [id number] s/p VBAC.  Pt denies problems with ambulating, voiding or po intake. Pain is well controlled.  Plan for birth control is IUD.  Method of Feeding: breast and bottle.  PE:  BP 135/79 mmHg  Pulse 68  Temp(Src) 98.2 F (36.8 C) (Oral)  Resp 18  Ht   (1.651 m)  Wt 121.11 kg (267 lb)  BMI 44.43 kg/m2  SpO2 99%  LMP 01/04/2014  Breastfeeding? Unknown Fundus firm  Plan for discharge: 10/15/14  Cam Hai, CNM 1:12 AM  10/16/2014

## 2014-10-15 ENCOUNTER — Telehealth: Payer: Self-pay | Admitting: Obstetrics and Gynecology

## 2014-10-15 MED ORDER — SERTRALINE HCL 50 MG PO TABS: 50.0000 mg | ORAL_TABLET | Freq: Every day | ORAL | Status: DC

## 2014-10-15 MED ORDER — ACETAMINOPHEN 325 MG PO TABS: 650.0000 mg | ORAL_TABLET | ORAL | Status: DC | PRN

## 2014-10-15 NOTE — Lactation Note (Signed)
This note was copied from the chart of Judy Brownie Kimmel. Lactation Consultation Note; Mom has given some bottles of formula. Mom reports she has been pumping q 3 hours since she got DEBP- reports she obtained 20 cc's at last pumping and bottle fed that to him. Encouraged to hand express so baby can get a taste of milk- baby fussy at the breast, then latched well- swallows noted. Mom reports no pain with latch. Nursing on second breast when I left room. Reviewed engorgement prevention and treatment. Has manual pump and plans to call WIC about DEBP. No questions at present. To call prn  Patient Name: Judy Wood [id number] Date: 10/15/2014 Reason for consult: Follow-up assessment   Maternal Data Formula Feeding for Exclusion: Yes Reason for exclusion: Mother's choice to formula and breast feed on admission Has patient been taught Hand Expression?: Yes Does the patient have breastfeeding experience prior to this delivery?: Yes  Feeding Feeding Type: Breast Fed  LATCH Score/Interventions Latch: Grasps breast easily, tongue down, lips flanged, rhythmical sucking.  Audible Swallowing: A few with stimulation  Type of Nipple: Everted at rest and after stimulation  Comfort (Breast/Nipple): Soft / non-tender     Hold (Positioning): Assistance needed to correctly position infant at breast and maintain latch. Intervention(s): Breastfeeding basics reviewed  LATCH Score: 8  Lactation Tools Discussed/Used WIC Program: Yes   Consult Status Consult Status: Complete    Pamelia Hoit 10/15/2014, 10:10 AM

## 2014-10-15 NOTE — Discharge Summary (Signed)
Obstetric Discharge Summary Reason for Admission: induction of labor 2/2 chronic placental abruption, IUFG restriction in context of cHTN and third trimester infection (gonorrhea and syphilis) Prenatal Procedures: NST and ultrasound Intrapartum Procedures: spontaneous vaginal delivery and GBS prophylaxis Postpartum Procedures: none Complications-Operative and Postpartum: 1st degree perineal laceration  Delivery Note At 2:31 AM a viable female was delivered via Vaginal, Spontaneous Delivery (Presentation:vertex ; LOA ). APGAR: 9, 9; weight .  Placenta status: Intact, Pathology Spontaneous. Cord:3vc with the following complications:none Cord pH: n/a  Anesthesia: Epidural  Episiotomy: None Lacerations: 1st degree;Labial Suture Repair: 2.0 vicryl Est. Blood Loss50 (mL):   Mom to postpartum. Baby to Couplet care / Skin to Skin.  Judy Wood 10/13/2014, 2:46 AM   Hospital Course: Active Problems:   Pregnant  Judy Wood is a 19 y.o. [id number] s/p IOL and SVD of viable early term female.  Patient was admitted multiple times during this pregnancy for LOF and bleeding, presumed to be PPROM and chronic placental abruption.  Pregnancy was additionally complicated by [facility] and third trimester infection (gonorrhea and syphilis).  Pt was able to carry pregnancy to 37 weeks and IOL performed due to complicated prenatal course and IUFG restriction suggesting risk of placental insufficiency later in pregnancy.    Her postpartum course has been uncomplicated, including no problems walking, no N/V or problems with PO intake, and no issues with urination or excessive vaginal bleeding.  States bleeding is currently "like a normal period," not passing clots.  cHTN has been controlled with labetalol 100mg  BID (pt reports this is what she was on before). We discussed that this is not an ideal BP medication, but patient wishes to remain on it for the time being. Will consider switching to another  medication with outpatient f/u.  Pain well controlled, and patient feels ready to go home. Will be discharged with outpatient follow-up.    Today: No acute events overnight.  Pt denies problems with ambulating, voiding or po intake.  She denies nausea or vomiting.  Pain is well controlled.  She has had flatus. She has not had bowel movement.  Lochia Minimal.  Plan for birth control is  IUD.  Method of Feeding: Breast with bottle supplement  Physical Exam:  Filed Vitals:   10/15/14 0949  BP: 135/79  Pulse: 68  Temp:   Resp:    General: alert, cooperative and no distress Lochia: appropriate Uterine Fundus: firm Incision: N/A DVT Evaluation: No evidence of DVT seen on physical exam. No cords or calf tenderness.  H/H: Lab Results  Component Value Date/Time   HGB 10.1* 10/13/2014 03:54 AM   HGB 12.2 10/12/2011   HCT 28.9* 10/13/2014 03:54 AM   HCT 36 10/12/2011    Discharge Diagnoses: Term Pregnancy-delivered  Discharge Information: Date: 10/15/2014 Activity: pelvic rest Diet: routine  Medications: Tylenol #3, Ibuprofen and Iron Breast feeding:  Yes Condition: stable Instructions: refer to handout Discharge to: home       Discharge Instructions    Call MD for:  difficulty breathing, headache or visual disturbances    Complete by:  As directed      Call MD for:  extreme fatigue    Complete by:  As directed      Call MD for:  hives    Complete by:  As directed      Call MD for:  persistant dizziness or light-headedness    Complete by:  As directed      Call MD for:  persistant nausea  and vomiting    Complete by:  As directed      Call MD for:  severe uncontrolled pain    Complete by:  As directed      Call MD for:  temperature >100.4    Complete by:  As directed      Call MD for:    Complete by:  As directed      Diet general    Complete by:  As directed      Sexual acrtivity    Complete by:  As directed   Nothing in the vagina for at least 2 weeks             Medication List    STOP taking these medications        aspirin EC 81 MG tablet     NIFEdipine 10 MG capsule  Commonly known as:  PROCARDIA      TAKE these medications        acetaminophen 325 MG tablet  Commonly known as:  TYLENOL  Take 2 tablets (650 mg total) by mouth every 4 (four) hours as needed (for pain scale < 4).     labetalol 100 MG tablet  Commonly known as:  NORMODYNE  Take 1 tablet (100 mg total) by mouth 2 (two) times daily.     prenatal vitamin w/FE, FA 27-1 MG Tabs tablet  Take 1 tablet by mouth daily at 12 noon.     sertraline 50 MG tablet  Commonly known as:  ZOLOFT  Take 1 tablet (50 mg total) by mouth at bedtime.       Follow-up Information    Follow up with [facility], [place] In 1 week.   Specialty:  Obstetrics and Gynecology   Contact information:   [address] [place] [address] [phone]     The above information was documented with assistance from Retta Mac, MS3.

## 2014-10-16 NOTE — Telephone Encounter (Signed)
error 

## 2014-11-24 ENCOUNTER — Ambulatory Visit (INDEPENDENT_AMBULATORY_CARE_PROVIDER_SITE_OTHER): Payer: Medicaid Other | Admitting: Medical

## 2014-11-24 ENCOUNTER — Encounter: Payer: Self-pay | Admitting: Medical

## 2014-11-24 VITALS — BP 140/82 | HR 86 | Ht 64.0 in | Wt 270.3 lb

## 2014-11-24 DIAGNOSIS — Z8619 Personal history of other infectious and parasitic diseases: Secondary | ICD-10-CM | POA: Insufficient documentation

## 2014-11-24 DIAGNOSIS — Z01812 Encounter for preprocedural laboratory examination: Secondary | ICD-10-CM | POA: Diagnosis not present

## 2014-11-24 DIAGNOSIS — Z30018 Encounter for initial prescription of other contraceptives: Secondary | ICD-10-CM | POA: Diagnosis not present

## 2014-11-24 DIAGNOSIS — A528 Late syphilis, latent: Secondary | ICD-10-CM | POA: Insufficient documentation

## 2014-11-24 DIAGNOSIS — Z8759 Personal history of other complications of pregnancy, childbirth and the puerperium: Secondary | ICD-10-CM | POA: Insufficient documentation

## 2014-11-24 DIAGNOSIS — A549 Gonococcal infection, unspecified: Secondary | ICD-10-CM | POA: Insufficient documentation

## 2014-11-24 DIAGNOSIS — I10 Essential (primary) hypertension: Secondary | ICD-10-CM

## 2014-11-24 DIAGNOSIS — E669 Obesity, unspecified: Secondary | ICD-10-CM | POA: Insufficient documentation

## 2014-11-24 DIAGNOSIS — Z3202 Encounter for pregnancy test, result negative: Secondary | ICD-10-CM | POA: Diagnosis not present

## 2014-11-24 DIAGNOSIS — Z98891 History of uterine scar from previous surgery: Secondary | ICD-10-CM | POA: Insufficient documentation

## 2014-11-24 DIAGNOSIS — O99210 Obesity complicating pregnancy, unspecified trimester: Secondary | ICD-10-CM | POA: Insufficient documentation

## 2014-11-24 LAB — POCT PREGNANCY, URINE: PREG TEST UR: NEGATIVE

## 2014-11-24 MED ORDER — ETONOGESTREL 68 MG ~~LOC~~ IMPL
68.0000 mg | DRUG_IMPLANT | Freq: Once | SUBCUTANEOUS | Status: AC
  Administered 2014-11-24: 68 mg via SUBCUTANEOUS

## 2014-11-24 NOTE — Progress Notes (Signed)
Subjective:     Judy Wood is a 19 y.o. female who presents for a postpartum visit. She is 5 weeks postpartum following a spontaneous vaginal delivery. I have fully reviewed the prenatal and intrapartum course. The delivery was at 37 gestational weeks. Outcome: spontaneous vaginal delivery. Anesthesia: epidural. Postpartum course has been normal. Baby's course has been normal. Baby is feeding by bottle - Similac Advance. Bleeding no bleeding. Bowel function is normal. Bladder function is normal. Patient is not sexually active. Contraception method is Nexplanon. Postpartum depression screening: negative.  The following portions of the patient's history were reviewed and updated as appropriate: allergies, current medications, past family history, past medical history, past social history, past surgical history and problem list.  Review of Systems Pertinent items are noted in HPI.   Objective:    BP 140/82 mmHg  Pulse 86  Ht  (1.626 m)  Wt 270 lb 4.8 oz (122.607 kg)  BMI 46.37 kg/m2  General:  alert and cooperative   Breasts:  not performed  Lungs: normal effort  Heart:  normal rate  Abdomen: soft, nontender   Vulva:  normal  Vagina: not evaluated  Cervix:  not evaluated  Corpus: not examined  Adnexa:  not evaluated  Rectal Exam: Not performed.         GYNECOLOGY CLINIC PROCEDURE NOTE  Nexplanon Insertion Procedure Patient was given informed consent, she signed consent form.  Patient does understand that irregular bleeding is a very common side effect of this medication. She was advised to have backup contraception for one week after placement. Pregnancy test in clinic today was negative.  Appropriate time out taken.  Patient's left arm was prepped in the usual sterile fashion.. The ruler used to measure and mark insertion area.  Patient was prepped with alcohol swab and then injected with 3 ml of 1% lidocaine.  She was prepped with betadine, Nexplanon removed from packaging,   Device confirmed in needle, then inserted full length of needle and withdrawn per handbook instructions. Nexplanon was able to palpated in the patient's arm; patient palpated the insert herself. There was minimal blood loss.  Patient insertion site covered with guaze and a pressure bandage to reduce any bruising.  The patient tolerated the procedure well and was given post procedure instructions.    Assessment:     Normal postpartum exam. Pap smear not done at today's visit.  Patient will have pap smear at age 1 years CHTN  Plan:    1. Contraception: Nexplanon 2. Contact information for [facility] given to patient. Patient advised to schedule an appointment for management of CHTN now that she is no longer pregnant 3. Patient advised to continue Labetalol as previously prescribed until follow-up with [facility] 4.  Follow up in: 1 year for annual exam or sooner as needed.

## 2014-11-24 NOTE — Patient Instructions (Signed)
Etonogestrel implant What is this medicine? ETONOGESTREL (et oh noe JES trel) is a contraceptive (birth control) device. It is used to prevent pregnancy. It can be used for up to 3 years. This medicine may be used for other purposes; ask your health care provider or pharmacist if you have questions. COMMON BRAND NAME(S): Implanon, Nexplanon What should I tell my health care provider before I take this medicine? They need to know if you have any of these conditions: -abnormal vaginal bleeding -blood vessel disease or blood clots -cancer of the breast, cervix, or liver -depression -diabetes -gallbladder disease -headaches -heart disease or recent heart attack -high blood pressure -high cholesterol -kidney disease -liver disease -renal disease -seizures -tobacco smoker -an unusual or allergic reaction to etonogestrel, other hormones, anesthetics or antiseptics, medicines, foods, dyes, or preservatives -pregnant or trying to get pregnant -breast-feeding How should I use this medicine? This device is inserted just under the skin on the inner side of your upper arm by a health care professional. Talk to your pediatrician regarding the use of this medicine in children. Special care may be needed. Overdosage: If you think you've taken too much of this medicine contact a poison control center or emergency room at once. Overdosage: If you think you have taken too much of this medicine contact a poison control center or emergency room at once. NOTE: This medicine is only for you. Do not share this medicine with others. What if I miss a dose? This does not apply. What may interact with this medicine? Do not take this medicine with any of the following medications: -amprenavir -bosentan -fosamprenavir This medicine may also interact with the following medications: -barbiturate medicines for inducing sleep or treating seizures -certain medicines for fungal infections like ketoconazole and  itraconazole -griseofulvin -medicines to treat seizures like carbamazepine, felbamate, oxcarbazepine, phenytoin, topiramate -modafinil -phenylbutazone -rifampin -some medicines to treat HIV infection like atazanavir, indinavir, lopinavir, nelfinavir, tipranavir, ritonavir -St. John's wort This list may not describe all possible interactions. Give your health care provider a list of all the medicines, herbs, non-prescription drugs, or dietary supplements you use. Also tell them if you smoke, drink alcohol, or use illegal drugs. Some items may interact with your medicine. What should I watch for while using this medicine? This product does not protect you against HIV infection (AIDS) or other sexually transmitted diseases. You should be able to feel the implant by pressing your fingertips over the skin where it was inserted. Tell your doctor if you cannot feel the implant. What side effects may I notice from receiving this medicine? Side effects that you should report to your doctor or health care professional as soon as possible: -allergic reactions like skin rash, itching or hives, swelling of the face, lips, or tongue -breast lumps -changes in vision -confusion, trouble speaking or understanding -dark urine -depressed mood -general ill feeling or flu-like symptoms -light-colored stools -loss of appetite, nausea -right upper belly pain -severe headaches -severe pain, swelling, or tenderness in the abdomen -shortness of breath, chest pain, swelling in a leg -signs of pregnancy -sudden numbness or weakness of the face, arm or leg -trouble walking, dizziness, loss of balance or coordination -unusual vaginal bleeding, discharge -unusually weak or tired -yellowing of the eyes or skin Side effects that usually do not require medical attention (Report these to your doctor or health care professional if they continue or are bothersome.): -acne -breast pain -changes in  weight -cough -fever or chills -headache -irregular menstrual bleeding -itching, burning, and   vaginal discharge -pain or difficulty passing urine -sore throat This list may not describe all possible side effects. Call your doctor for medical advice about side effects. You may report side effects to FDA at 1-800-FDA-1088. Where should I keep my medicine? This drug is given in a hospital or clinic and will not be stored at home. NOTE: This sheet is a summary. It may not cover all possible information. If you have questions about this medicine, talk to your doctor, pharmacist, or health care provider.  2015, [employer]. (2011-08-22 15:37:45)  

## 2015-01-11 ENCOUNTER — Emergency Department (HOSPITAL_COMMUNITY)
Admission: EM | Admit: 2015-01-11 | Discharge: 2015-01-11 | Disposition: A | Discharge status: Home / Self Care | Payer: Medicaid Other | Attending: Emergency Medicine | Admitting: Emergency Medicine

## 2015-01-11 ENCOUNTER — Encounter (HOSPITAL_COMMUNITY): Payer: Self-pay | Admitting: Emergency Medicine

## 2015-01-11 DIAGNOSIS — Z87891 Personal history of nicotine dependence: Secondary | ICD-10-CM | POA: Diagnosis not present

## 2015-01-11 DIAGNOSIS — Z3202 Encounter for pregnancy test, result negative: Secondary | ICD-10-CM | POA: Insufficient documentation

## 2015-01-11 DIAGNOSIS — I10 Essential (primary) hypertension: Secondary | ICD-10-CM | POA: Insufficient documentation

## 2015-01-11 DIAGNOSIS — M62830 Muscle spasm of back: Secondary | ICD-10-CM

## 2015-01-11 DIAGNOSIS — J45909 Unspecified asthma, uncomplicated: Secondary | ICD-10-CM | POA: Diagnosis not present

## 2015-01-11 DIAGNOSIS — Z202 Contact with and (suspected) exposure to infections with a predominantly sexual mode of transmission: Secondary | ICD-10-CM | POA: Insufficient documentation

## 2015-01-11 DIAGNOSIS — Z79899 Other long term (current) drug therapy: Secondary | ICD-10-CM | POA: Insufficient documentation

## 2015-01-11 DIAGNOSIS — M545 Low back pain, unspecified: Secondary | ICD-10-CM

## 2015-01-11 LAB — URINALYSIS, ROUTINE W REFLEX MICROSCOPIC
BILIRUBIN URINE: NEGATIVE
Glucose, UA: NEGATIVE mg/dL
KETONES UR: NEGATIVE mg/dL
LEUKOCYTES UA: NEGATIVE
Nitrite: NEGATIVE
Protein, ur: NEGATIVE mg/dL
SPECIFIC GRAVITY, URINE: 1.029 (ref 1.005–1.030)
Urobilinogen, UA: 1 mg/dL (ref 0.0–1.0)
pH: 6 (ref 5.0–8.0)

## 2015-01-11 LAB — URINE MICROSCOPIC-ADD ON

## 2015-01-11 LAB — WET PREP, GENITAL
TRICH WET PREP: NONE SEEN
WBC, Wet Prep HPF POC: NONE SEEN
YEAST WET PREP: NONE SEEN

## 2015-01-11 LAB — I-STAT BETA HCG BLOOD, ED (MC, WL, AP ONLY): I-stat hCG, quantitative: <5 m[IU]/mL (ref <5)

## 2015-01-11 LAB — POC URINE PREG, ED: PREG TEST UR: NEGATIVE

## 2015-01-11 MED ORDER — CEFTRIAXONE SODIUM 1 G IJ SOLR
1.0000 g | Freq: Once | INTRAMUSCULAR | Status: AC
  Administered 2015-01-11: 1 g via INTRAMUSCULAR
  Filled 2015-01-11: qty 10

## 2015-01-11 MED ORDER — ONDANSETRON 4 MG PO TBDP
8.0000 mg | ORAL_TABLET | Freq: Once | ORAL | Status: AC
  Administered 2015-01-11: 8 mg via ORAL
  Filled 2015-01-11: qty 2

## 2015-01-11 MED ORDER — AZITHROMYCIN 250 MG PO TABS
1000.0000 mg | ORAL_TABLET | Freq: Every day | ORAL | Status: DC
  Administered 2015-01-11: 1000 mg via ORAL
  Filled 2015-01-11: qty 4

## 2015-01-11 MED ORDER — LIDOCAINE HCL (PF) 1 % IJ SOLN
5.0000 mL | Freq: Once | INTRAMUSCULAR | Status: AC
  Administered 2015-01-11: 2 mL
  Filled 2015-01-11: qty 5

## 2015-01-11 MED ORDER — CYCLOBENZAPRINE HCL 10 MG PO TABS: 10.0000 mg | ORAL_TABLET | Freq: Two times a day (BID) | ORAL | Status: DC | PRN

## 2015-01-11 NOTE — Discharge Instructions (Signed)
Back Exercises The following exercises strengthen the muscles that help to support the back. They also help to keep the lower back flexible. Doing these exercises can help to prevent back pain or lessen existing pain. If you have back pain or discomfort, try doing these exercises 2-3 times each day or as told by your health care provider. When the pain goes away, do them once each day, but increase the number of times that you repeat the steps for each exercise (do more repetitions). If you do not have back pain or discomfort, do these exercises once each day or as told by your health care provider. EXERCISES Single Knee to Chest Repeat these steps 3-5 times for each leg:  Lie on your back on a firm bed or the floor with your legs extended.  Bring one knee to your chest. Your other leg should stay extended and in contact with the floor.  Hold your knee in place by grabbing your knee or thigh.  Pull on your knee until you feel a gentle stretch in your lower back.  Hold the stretch for 10-30 seconds.  Slowly release and straighten your leg. Pelvic Tilt Repeat these steps 5-10 times:  Lie on your back on a firm bed or the floor with your legs extended.  Bend your knees so they are pointing toward the ceiling and your feet are flat on the floor.  Tighten your lower abdominal muscles to press your lower back against the floor. This motion will tilt your pelvis so your tailbone points up toward the ceiling instead of pointing to your feet or the floor.  With gentle tension and even breathing, hold this position for 5-10 seconds. Cat-Cow Repeat these steps until your lower back becomes more flexible:  Get into a hands-and-knees position on a firm surface. Keep your hands under your shoulders, and keep your knees under your hips. You may place padding under your knees for comfort.  Let your head hang down, and point your tailbone toward the floor so your lower back becomes rounded like the  back of a cat.  Hold this position for 5 seconds.  Slowly lift your head and point your tailbone up toward the ceiling so your back forms a sagging arch like the back of a cow.  Hold this position for 5 seconds. Press-Ups Repeat these steps 5-10 times:  Lie on your abdomen (face-down) on the floor.  Place your palms near your head, about shoulder-width apart.  While you keep your back as relaxed as possible and keep your hips on the floor, slowly straighten your arms to raise the top half of your body and lift your shoulders. Do not use your back muscles to raise your upper torso. You may adjust the placement of your hands to make yourself more comfortable.  Hold this position for 5 seconds while you keep your back relaxed.  Slowly return to lying flat on the floor. Bridges Repeat these steps 10 times:  Lie on your back on a firm surface.  Bend your knees so they are pointing toward the ceiling and your feet are flat on the floor.  Tighten your buttocks muscles and lift your buttocks off of the floor until your waist is at almost the same height as your knees. You should feel the muscles working in your buttocks and the back of your thighs. If you do not feel these muscles, slide your feet 1-2 inches farther away from your buttocks.  Hold this position for 3-5  seconds.  Slowly lower your hips to the starting position, and allow your buttocks muscles to relax completely. If this exercise is too easy, try doing it with your arms crossed over your chest. Abdominal Crunches Repeat these steps 5-10 times:  Lie on your back on a firm bed or the floor with your legs extended.  Bend your knees so they are pointing toward the ceiling and your feet are flat on the floor.  Cross your arms over your chest.  Tip your chin slightly toward your chest without bending your neck.  Tighten your abdominal muscles and slowly raise your trunk (torso) high enough to lift your shoulder blades a  tiny bit off of the floor. Avoid raising your torso higher than that, because it can put too much stress on your low back and it does not help to strengthen your abdominal muscles.  Slowly return to your starting position. Back Lifts Repeat these steps 5-10 times:  Lie on your abdomen (face-down) with your arms at your sides, and rest your forehead on the floor.  Tighten the muscles in your legs and your buttocks.  Slowly lift your chest off of the floor while you keep your hips pressed to the floor. Keep the back of your head in line with the curve in your back. Your eyes should be looking at the floor.  Hold this position for 3-5 seconds.  Slowly return to your starting position. SEEK MEDICAL CARE IF:  Your back pain or discomfort gets much worse when you do an exercise.  Your back pain or discomfort does not lessen within 2 hours after you exercise. If you have any of these problems, stop doing these exercises right away. Do not do them again unless your health care provider says that you can. SEEK IMMEDIATE MEDICAL CARE IF:  You develop sudden, severe back pain. If this happens, stop doing the exercises right away. Do not do them again unless your health care provider says that you can.   This information is not intended to replace advice given to you by your health care provider. Make sure you discuss any questions you have with your health care provider.   Document Released: 03/24/2004 Document Revised: 11/05/2014 Document Reviewed: 04/10/2014 Elsevier Interactive Patient Education 2016 [place] Injury Prevention Back injuries can be very painful. They can also be difficult to heal. After having one back injury, you are more likely to injure your back again. It is important to learn how to avoid injuring or re-injuring your back. The following tips can help you to prevent a back injury. WHAT SHOULD I KNOW ABOUT PHYSICAL FITNESS?  Exercise for 30 minutes per day on  most days of the week or as directed by your health care provider. Make sure to:  Do aerobic exercises, such as walking, jogging, biking, or swimming.  Do exercises that increase balance and strength, such as tai chi and yoga. These can decrease your risk of falling and injuring your back.  Do stretching exercises to help with flexibility.  Try to develop strong abdominal muscles. Your abdominal muscles provide a lot of the support that is needed by your back.  Maintain a healthy weight. This helps to decrease your risk of a back injury. WHAT SHOULD I KNOW ABOUT MY DIET?  Talk with your health care provider about your overall diet. Take supplements and vitamins only as directed by your health care provider.  Talk with your health care provider about how much calcium and  vitamin D you need each day. These nutrients help to prevent weakening of the bones (osteoporosis). Osteoporosis can cause broken (fractured) bones, which lead to back pain. °· Include good sources of calcium in your diet, such as dairy products, green leafy vegetables, and products that have had calcium added to them (fortified). °· Include good sources of vitamin D in your diet, such as milk and foods that are fortified with vitamin D. °WHAT SHOULD I KNOW ABOUT MY POSTURE? °· Sit up straight and stand up straight. Avoid leaning forward when you sit or hunching over when you stand. °· Choose chairs that have good low-back (lumbar) support. °· If you work at a desk, sit close to it so you do not need to lean over. Keep your chin tucked in. Keep your neck drawn back, and keep your elbows bent at a right angle. Your arms should look like the letter "L." °· Sit high and close to the steering wheel when you drive. Add a lumbar support to your car seat, if needed. °· Avoid sitting or standing in one position for very long. Take breaks to get up, stretch, and walk around at least one time every hour. Take breaks every hour if you are  driving for long periods of time. °· Sleep on your side with your knees slightly bent, or sleep on your back with a pillow under your knees. Do not lie on the front of your body to sleep. °WHAT SHOULD I KNOW ABOUT LIFTING, TWISTING, AND REACHING? °Lifting and Heavy Lifting °· Avoid heavy lifting, especially repetitive heavy lifting. If you must do heavy lifting: °· Stretch before lifting. °· Work slowly. °· Rest between lifts. °· Use a tool such as a cart or a dolly to move objects if one is available. °· Make several small trips instead of carrying one heavy load. °· Ask for help when you need it, especially when moving big objects. °· Follow these steps when lifting: °· Stand with your feet shoulder-width apart. °· Get as close to the object as you can. Do not try to pick up a heavy object that is far from your body. °· Use handles or lifting straps if they are available. °· Bend at your knees. Squat down, but keep your heels off the floor. °· Keep your shoulders pulled back, your chin tucked in, and your back straight. °· Lift the object slowly while you tighten the muscles in your legs, abdomen, and buttocks. Keep the object as close to the center of your body as possible. °· Follow these steps when putting down a heavy load: °· Stand with your feet shoulder-width apart. °· Lower the object slowly while you tighten the muscles in your legs, abdomen, and buttocks. Keep the object as close to the center of your body as possible. °· Keep your shoulders pulled back, your chin tucked in, and your back straight. °· Bend at your knees. Squat down, but keep your heels off the floor. °· Use handles or lifting straps if they are available. °Twisting and Reaching °· Avoid lifting heavy objects above your waist. °· Do not twist at your waist while you are lifting or carrying a load. If you need to turn, move your feet. °· Do not bend over without bending at your knees. °· Avoid reaching over your head, across a table, or  for an object on a high surface. °WHAT ARE SOME OTHER TIPS? °· Avoid wet floors and icy ground. Keep sidewalks clear of ice to prevent   falls.  Do not sleep on a mattress that is too soft or too hard.  Keep items that are used frequently within easy reach.  Put heavier objects on shelves at waist level, and put lighter objects on lower or higher shelves.  Find ways to decrease your stress, such as exercise, massage, or relaxation techniques. Stress can build up in your muscles. Tense muscles are more vulnerable to injury.  Talk with your health care provider if you feel anxious or depressed. These conditions can make back pain worse.  Wear flat heel shoes with cushioned soles.  Avoid sudden movements.  Use both shoulder straps when carrying a backpack.  Do not use any tobacco products, including cigarettes, chewing tobacco, or electronic cigarettes. If you need help quitting, ask your health care provider.   This information is not intended to replace advice given to you by your health care provider. Make sure you discuss any questions you have with your health care provider.   Document Released: 03/24/2004 Document Revised: 07/01/2014 Document Reviewed: 02/18/2014 Elsevier Interactive Patient Education 2016 [place] Sex Safe sex is about reducing the risk of giving or getting a sexually transmitted disease (STD). STDs are spread through sexual contact involving the genitals, mouth, or rectum. Some STDs can be cured and others cannot. Safe sex can also prevent unintended pregnancies.  WHAT ARE SOME SAFE SEX PRACTICES?  Limit your sexual activity to only one partner who is having sex with only you.  Talk to your partner about his or her past partners, past STDs, and drug use.  Use a condom every time you have sexual intercourse. This includes vaginal, oral, and anal sexual activity. Both females and males should wear condoms during oral sex. Only use latex or polyurethane  condoms and water-based lubricants. Using petroleum-based lubricants or oils to lubricate a condom will weaken the condom and increase the chance that it will break. The condom should be in place from the beginning to the end of sexual activity. Wearing a condom reduces, but does not completely eliminate, your risk of getting or giving an STD. STDs can be spread by contact with infected body fluids and skin.  Get vaccinated for hepatitis B and HPV.  Avoid alcohol and recreational drugs, which can affect your judgment. You may forget to use a condom or participate in high-risk sex.  For females, avoid douching after sexual intercourse. Douching can spread an infection farther into the reproductive tract.  Check your body for signs of sores, blisters, rashes, or unusual discharge. See your health care provider if you notice any of these signs.  Avoid sexual contact if you have symptoms of an infection or are being treated for an STD. If you or your partner has herpes, avoid sexual contact when blisters are present. Use condoms at all other times.  If you are at risk of being infected with HIV, it is recommended that you take a prescription medicine daily to prevent HIV infection. This is called pre-exposure prophylaxis (PrEP). You are considered at risk if:  You are a man who has sex with other men (MSM).  You are a heterosexual man or woman who is sexually active with more than one partner.  You take drugs by injection.  You are sexually active with a partner who has HIV.  Talk with your health care provider about whether you are at high risk of being infected with HIV. If you choose to begin PrEP, you should first be tested for HIV.  You should then be tested every 3 months for as long as you are taking PrEP.  See your health care provider for regular screenings, exams, and tests for other STDs. Before having sex with a new partner, each of you should be screened for STDs and should talk about  the results with each other. WHAT ARE THE BENEFITS OF SAFE SEX?   There is less chance of getting or giving an STD.  You can prevent unwanted or unintended pregnancies.  By discussing safe sex concerns with your partner, you may increase feelings of intimacy, comfort, trust, and honesty between the two of you.   This information is not intended to replace advice given to you by your health care provider. Make sure you discuss any questions you have with your health care provider.   Document Released: 03/24/2004 Document Revised: 03/07/2014 Document Reviewed: 08/08/2011 Elsevier Interactive Patient Education 2016 [employer].  Pregnancy and Sexually Transmitted Diseases A sexually transmitted disease (STD) is a disease or infection that may be passed (transmitted) from person to person, usually during sexual activity. This may happen by way of saliva, semen, blood, vaginal mucus, or urine. An STD can be caused by bacteria, viruses, or parasites.  During pregnancy, STDs can be dangerous for both you and your unborn baby. It is important to take steps to reduce your chances of getting an STD. Also, you need to be looked at by your health care provider right away if you think you may have an STD or may have been exposed to an STD. Diagnosis and treatment will depend on the type of STD. If you are already pregnant, you will be screened for HIV (human immunodeficiency virus) early in your pregnancy. If you are at high risk for HIV, this test may be repeated during your third trimester of pregnancy. WHAT ARE SOME COMMON STDs? There are different types of STDs. Some STDs that cause problems in pregnancy include:  Gonorrhea.   Chlamydia.   Syphilis.   HIV and AIDS.   Genital herpes.   Hepatitis.   Genital warts.   Human papillomavirus (HPV). STDs that do not affect the baby include:   Trichomonas.   Pubic lice.  WHAT ARE THE POSSIBLE EFFECTS OF STDs DURING PREGNANCY? STDs  can have various effects during pregnancy. STDs can cause:   Stillbirth.   Miscarriage.   Premature labor.   Premature rupture of the membranes.   Serious birth defects or deformities.   Infection of the amniotic sac.   Infections that occur after birth (postpartum) in you and the baby.   Slowed growth of the baby before birth.   Illnesses in newborns.  WHAT ARE COMMON SYMPTOMS OF STDs? Different STDs have different symptoms. Some women may not have any symptoms. If symptoms are present, they may include:  Painful or bloody urination.   Pain in the pelvis, abdomen, vagina, anus, throat, or eyes.  A skin rash, itching, or irritation.   Growths, ulcerations, blisters, or sores in the genital and anal areas.   Fever.   Abnormal vaginal discharge with or without bad odor.   Pain or bleeding during sexual intercourse.   Yellow skin and eyes (jaundice). This is seen with hepatitis.   Swollen glands in the groin area.  Even if symptoms are not present, an STD can still be passed to another person during sexual contact.  HOW ARE STDs DIAGNOSED? Your health care provider can determine if you have an STD through different tests. These can include  blood tests, urine tests, and tests performed during a pelvic exam. You should be screened for sexually transmitted illnesses (STIs), including gonorrhea and chlamydia if:   You are sexually active and are younger than 21 years old.  You are older than 21 years old and your health care provider tells you that you are at risk for this type of infection.  Your sexual activity has changed since you were last screened, and you are at an increased risk for chlamydia or gonorrhea. Ask your health care provider if you are at risk. HOW CAN I REDUCE MY RISK OF GETTING AN STD?  Take these steps to reduce your risk of getting an STD:  Use a latex condom or female condom during sexual intercourse.   Use dental dams and  water-soluble lubricants during sexual activity. Do not use petroleum jelly or oils.  Avoid having multiple sex partners.  Do not have sex with someone who has other sex partners.  Do not have sex with anyone you do not know or who is at high risk for an STD.   Avoid risky sex acts that can break the skin.  Do not have sex if you have open sores on your mouth or skin.  Avoid engaging in oral and anal sex acts.   Get the hepatitis vaccine. It is safe for pregnant women.  WHAT SHOULD I DO IF I THINK I HAVE AN STD?  See your health care provider.  Tell your sexual partner(s). They should be tested and treated for any STDs.  Do not have sex until your health care provider says it is okay. WHEN SHOULD I GET IMMEDIATE MEDICAL CARE? Contact your health care provider right away if:   You have any symptoms of an STD.  You think you or your sex partner has an STD, even if there are no symptoms.  You think you may have been exposed to an STD.   This information is not intended to replace advice given to you by your health care provider. Make sure you discuss any questions you have with your health care provider.   Document Released: 03/24/2004 Document Revised: 03/07/2014 Document Reviewed: 09/13/2012 Elsevier Interactive Patient Education [employer].
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
## 2015-01-12 LAB — GC/CHLAMYDIA PROBE AMP (~~LOC~~) NOT AT ARMC
Chlamydia: NEGATIVE
NEISSERIA GONORRHEA: NEGATIVE

## 2015-04-06 ENCOUNTER — Encounter (HOSPITAL_COMMUNITY): Payer: Self-pay | Admitting: Emergency Medicine

## 2015-04-06 ENCOUNTER — Emergency Department (HOSPITAL_COMMUNITY)
Admission: EM | Admit: 2015-04-06 | Discharge: 2015-04-06 | Disposition: A | Discharge status: Home / Self Care | Payer: Medicaid Other | Source: Home / Self Care | Attending: Family Medicine | Admitting: Family Medicine

## 2015-04-06 ENCOUNTER — Other Ambulatory Visit (HOSPITAL_COMMUNITY)
Admission: RE | Admit: 2015-04-06 | Discharge: 2015-04-06 | Disposition: A | Discharge status: Home / Self Care | Payer: Medicaid Other | Source: Ambulatory Visit | Attending: Family Medicine | Admitting: Family Medicine

## 2015-04-06 DIAGNOSIS — Z113 Encounter for screening for infections with a predominantly sexual mode of transmission: Secondary | ICD-10-CM | POA: Insufficient documentation

## 2015-04-06 DIAGNOSIS — Z202 Contact with and (suspected) exposure to infections with a predominantly sexual mode of transmission: Secondary | ICD-10-CM

## 2015-04-06 DIAGNOSIS — N760 Acute vaginitis: Secondary | ICD-10-CM | POA: Insufficient documentation

## 2015-04-06 MED ORDER — LIDOCAINE HCL (PF) 1 % IJ SOLN
INTRAMUSCULAR | Status: AC
  Filled 2015-04-06: qty 5

## 2015-04-06 MED ORDER — AZITHROMYCIN 250 MG PO TABS
ORAL_TABLET | ORAL | Status: AC
  Filled 2015-04-06: qty 4

## 2015-04-06 MED ORDER — AZITHROMYCIN 250 MG PO TABS
1000.0000 mg | ORAL_TABLET | Freq: Once | ORAL | Status: AC
  Administered 2015-04-06: 1000 mg via ORAL

## 2015-04-06 MED ORDER — CEFTRIAXONE SODIUM 250 MG IJ SOLR
INTRAMUSCULAR | Status: AC
  Filled 2015-04-06: qty 250

## 2015-04-06 MED ORDER — CEFTRIAXONE SODIUM 250 MG IJ SOLR
250.0000 mg | Freq: Once | INTRAMUSCULAR | Status: AC
  Administered 2015-04-06: 250 mg via INTRAMUSCULAR

## 2015-04-06 MED ORDER — METRONIDAZOLE 500 MG PO TABS: 500.0000 mg | ORAL_TABLET | Freq: Two times a day (BID) | ORAL | Status: DC

## 2015-04-06 NOTE — ED Notes (Signed)
The patient presented to the [facility] with a complaint of an exposure to an STD.

## 2015-04-06 NOTE — Discharge Instructions (Signed)
Sexually Transmitted Disease °A sexually transmitted disease (STD) is a disease or infection that may be passed (transmitted) from person to person, usually during sexual activity. This may happen by way of saliva, semen, blood, vaginal mucus, or urine. Common STDs include: °· Gonorrhea. °· Chlamydia. °· Syphilis. °· HIV and AIDS. °· Genital herpes. °· Hepatitis B and C. °· Trichomonas. °· Human papillomavirus (HPV). °· Pubic lice. °· Scabies. °· Mites. °· Bacterial vaginosis. °WHAT ARE CAUSES OF STDs? °An STD may be caused by bacteria, a virus, or parasites. STDs are often transmitted during sexual activity if one person is infected. However, they may also be transmitted through nonsexual means. STDs may be transmitted after:  °· Sexual intercourse with an infected person. °· Sharing sex toys with an infected person. °· Sharing needles with an infected person or using unclean piercing or tattoo needles. °· Having intimate contact with the genitals, mouth, or rectal areas of an infected person. °· Exposure to infected fluids during birth. °WHAT ARE THE SIGNS AND SYMPTOMS OF STDs? °Different STDs have different symptoms. Some people may not have any symptoms. If symptoms are present, they may include: °· Painful or bloody urination. °· Pain in the pelvis, abdomen, vagina, anus, throat, or eyes. °· A skin rash, itching, or irritation. °· Growths, ulcerations, blisters, or sores in the genital and anal areas. °· Abnormal vaginal discharge with or without bad odor. °· Penile discharge in men. °· Fever. °· Pain or bleeding during sexual intercourse. °· Swollen glands in the groin area. °· Yellow skin and eyes (jaundice). This is seen with hepatitis. °· Swollen testicles. °· Infertility. °· Sores and blisters in the mouth. °HOW ARE STDs DIAGNOSED? °To make a diagnosis, your health care provider may: °· Take a medical history. °· Perform a physical exam. °· Take a sample of any discharge to examine. °· Swab the throat,  cervix, opening to the penis, rectum, or vagina for testing. °· Test a sample of your first morning urine. °· Perform blood tests. °· Perform a Pap test, if this applies. °· Perform a colposcopy. °· Perform a laparoscopy. °HOW ARE STDs TREATED? °Treatment depends on the STD. Some STDs may be treated but not cured. °· Chlamydia, gonorrhea, trichomonas, and syphilis can be cured with antibiotic medicine. °· Genital herpes, hepatitis, and HIV can be treated, but not cured, with prescribed medicines. The medicines lessen symptoms. °· Genital warts from HPV can be treated with medicine or by freezing, burning (electrocautery), or surgery. Warts may come back. °· HPV cannot be cured with medicine or surgery. However, abnormal areas may be removed from the cervix, vagina, or vulva. °· If your diagnosis is confirmed, your recent sexual partners need treatment. This is true even if they are symptom-free or have a negative culture or evaluation. They should not have sex until their health care providers say it is okay. °· Your health care provider may test you for infection again 3 months after treatment. °HOW CAN I REDUCE MY RISK OF GETTING AN STD? °Take these steps to reduce your risk of getting an STD: °· Use latex condoms, dental dams, and water-soluble lubricants during sexual activity. Do not use petroleum jelly or oils. °· Avoid having multiple sex partners. °· Do not have sex with someone who has other sex partners °· Do not have sex with anyone you do not know or who is at high risk for an STD. °· Avoid risky sex practices that can break your skin. °· Do not have sex   if you have open sores on your mouth or skin. °· Avoid drinking too much alcohol or taking illegal drugs. Alcohol and drugs can affect your judgment and put you in a vulnerable position. °· Avoid engaging in oral and anal sex acts. °· Get vaccinated for HPV and hepatitis. If you have not received these vaccines in the past, talk to your health care  provider about whether one or both might be right for you. °· If you are at risk of being infected with HIV, it is recommended that you take a prescription medicine daily to prevent HIV infection. This is called pre-exposure prophylaxis (PrEP). You are considered at risk if: °¨ You are a man who has sex with other men (MSM). °¨ You are a heterosexual man or woman and are sexually active with more than one partner. °¨ You take drugs by injection. °¨ You are sexually active with a partner who has HIV. °· Talk with your health care provider about whether you are at high risk of being infected with HIV. If you choose to begin PrEP, you should first be tested for HIV. You should then be tested every 3 months for as long as you are taking PrEP. °WHAT SHOULD I DO IF I THINK I HAVE AN STD? °· See your health care provider. °· Tell your sexual partner(s). They should be tested and treated for any STDs. °· Do not have sex until your health care provider says it is okay. °WHEN SHOULD I GET IMMEDIATE MEDICAL CARE? °Contact your health care provider right away if:  °· You have severe abdominal pain. °· You are a man and notice swelling or pain in your testicles. °· You are a woman and notice swelling or pain in your vagina. °  °This information is not intended to replace advice given to you by your health care provider. Make sure you discuss any questions you have with your health care provider. °  °Document Released: 05/07/2002 Document Revised: 03/07/2014 Document Reviewed: 09/04/2012 °Elsevier Interactive Patient Education ©2016 [employer] ° °
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
## 2015-04-07 LAB — URINE CYTOLOGY ANCILLARY ONLY
CHLAMYDIA, DNA PROBE: NEGATIVE
Neisseria Gonorrhea: NEGATIVE
Trichomonas: NEGATIVE

## 2015-04-21 ENCOUNTER — Telehealth (HOSPITAL_COMMUNITY): Payer: Self-pay | Admitting: Emergency Medicine

## 2015-04-21 NOTE — ED Notes (Signed)
Called pt and notified of recent lab results from visit Pt ID'd properly... Voices no new concerns  Per Dr. Dayton Scrape,  Tests for chlamydia/gonorrhea/trichomonas were negative.  Recheck or followup with pcp/Myra Dove for persistent symptoms. Ria Clock MD  Adv pt if sx are not getting better to return  Education on safe sex given Pt verb understanding.

## 2015-06-18 ENCOUNTER — Ambulatory Visit: Payer: Medicaid Other | Admitting: Obstetrics & Gynecology

## 2015-08-17 ENCOUNTER — Ambulatory Visit: Payer: Medicaid Other | Admitting: Medical

## 2015-09-24 ENCOUNTER — Ambulatory Visit (INDEPENDENT_AMBULATORY_CARE_PROVIDER_SITE_OTHER): Payer: Medicaid Other | Admitting: Family Medicine

## 2015-09-24 ENCOUNTER — Encounter: Payer: Self-pay | Admitting: Family Medicine

## 2015-09-24 VITALS — BP 149/102 | HR 82 | Ht 64.0 in | Wt 295.0 lb

## 2015-09-24 DIAGNOSIS — Z3046 Encounter for surveillance of implantable subdermal contraceptive: Secondary | ICD-10-CM

## 2015-09-24 DIAGNOSIS — I10 Essential (primary) hypertension: Secondary | ICD-10-CM

## 2015-09-24 MED ORDER — NORGESTIMATE-ETH ESTRADIOL 0.25-35 MG-MCG PO TABS: 1.0000 | ORAL_TABLET | Freq: Every day | ORAL | 11 refills | Status: DC

## 2015-09-24 NOTE — Patient Instructions (Signed)
Ethinyl Estradiol; Norgestimate tablets What is this medicine? ETHINYL ESTRADIOL; NORGESTIMATE (ETH in il es tra DYE ole; nor JES ti mate) is an oral contraceptive. The products combine two types of female hormones, an estrogen and a progestin. They are used to prevent ovulation and pregnancy. Some products are also used to treat acne in females. This medicine may be used for other purposes; ask your health care provider or pharmacist if you have questions. What should I tell my health care provider before I take this medicine? They need to know if you have or ever had any of these conditions: -abnormal vaginal bleeding -blood vessel disease or blood clots -breast, cervical, endometrial, ovarian, liver, or uterine cancer -diabetes -gallbladder disease -heart disease or recent heart attack -high blood pressure -high cholesterol -kidney disease -liver disease -migraine headaches -stroke -systemic lupus erythematosus (SLE) -tobacco smoker -an unusual or allergic reaction to estrogens, progestins, other medicines, foods, dyes, or preservatives -pregnant or trying to get pregnant -breast-feeding How should I use this medicine? Take this medicine by mouth. To reduce nausea, this medicine may be taken with food. Follow the directions on the prescription label. Take this medicine at the same time each day and in the order directed on the package. Do not take your medicine more often than directed. Contact your pediatrician regarding the use of this medicine in children. Special care may be needed. This medicine has been used in female children who have started having menstrual periods. A patient package insert for the product will be given with each prescription and refill. Read this sheet carefully each time. The sheet may change frequently. Overdosage: If you think you have taken too much of this medicine contact a poison control center or emergency room at once. NOTE: This medicine is only for  you. Do not share this medicine with others. What if I miss a dose? If you miss a dose, refer to the patient information sheet you received with your medicine for direction. If you miss more than one pill, this medicine may not be as effective and you may need to use another form of birth control. What may interact with this medicine? -acetaminophen -antibiotics or medicines for infections, especially rifampin, rifabutin, rifapentine, and griseofulvin, and possibly penicillins or tetracyclines -aprepitant -ascorbic acid (vitamin C) -atorvastatin -barbiturate medicines, such as phenobarbital -bosentan -carbamazepine -caffeine -clofibrate -cyclosporine -dantrolene -doxercalciferol -felbamate -grapefruit juice -hydrocortisone -medicines for anxiety or sleeping problems, such as diazepam or temazepam -medicines for diabetes, including pioglitazone -mineral oil -modafinil -mycophenolate -nefazodone -oxcarbazepine -phenytoin -prednisolone -ritonavir or other medicines for HIV infection or AIDS -rosuvastatin -selegiline -soy isoflavones supplements -St. John's wort -tamoxifen or raloxifene -theophylline -thyroid hormones -topiramate -warfarin This list may not describe all possible interactions. Give your health care provider a list of all the medicines, herbs, non-prescription drugs, or dietary supplements you use. Also tell them if you smoke, drink alcohol, or use illegal drugs. Some items may interact with your medicine. What should I watch for while using this medicine? Visit your doctor or health care professional for regular checks on your progress. You will need a regular breast and pelvic exam and Pap smear while on this medicine. You should also discuss the need for regular mammograms with your health care professional, and follow his or her guidelines for these tests. This medicine can make your body retain fluid, making your fingers, hands, or ankles swell. Your blood  pressure can go up. Contact your doctor or health care professional if you feel you are retaining   fluid. Use an additional method of contraception during the first cycle that you take these tablets. If you have any reason to think you are pregnant, stop taking this medicine right away and contact your doctor or health care professional. If you are taking this medicine for hormone related problems, it may take several cycles of use to see improvement in your condition. Do not use this product if you smoke and are over 35 years of age. Smoking increases the risk of getting a blood clot or having a stroke while you are taking birth control pills, especially if you are more than 22 years old. If you are a smoker who is 35 years of age or younger, you are strongly advised not to smoke while taking birth control pills. This medicine can make you more sensitive to the sun. Keep out of the sun. If you cannot avoid being in the sun, wear protective clothing and use sunscreen. Do not use sun lamps or tanning beds/booths. If you wear contact lenses and notice visual changes, or if the lenses begin to feel uncomfortable, consult your eye care specialist. In some women, tenderness, swelling, or minor bleeding of the gums may occur. Notify your dentist if this happens. Brushing and flossing your teeth regularly may help limit this. See your dentist regularly and inform your dentist of the medicines you are taking. If you are going to have elective surgery, you may need to stop taking this medicine before the surgery. Consult your health care professional for advice. This medicine does not protect you against HIV infection (AIDS) or any other sexually transmitted diseases. What side effects may I notice from receiving this medicine? Side effects that you should report to your doctor or health care professional as soon as possible: -breast tissue changes or discharge -changes in vaginal bleeding during your period or  between your periods -chest pain -coughing up blood -dizziness or fainting spells -headaches or migraines -leg, arm or groin pain -severe or sudden headaches -stomach pain (severe) -sudden shortness of breath -sudden loss of coordination, especially on one side of the body -speech problems -symptoms of vaginal infection like itching, irritation or unusual discharge -tenderness in the upper abdomen -vomiting -weakness or numbness in the arms or legs, especially on one side of the body -yellowing of the eyes or skin Side effects that usually do not require medical attention (report to your doctor or health care professional if they continue or are bothersome): -breakthrough bleeding and spotting that continues beyond the 3 initial cycles of pills -breast tenderness -mood changes, anxiety, depression, frustration, anger, or emotional outbursts -increased sensitivity to sun or ultraviolet light -nausea -skin rash, acne, or brown spots on the skin -weight gain (slight) This list may not describe all possible side effects. Call your doctor for medical advice about side effects. You may report side effects to FDA at 1-800-FDA-1088. Where should I keep my medicine? Keep out of the reach of children. Store at room temperature between 15 and 30 degrees C (59 and 86 degrees F). Throw away any unused medicine after the expiration date. NOTE: This sheet is a summary. It may not cover all possible information. If you have questions about this medicine, talk to your doctor, pharmacist, or health care provider.    2016, Elsevier/Gold Standard. (2013-10-11 19:50:40)  

## 2015-09-24 NOTE — Progress Notes (Signed)
  Nexplanon Removal:  Patient given informed consent for removal of her Implanon, time out was performed.  Signed copy in the chart.  Appropriate time out taken. Implanon site identified.  Area prepped in usual sterile fashon. One cc of 1% lidocaine was used to anesthetize the area at the distal end of the implant. A small stab incision was made right beside the implant on the distal portion.  Attempts were made at grasping the device, but were unsuccessful.  In palpating the rod, it was realized that the initial incision was made too distal and the process was repeated at the distal end and the nexplanon rod was successfully grasped using hemostats and removed without difficulty.  There was less than 3 cc blood loss. There were no complications.  A small amount of antibiotic ointment and steri-strips were applied over the small incision.  A pressure bandage was applied to reduce any bruising.  The patient tolerated the procedure well and was given post procedure instructions.  Patient started on OCPs and referred to [facility] due to elevated BP.

## 2015-10-13 ENCOUNTER — Encounter: Payer: Self-pay | Admitting: General Practice

## 2015-11-27 ENCOUNTER — Encounter (HOSPITAL_COMMUNITY): Payer: Self-pay | Admitting: Emergency Medicine

## 2015-11-27 DIAGNOSIS — I10 Essential (primary) hypertension: Secondary | ICD-10-CM | POA: Diagnosis not present

## 2015-11-27 DIAGNOSIS — B349 Viral infection, unspecified: Secondary | ICD-10-CM | POA: Diagnosis not present

## 2015-11-27 DIAGNOSIS — M791 Myalgia: Secondary | ICD-10-CM | POA: Diagnosis present

## 2015-11-27 DIAGNOSIS — J45909 Unspecified asthma, uncomplicated: Secondary | ICD-10-CM | POA: Insufficient documentation

## 2015-11-27 DIAGNOSIS — F17210 Nicotine dependence, cigarettes, uncomplicated: Secondary | ICD-10-CM | POA: Insufficient documentation

## 2015-11-27 LAB — CBC WITH DIFFERENTIAL/PLATELET
BASOS PCT: 0 %
Basophils Absolute: 0 10*3/uL (ref 0.0–0.1)
EOS ABS: 0 10*3/uL (ref 0.0–0.7)
Eosinophils Relative: 0 %
HEMATOCRIT: 42.2 % (ref 36.0–46.0)
HEMOGLOBIN: 13.8 g/dL (ref 12.0–15.0)
LYMPHS ABS: 1.3 10*3/uL (ref 0.7–4.0)
Lymphocytes Relative: 23 %
MCH: 28.8 pg (ref 26.0–34.0)
MCHC: 32.7 g/dL (ref 30.0–36.0)
MCV: 87.9 fL (ref 78.0–100.0)
Monocytes Absolute: 0.6 10*3/uL (ref 0.1–1.0)
Monocytes Relative: 12 %
NEUTROS ABS: 3.6 10*3/uL (ref 1.7–7.7)
NEUTROS PCT: 65 %
Platelets: 291 10*3/uL (ref 150–400)
RBC: 4.8 MIL/uL (ref 3.87–5.11)
RDW: 12.5 % (ref 11.5–15.5)
WBC: 5.5 10*3/uL (ref 4.0–10.5)

## 2015-11-27 LAB — COMPREHENSIVE METABOLIC PANEL
ALBUMIN: 3.9 g/dL (ref 3.5–5.0)
ALK PHOS: 75 U/L (ref 38–126)
ALT: 23 U/L (ref 14–54)
AST: 25 U/L (ref 15–41)
Anion gap: 9 (ref 5–15)
BILIRUBIN TOTAL: 0.5 mg/dL (ref 0.3–1.2)
BUN: 7 mg/dL (ref 6–20)
CALCIUM: 9.2 mg/dL (ref 8.9–10.3)
CO2: 23 mmol/L (ref 22–32)
CREATININE: 0.75 mg/dL (ref 0.44–1.00)
Chloride: 107 mmol/L (ref 101–111)
GFR calc Af Amer: >60 mL/min (ref >60)
GFR calc non Af Amer: >60 mL/min (ref >60)
Glucose, Bld: 107 mg/dL — ABNORMAL HIGH (ref 65–99)
Potassium: 3.5 mmol/L (ref 3.5–5.1)
SODIUM: 139 mmol/L (ref 135–145)
TOTAL PROTEIN: 7.5 g/dL (ref 6.5–8.1)

## 2015-11-27 LAB — URINE MICROSCOPIC-ADD ON: RBC / HPF: NONE SEEN RBC/hpf (ref 0–5)

## 2015-11-27 LAB — URINALYSIS, ROUTINE W REFLEX MICROSCOPIC
BILIRUBIN URINE: NEGATIVE
GLUCOSE, UA: NEGATIVE mg/dL
HGB URINE DIPSTICK: NEGATIVE
KETONES UR: NEGATIVE mg/dL
Leukocytes, UA: NEGATIVE
Nitrite: NEGATIVE
PH: 6 (ref 5.0–8.0)
Protein, ur: 30 mg/dL — AB
Specific Gravity, Urine: >1.046 — ABNORMAL HIGH (ref 1.005–1.030)

## 2015-11-27 LAB — POC URINE PREG, ED: Preg Test, Ur: NEGATIVE

## 2015-11-27 NOTE — ED Triage Notes (Signed)
Pt. reports generalized body aches with fatigue , fever and headache onset today . No cough or congestion / respirations unlabored .

## 2015-11-28 ENCOUNTER — Emergency Department (HOSPITAL_COMMUNITY)
Admission: EM | Admit: 2015-11-28 | Discharge: 2015-11-28 | Disposition: A | Discharge status: Home / Self Care | Payer: Medicaid Other | Attending: Emergency Medicine | Admitting: Emergency Medicine

## 2015-11-28 ENCOUNTER — Emergency Department (HOSPITAL_COMMUNITY): Payer: Medicaid Other

## 2015-11-28 DIAGNOSIS — B349 Viral infection, unspecified: Secondary | ICD-10-CM

## 2015-11-28 HISTORY — DX: Obesity, unspecified: E66.9

## 2015-11-28 MED ORDER — ACETAMINOPHEN 325 MG PO TABS
650.0000 mg | ORAL_TABLET | Freq: Once | ORAL | Status: AC
  Administered 2015-11-28: 650 mg via ORAL
  Filled 2015-11-28: qty 2

## 2015-11-28 NOTE — ED Notes (Signed)
Patient transported to X-ray 

## 2015-11-28 NOTE — ED Provider Notes (Signed)
[facility]-EMERGENCY DEPT Provider Note   CSN: [id number] Arrival date & time: 11/27/15  2007  By signing my name below, I, [name], attest that this documentation has been prepared under the direction and in the presence of Tilden Fossa, MD. Electronically Signed: [name], ED Scribe. 11/28/15. 12:50 AM.  History   Chief Complaint Chief Complaint  Patient presents with  . Generalized Body Aches   The history is provided by the patient. No language interpreter was used.   HPI Comments: Judy Wood is a 20 y.o. female with a PMHx of asthma, HTN (uncontrolled), and obesity, who presents to the Emergency Department complaining of gradually worsening generalized arthralgias and myalgias onset this morning PTA. Pt reports associated non-productive cough, fever (Tmax 101.8), diarrhea, intermittent diffuse abdominal pain, thoracic/lower back pain, and increased sneezing secondary to her symptoms. She has been taking Tylenol with minimal relief of her symptoms. Her diffuse myalgias are moderately exacerbated with movement, and she notes that deep inspirations and cough will suddenly exacerbated her diffuse pains. Pt does have children and notes that her son has had a cough recently, but no sick contact with similar symptoms otherwise. No recent travel outside of the country. Pt has not been on a burst of antibiotics recently. Denies emesis, nausea, dysuria, sore throat, ear pain, or any other associated symptoms.  Past Medical History:  Diagnosis Date  . Asthma    pt states she took albuterol inhaler lately  . H/O umbilical hernia repair   . Hernia, umbilical   . Hypertension    on meds  . Hypertension   . Obesity   . Syphilis 08/30/2014   Treated   Patient Active Problem List   Diagnosis Date Noted  . History of C-section 11/24/2014  . History of syphilis 11/24/2014  . Obesity 11/24/2014  . History of placenta abruption 11/24/2014  . Gonorrhea 11/24/2014  .  Vaginal bleeding 09/29/2014  . Hypertension 10/17/2011   Past Surgical History:  Procedure Laterality Date  . CESAREAN SECTION N/A 05/19/2012   Procedure:  Primary CESAREAN SECTION  of baby girl  at 1933  APGAR 4/5/5;  Surgeon: Adam Phenix, MD;  Location: [facility];  Service: Obstetrics;  Laterality: N/A;  . UMBILICAL HERNIA REPAIR     as a child  . UMBILICAL HERNIA REPAIR     OB History    Gravida Para Term Preterm AB Living   2 2 2     2    SAB TAB Ectopic Multiple Live Births         0 2     Home Medications    Prior to Admission medications   Medication Sig Start Date End Date Taking? Authorizing Provider  acetaminophen (TYLENOL) 325 MG tablet Take 2 tablets (650 mg total) by mouth every 4 (four) hours as needed (for pain scale < 4). Patient not taking: Reported on 09/24/2015 10/15/14   Kathrynn Running, MD  cyclobenzaprine (FLEXERIL) 10 MG tablet Take 1 tablet (10 mg total) by mouth 2 (two) times daily as needed for muscle spasms. Take as prescribed. Do NOT take greater or more frequently then prescribed. Do NOT take with other sedating medications or ANY alcohol as this can result in death. This medication can impair coordination and reflexes, and cause drowsiness. Do NOT perform tasks in which this would place you in danger as it can make you a FALL RISK. Patient not taking: Reported on 09/24/2015 01/11/15   Jonette Eva, MD  labetalol (NORMODYNE)  100 MG tablet Take 1 tablet (100 mg total) by mouth 2 (two) times daily. Patient not taking: Reported on 01/11/2015 10/06/14   Rhona Raider Stinson, DO  metroNIDAZOLE (FLAGYL) 500 MG tablet Take 1 tablet (500 mg total) by mouth 2 (two) times daily. Patient not taking: Reported on 09/24/2015 04/06/15   Tharon Aquas, PA  norgestimate-ethinyl estradiol (ORTHO-CYCLEN,SPRINTEC,PREVIFEM) 0.25-35 MG-MCG tablet Take 1 tablet by mouth daily. 09/24/15   Levie Heritage, DO  prenatal vitamin w/FE, FA (PRENATAL 1 + 1) 27-1 MG TABS tablet Take 1 tablet by  mouth daily at 12 noon.    Historical Provider, MD  sertraline (ZOLOFT) 50 MG tablet Take 1 tablet (50 mg total) by mouth at bedtime. Patient not taking: Reported on 01/11/2015 10/15/14   Kathrynn Running, MD   Family History Family History  Problem Relation Age of Onset  . Hypertension Mother   . Anemia Mother   . Depression Mother   . Deep vein thrombosis Maternal Grandmother   . Diabetes Maternal Grandmother   . Kidney disease Maternal Grandmother   . Cancer Maternal Grandmother     colon cancer  . Diabetes Father    Social History Social History  Substance Use Topics  . Smoking status: Current Every Day Smoker    Types: Cigarettes  . Smokeless tobacco: Never Used  . Alcohol use Yes   Allergies   Banana; Lactose intolerance (gi); and Sulfa antibiotics  Review of Systems Review of Systems  Constitutional: Positive for fever (Tmax 101.8).  HENT: Positive for sneezing. Negative for ear pain and sore throat.   Respiratory: Positive for cough.   Gastrointestinal: Positive for diarrhea. Negative for nausea and vomiting.  Genitourinary: Negative for dysuria.  Musculoskeletal: Positive for arthralgias, back pain and myalgias.  All other systems reviewed and are negative.  Physical Exam Updated Vital Signs BP (!) 131/102   Pulse 96   Temp 98.6 F (37 C) (Oral)   Resp 20   Ht 5\' 4"  (1.626 m)   Wt 290 lb (131.5 kg)   LMP 11/18/2015 (Exact Date)   SpO2 97%   BMI 49.78 kg/m   Physical Exam  Constitutional: She is oriented to person, place, and time. She appears well-developed and well-nourished.  HENT:  Head: Normocephalic and atraumatic.  Cardiovascular: Regular rhythm.  Tachycardia present.   No murmur heard. Pulmonary/Chest: Effort normal and breath sounds normal. No respiratory distress.  Abdominal: Soft. There is no tenderness. There is no rebound and no guarding.  Musculoskeletal: She exhibits no edema or tenderness.  Neurological: She is alert and oriented to  person, place, and time.  Skin: Skin is warm and dry.  Psychiatric: She has a normal mood and affect. Her behavior is normal.  Nursing note and vitals reviewed.  ED Treatments / Results  DIAGNOSTIC STUDIES: Oxygen Saturation is 99% on RA, normal by my interpretation.   COORDINATION OF CARE: 12:50 AM-Discussed next steps with pt. Pt verbalized understanding and is agreeable with the plan.   Labs (all labs ordered are listed, but only abnormal results are displayed) Labs Reviewed  COMPREHENSIVE METABOLIC PANEL - Abnormal; Notable for the following:       Result Value   Glucose, Bld 107 (*)    All other components within normal limits  URINALYSIS, ROUTINE W REFLEX MICROSCOPIC (NOT AT [facility]) - Abnormal; Notable for the following:    APPearance HAZY (*)    Specific Gravity, Urine >1.046 (*)    Protein, ur 30 (*)  All other components within normal limits  URINE MICROSCOPIC-ADD ON - Abnormal; Notable for the following:    Squamous Epithelial / LPF 6-30 (*)    Bacteria, UA FEW (*)    All other components within normal limits  CBC WITH DIFFERENTIAL/PLATELET  POC URINE PREG, ED   EKG  EKG Interpretation None      Radiology Dg Chest 2 View  Result Date: 11/28/2015 CLINICAL DATA:  Fever and cough. Shortness of breath and chest pain. History of hypertension. Smoker. EXAM: CHEST  2 VIEW COMPARISON:  07/14/2014 FINDINGS: The heart size and mediastinal contours are within normal limits. Both lungs are clear. The visualized skeletal structures are unremarkable. IMPRESSION: No active cardiopulmonary disease. Electronically Signed   By: Burman NievesWilliam  Stevens M.D.   On: 11/28/2015 01:50    Procedures Procedures (including critical care time)  Medications Ordered in ED Medications  acetaminophen (TYLENOL) tablet 650 mg (650 mg Oral Given 11/28/15 0148)    Initial Impression / Assessment and Plan / ED Course  I have reviewed the triage vital signs and the nursing notes.  Pertinent labs  & imaging results that were available during my care of the patient were reviewed by me and considered in my medical decision making (see chart for details).  Clinical Course   Pt here with fever, body aches, respiratory sxs, GI sxs.  She is nontoxic on exam with no respiratory distress.  Abdominal examination is benign.  No evidence of pna on exam or xray.  D/w pt findings of likely viral illness and home care with oral fluid hydration, tylenol/ibuprofen prn fever and body aches, outpatient follow up and return precautions.    Final Clinical Impressions(s) / ED Diagnoses   Final diagnoses:  Viral illness   New Prescriptions Discharge Medication List as of 11/28/2015  2:07 AM     I personally performed the services described in this documentation, which was scribed in my presence. The recorded information has been reviewed and is accurate.     Tilden FossaElizabeth Stirling Orton, MD 11/28/15 [phone]

## 2015-12-30 ENCOUNTER — Other Ambulatory Visit (HOSPITAL_COMMUNITY)
Admission: RE | Admit: 2015-12-30 | Discharge: 2015-12-30 | Disposition: A | Discharge status: Home / Self Care | Payer: Medicaid Other | Source: Ambulatory Visit | Attending: Obstetrics and Gynecology | Admitting: Obstetrics and Gynecology

## 2015-12-30 ENCOUNTER — Ambulatory Visit (INDEPENDENT_AMBULATORY_CARE_PROVIDER_SITE_OTHER): Payer: Medicaid Other | Admitting: Clinical

## 2015-12-30 ENCOUNTER — Ambulatory Visit (INDEPENDENT_AMBULATORY_CARE_PROVIDER_SITE_OTHER): Payer: Medicaid Other | Admitting: Obstetrics and Gynecology

## 2015-12-30 ENCOUNTER — Encounter: Payer: Self-pay | Admitting: Obstetrics and Gynecology

## 2015-12-30 VITALS — BP 167/116 | HR 97 | Ht 65.0 in | Wt 291.0 lb

## 2015-12-30 DIAGNOSIS — Z01419 Encounter for gynecological examination (general) (routine) without abnormal findings: Secondary | ICD-10-CM | POA: Insufficient documentation

## 2015-12-30 DIAGNOSIS — Z113 Encounter for screening for infections with a predominantly sexual mode of transmission: Secondary | ICD-10-CM | POA: Diagnosis not present

## 2015-12-30 DIAGNOSIS — Z1239 Encounter for other screening for malignant neoplasm of breast: Secondary | ICD-10-CM | POA: Insufficient documentation

## 2015-12-30 DIAGNOSIS — A549 Gonococcal infection, unspecified: Secondary | ICD-10-CM | POA: Diagnosis not present

## 2015-12-30 DIAGNOSIS — Z124 Encounter for screening for malignant neoplasm of cervix: Secondary | ICD-10-CM | POA: Diagnosis not present

## 2015-12-30 DIAGNOSIS — Z8619 Personal history of other infectious and parasitic diseases: Secondary | ICD-10-CM

## 2015-12-30 DIAGNOSIS — F32A Depression, unspecified: Secondary | ICD-10-CM | POA: Insufficient documentation

## 2015-12-30 DIAGNOSIS — F3289 Other specified depressive episodes: Secondary | ICD-10-CM

## 2015-12-30 DIAGNOSIS — F4323 Adjustment disorder with mixed anxiety and depressed mood: Secondary | ICD-10-CM

## 2015-12-30 DIAGNOSIS — Z3202 Encounter for pregnancy test, result negative: Secondary | ICD-10-CM | POA: Diagnosis not present

## 2015-12-30 DIAGNOSIS — I10 Essential (primary) hypertension: Secondary | ICD-10-CM

## 2015-12-30 DIAGNOSIS — F329 Major depressive disorder, single episode, unspecified: Secondary | ICD-10-CM | POA: Insufficient documentation

## 2015-12-30 DIAGNOSIS — Z3041 Encounter for surveillance of contraceptive pills: Secondary | ICD-10-CM

## 2015-12-30 LAB — POCT PREGNANCY, URINE: Preg Test, Ur: NEGATIVE

## 2015-12-30 MED ORDER — SERTRALINE HCL 50 MG PO TABS: 50.0000 mg | ORAL_TABLET | Freq: Every day | ORAL | 2 refills | Status: DC

## 2015-12-30 MED ORDER — HYDROCHLOROTHIAZIDE 25 MG PO TABS: 25.0000 mg | ORAL_TABLET | Freq: Every day | ORAL | 2 refills | Status: DC

## 2015-12-30 NOTE — BH Specialist Note (Signed)
Session Start time: 12:15   End Time: 12:30 Total Time:  45 minutes Type of Service: Behavioral Health - Individual/Family Interpreter: No.   Interpreter Name & Language: n/a # [facility]   SUBJECTIVE: Judy BowensCierra D Wood is a 20 y.o. female  Pt. was referred by Venia CarbonJennifer Rasch, NP for:  anxiety and depression. Pt. reports the following symptoms/concerns: Pt states that she has been feeling a lot of stress over relationship issues with the father of her 1yo child(he is currently incarcerated), as well as not feeling heard by her own mother, concerning her feelings. Pt admits to "drinking too much" , as well as turning to food, to cope with feelings of depression, not drinking alcohol daily, but feeling irritated when she does not drink. Pt open to learning new skill to cope when she feels stress, and possibly alcohol treatment. Duration of problem:  Recognizing an issue over two months Severity: moderate Previous treatment: treated with zoloft in past, was very helpful at that time "years ago"   OBJECTIVE: Mood: Appropriate & Affect: Appropriate Risk of harm to self or others: No known risk of harm to self or others Assessments administered: PHQ9: 14/ GAD7: 11  LIFE CONTEXT:  Family & Social: Lives with mother  Product/process development scientistchool/ Work: Going to [employer], working on obtaining GED  Self-Care: admits to coping with life stressors in unhealthy ways only(food, alcohol, "weed"); open to learning healthier ways to self-care Life changes: none current What is important to pt/family (values): Relationships   GOALS ADDRESSED:  -Alleviate symptoms of anxiety and depression -Become aware of link between depression and alcohol use   PLAN: 1. F/U with behavioral health clinician: Two weeks 2. Behavioral Health meds: Zoloft(beginning today) 3. Behavioral recommendations:  -Read educational material regarding coping with symptoms of anxiety and depression -Read educational material regarding  safe levels of alcohol usage -Practice daily relaxation breathing exercise, to cope with emotions -Try out different apps, as discussed in office visit, for continued self-care -Consider calling [facility] to set up appointment for substance treatment -Consider Worry Hour strategy to prioritize stressors 4. Referral: Brief Counseling/Psychotherapy, Psychoeducation and Referral to Substance Abuse Program 5. From scale of 1-10, how likely are you to follow plan: n/a   Gaynell FaceJamie C [name] LCSWA Behavioral Health Clinician  Warmhandoff:   Warm Hand Off Completed.        [facility] 12/30/2015 08/21/2014 04/23/2014  Decreased Interest 0 0 0  Down, Depressed, Hopeless 1 0 0  PHQ - 2 Score 1 0 0  Altered sleeping 3 - -  Tired, decreased energy 3 - -  Change in appetite 3 - -  Feeling bad or failure about yourself  1 - -  Trouble concentrating 3 - -  Moving slowly or fidgety/restless 0 - -  Suicidal thoughts 0 - -  PHQ-9 Score 14 - -   GAD 7 : Generalized Anxiety Score 12/30/2015  Nervous, Anxious, on Edge 0  Control/stop worrying 2  Worry too much - different things 3  Trouble relaxing 0  Restless 2  Easily annoyed or irritable 3  Afraid - awful might happen 1  Total GAD 7 Score 11

## 2015-12-30 NOTE — Progress Notes (Signed)
Patient needs to see Jamie 

## 2015-12-31 LAB — FLUORESCENT TREPONEMAL AB(FTA)-IGG-BLD: Fluorescent Treponemal ABS: REACTIVE — AB

## 2015-12-31 LAB — WET PREP, GENITAL: TRICH WET PREP: NONE SEEN

## 2015-12-31 LAB — CYTOLOGY - PAP: DIAGNOSIS: NEGATIVE

## 2015-12-31 LAB — GC/CHLAMYDIA PROBE AMP (~~LOC~~) NOT AT ARMC
CHLAMYDIA, DNA PROBE: NEGATIVE
Neisseria Gonorrhea: POSITIVE — AB

## 2015-12-31 LAB — RPR: RPR Ser Ql: REACTIVE — AB

## 2015-12-31 LAB — HEPATITIS B SURFACE ANTIGEN: Hepatitis B Surface Ag: NEGATIVE

## 2015-12-31 LAB — RPR TITER: RPR Titer: 1:2 {titer}

## 2015-12-31 LAB — HIV ANTIBODY (ROUTINE TESTING W REFLEX): HIV: NONREACTIVE

## 2016-01-01 ENCOUNTER — Telehealth: Payer: Self-pay | Admitting: *Deleted

## 2016-01-01 NOTE — Telephone Encounter (Signed)
Patient is positive for gonorrhea and needs treatment. Called patient, no answer. Left message stating I am calling her with test results and she needs to return my call at the clinic. [facility]

## 2016-01-01 NOTE — Telephone Encounter (Signed)
Attempted to call patient again. No answer. Voice mail left to please return my call before noon today so I can let her know the results. [facility]

## 2016-01-06 ENCOUNTER — Telehealth: Payer: Self-pay | Admitting: Obstetrics and Gynecology

## 2016-01-06 NOTE — Telephone Encounter (Signed)
Called patient- no answer I have a left a message for patient to call us back regarding results.

## 2016-01-06 NOTE — Telephone Encounter (Signed)
Attempted to contact patient with the phone number provided. This is an inaccurate phone number listed. Attempted to call the patient to inform her of her results from her recent office visit.   Duane LopeJennifer I Rasch, NP

## 2016-01-06 NOTE — Progress Notes (Signed)
Subjective:     Judy Wood is a 20 y.o. female here for a routine exam.  Current complaints:   She is here for an annual pap and STD testing.  The patient reports depression symptoms , and abnormal menstrual cycle.  Her last menstrual cycle was 11/18/2015. She normally has regular periods, every 30 days. She is currently on BC pills. [facility] and is happy with this method. She had nexplanon removed in August. Concerned about pregnancy today due to one abnormal menstrua cycle.  Personal health questionnaire reviewed: yes.   Gynecologic History Patient's last menstrual period was 11/18/2015 (exact date). Contraception: OCP (estrogen/progesterone) Last Pap: 2013. Results were: normal Last mammogram: NA. Results were: NA  Obstetric History OB History  Gravida Para Term Preterm AB Living  2 2 2     2   SAB TAB Ectopic Multiple Live Births        0 2    # Outcome Date GA Lbr Len/2nd Weight Sex Delivery Anes PTL Lv  2 Term 10/13/14 2944w1d 07:45 / 00:06 5 lb 9.4 oz (2.534 kg) M Vag-Spont EPI  LIV  1 Term 05/19/12 6150w2d  6 lb 5.9 oz (2.889 kg) F CS-LTranv EPI  LIV     The following portions of the patient's history were reviewed and updated as appropriate: allergies, current medications, past family history, past medical history, past social history, past surgical history and problem list.  Review of Systems Behavioral/Psych: negative for abusive relationship, aggressive behavior, behavior problems and thoughts of harm to herself or others     Objective:    BP (!) 167/116   Pulse 97   Ht 5\' 5"  (1.651 m)   Wt 291 lb (132 kg)   LMP 11/18/2015 (Exact Date)   Breastfeeding? No   BMI 48.42 kg/m   General Appearance:    Alert, cooperative, no distress, appears stated age  Head:    Normocephalic, without obvious abnormality, atraumatic  Eyes:    PERRL, conjunctiva/corneas clear, EOM's intact, fundi    benign, both eyes  Ears:    Normal external ear canals, both ears   Nose:   Nares normal, septum midline, mucosa normal, no drainage    or sinus tenderness  Throat:   Lips, mucosa, and tongue normal; teeth and gums normal  Neck:   Supple, symmetrical, trachea midline, no adenopathy;    thyroid:  no enlargement/tenderness/nodules; no carotid   bruit or JVD  Back:     Symmetric, no curvature, ROM normal, no CVA tenderness  Lungs:     Clear to auscultation bilaterally, respirations unlabored  Chest Wall:    No tenderness or deformity   Heart:    Regular rate and rhythm, S1 and S2 normal, no murmur, rub   or gallop  Breast Exam:    No tenderness, masses, or nipple abnormality  Abdomen:     Soft, non-tender, bowel sounds active all four quadrants,    no masses, no organomegaly  Genitalia:    Normal female without lesion, discharge or tenderness  Rectal:    Deferred   Extremities:   Extremities normal, atraumatic, no cyanosis or edema  Pulses:   2+ and symmetric all extremities  Skin:   Skin color, texture, turgor normal, no rashes or lesions  Lymph nodes:   Cervical, supraclavicular, and axillary nodes normal  Neurologic:   CNII-XII intact, normal strength, sensation and reflexes    throughout   Assessment:   Healthy female exam. Essessential hypertension  Depression  Surveillance of birth control   Plan:   RX: HCTZ, Zoloft  Instructed patient to call PCP for follow up and management of chronic health conditions  MI/stroke precautions.  Education reviewed: depression evaluation, low fat, low cholesterol diet, safe sex/STD prevention, self breast exams and weight bearing exercise.    Duane LopeJennifer I Sanjiv Castorena, NP

## 2016-01-07 NOTE — Telephone Encounter (Signed)
Mychart message to patient asking her to call us to set up appointment for treatment.

## 2016-01-08 NOTE — Telephone Encounter (Signed)
Pt called and stated that she is passing clots and hurting.  I called pt and pt informed me that she started her period on Sunday and it stopped on Thursday.  She stated that she passed a quarter size clot x 2 when she "pushing to go the bathroom".  I asked pt if she was constipated she stated "yes".  I asked if she was bleeding now, pt stated that she was and it was very heavy.  Pt stated that she was bleeding so heavily that it got on her grandmother's sheet.  I advised pt to go the MAU for evaluation because our office is currently closed.  Pt stated understanding with no further questions.

## 2016-01-11 ENCOUNTER — Encounter (HOSPITAL_COMMUNITY): Payer: Self-pay | Admitting: *Deleted

## 2016-01-11 ENCOUNTER — Inpatient Hospital Stay (HOSPITAL_COMMUNITY)
Admission: AD | Admit: 2016-01-11 | Discharge: 2016-01-11 | Disposition: A | Discharge status: Home / Self Care | Payer: Medicaid Other | Source: Ambulatory Visit | Attending: Family Medicine | Admitting: Family Medicine

## 2016-01-11 DIAGNOSIS — F17210 Nicotine dependence, cigarettes, uncomplicated: Secondary | ICD-10-CM | POA: Insufficient documentation

## 2016-01-11 DIAGNOSIS — A599 Trichomoniasis, unspecified: Secondary | ICD-10-CM

## 2016-01-11 DIAGNOSIS — N939 Abnormal uterine and vaginal bleeding, unspecified: Secondary | ICD-10-CM | POA: Diagnosis present

## 2016-01-11 DIAGNOSIS — A5901 Trichomonal vulvovaginitis: Secondary | ICD-10-CM | POA: Diagnosis not present

## 2016-01-11 DIAGNOSIS — J45909 Unspecified asthma, uncomplicated: Secondary | ICD-10-CM | POA: Insufficient documentation

## 2016-01-11 DIAGNOSIS — A5402 Gonococcal vulvovaginitis, unspecified: Secondary | ICD-10-CM | POA: Insufficient documentation

## 2016-01-11 DIAGNOSIS — I10 Essential (primary) hypertension: Secondary | ICD-10-CM | POA: Diagnosis not present

## 2016-01-11 DIAGNOSIS — A549 Gonococcal infection, unspecified: Secondary | ICD-10-CM

## 2016-01-11 HISTORY — DX: Major depressive disorder, single episode, unspecified: F32.9

## 2016-01-11 HISTORY — DX: Depression, unspecified: F32.A

## 2016-01-11 LAB — CBC WITH DIFFERENTIAL/PLATELET
BASOS PCT: 0 %
Basophils Absolute: 0 10*3/uL (ref 0.0–0.1)
EOS ABS: 0.1 10*3/uL (ref 0.0–0.7)
Eosinophils Relative: 2 %
HEMATOCRIT: 39.7 % (ref 36.0–46.0)
HEMOGLOBIN: 13.8 g/dL (ref 12.0–15.0)
LYMPHS ABS: 3.4 10*3/uL (ref 0.7–4.0)
Lymphocytes Relative: 46 %
MCH: 29.6 pg (ref 26.0–34.0)
MCHC: 34.8 g/dL (ref 30.0–36.0)
MCV: 85 fL (ref 78.0–100.0)
Monocytes Absolute: 0.5 10*3/uL (ref 0.1–1.0)
Monocytes Relative: 6 %
NEUTROS ABS: 3.4 10*3/uL (ref 1.7–7.7)
NEUTROS PCT: 46 %
Platelets: 322 10*3/uL (ref 150–400)
RBC: 4.67 MIL/uL (ref 3.87–5.11)
RDW: 12.8 % (ref 11.5–15.5)
WBC: 7.3 10*3/uL (ref 4.0–10.5)

## 2016-01-11 LAB — URINALYSIS, ROUTINE W REFLEX MICROSCOPIC
BILIRUBIN URINE: NEGATIVE
GLUCOSE, UA: NEGATIVE mg/dL
KETONES UR: NEGATIVE mg/dL
Nitrite: NEGATIVE
PROTEIN: 30 mg/dL — AB
Specific Gravity, Urine: 1.025 (ref 1.005–1.030)
pH: 6 (ref 5.0–8.0)

## 2016-01-11 LAB — URINE MICROSCOPIC-ADD ON

## 2016-01-11 LAB — POCT PREGNANCY, URINE: Preg Test, Ur: NEGATIVE

## 2016-01-11 MED ORDER — AZITHROMYCIN 250 MG PO TABS
1000.0000 mg | ORAL_TABLET | Freq: Once | ORAL | Status: AC
  Administered 2016-01-11: 1000 mg via ORAL
  Filled 2016-01-11: qty 4

## 2016-01-11 MED ORDER — METRONIDAZOLE 500 MG PO TABS
2000.0000 mg | ORAL_TABLET | Freq: Once | ORAL | Status: AC
  Administered 2016-01-11: 2000 mg via ORAL
  Filled 2016-01-11: qty 4

## 2016-01-11 MED ORDER — CEFTRIAXONE SODIUM 250 MG IJ SOLR
250.0000 mg | Freq: Once | INTRAMUSCULAR | Status: AC
  Administered 2016-01-11: 250 mg via INTRAMUSCULAR
  Filled 2016-01-11: qty 250

## 2016-01-11 NOTE — Discharge Instructions (Signed)
Abnormal Uterine Bleeding Abnormal uterine bleeding means bleeding from the vagina that is not your normal menstrual period. This can be:  Bleeding or spotting between periods.  Bleeding after sex (sexual intercourse).  Bleeding that is heavier or more than normal.  Periods that last longer than usual.  Bleeding after menopause. There are many problems that may cause this. Treatment will depend on the cause of the bleeding. Any kind of bleeding that is not normal should be reviewed by your doctor.  HOME CARE Watch your condition for any changes. These actions may lessen any discomfort you are having:  Do not use tampons or douches as told by your doctor.  Change your pads often. You should get regular pelvic exams and Pap tests. Keep all appointments for tests as told by your doctor. GET HELP IF:  You are bleeding for more than 1 week.  You feel dizzy at times. GET HELP RIGHT AWAY IF:   You pass out.  You have to change pads every 15 to 30 minutes.  You have belly pain.  You have a fever.  You become sweaty or weak.  You are passing large blood clots from the vagina.  You feel sick to your stomach (nauseous) and throw up (vomit). MAKE SURE YOU:  Understand these instructions.  Will watch your condition.  Will get help right away if you are not doing well or get worse.   This information is not intended to replace advice given to you by your health care provider. Make sure you discuss any questions you have with your health care provider.   Document Released: 12/12/2008 Document Revised: 02/19/2013 Document Reviewed: 09/13/2012 Elsevier Interactive Patient Education 2016 [employer] Trichomoniasis Trichomoniasis is an infection caused by an organism called Trichomonas. The infection can affect both women and men. In women, the outer female genitalia and the vagina are affected. In men, the penis is mainly affected, but the prostate and other reproductive organs  can also be involved. Trichomoniasis is a sexually transmitted infection (STI) and is most often passed to another person through sexual contact.  RISK FACTORS  Having unprotected sexual intercourse.  Having sexual intercourse with an infected partner. SIGNS AND SYMPTOMS  Symptoms of trichomoniasis in women include:  Abnormal gray-green frothy vaginal discharge.  Itching and irritation of the vagina.  Itching and irritation of the area outside the vagina. Symptoms of trichomoniasis in men include:   Penile discharge with or without pain.  Pain during urination. This results from inflammation of the urethra. DIAGNOSIS  Trichomoniasis may be found during a Pap test or physical exam. Your health care provider may use one of the following methods to help diagnose this infection:  Testing the pH of the vagina with a test tape.  Using a vaginal swab test that checks for the Trichomonas organism. A test is available that provides results within a few minutes.  Examining a urine sample.  Testing vaginal secretions. Your health care provider may test you for other STIs, including HIV. TREATMENT   You may be given medicine to fight the infection. Women should inform their health care provider if they could be or are pregnant. Some medicines used to treat the infection should not be taken during pregnancy.  Your health care provider may recommend over-the-counter medicines or creams to decrease itching or irritation.  Your sexual partner will need to be treated if infected.  Your health care provider may test you for infection again 3 months after treatment. HOME CARE INSTRUCTIONS  Take medicines only as directed by your health care provider.  Take over-the-counter medicine for itching or irritation as directed by your health care provider.  Do not have sexual intercourse while you have the infection.  Women should not douche or wear tampons while they have the  infection.  Discuss your infection with your partner. Your partner may have gotten the infection from you, or you may have gotten it from your partner.  Have your sex partner get examined and treated if necessary.  Practice safe, informed, and protected sex.  See your health care provider for other STI testing. SEEK MEDICAL CARE IF:   You still have symptoms after you finish your medicine.  You develop abdominal pain.  You have pain when you urinate.  You have bleeding after sexual intercourse.  You develop a rash.  Your medicine makes you sick or makes you throw up (vomit). MAKE SURE YOU:  Understand these instructions.  Will watch your condition.  Will get help right away if you are not doing well or get worse.   This information is not intended to replace advice given to you by your health care provider. Make sure you discuss any questions you have with your health care provider.   Document Released: 08/10/2000 Document Revised: 03/07/2014 Document Reviewed: 11/26/2012 Elsevier Interactive Patient Education [employer] Inc[employer] Gonorrhea Gonorrhea is an infection that can cause serious problems. If left untreated, the infection may:   Damage the female or female organs.   Cause women to be unable to have children (sterility).   Harm a fetus if the infected woman is pregnant.  It is important to get treatment for gonorrhea as soon as possible. It is also necessary that all your sexual partners be tested for the infection.  CAUSES  Gonorrhea is caused by bacteria called Neisseria gonorrhoeae. The infection is spread from person to person, usually by sexual contact (such as by anal, vaginal, or oral means). A newborn can contract the infection from his or her mother during birth.  RISK FACTORS  Being a woman younger than 22 years of age who is sexually active.  Being a woman 22 years of age or older who has:  A new sex partner.  More than one sex partner.  A sex  partner who has a sexually transmitted disease (STD).  Using condoms inconsistently.  Currently having, or having previously had, an STD.  Exchanging sex or money or drugs. SYMPTOMS  Some people with gonorrhea do not have symptoms. Symptoms may be different in females and males.  Females The most common symptoms are:   Pain in the lower abdomen.   Fever with or without chills.  Other symptoms include:   Abnormal vaginal discharge.   Painful intercourse.   Burning or itching of the vagina or lips of the vagina.   Abnormal vaginal bleeding.   Pain when urinating.   Long-lasting (chronic) pain in the lower abdomen, especially during menstruation or intercourse.   Inability to become pregnant.   Going into premature labor.   Irritation, pain, bleeding, or discharge from the rectum. This may occur if the infection was spread by anal sex.   Sore throat or swollen lymph nodes in the neck. This may occur if the infection was spread by oral sex.  Males The most common symptoms are:   Discharge from the penis.   Pain or burning during urination.   Pain or swelling in the testicles. Other symptoms may include:   Irritation, pain, bleeding,  or discharge from the rectum. This may occur if the infection was spread by anal sex.   Sore throat, fever, or swollen lymph nodes in the neck. This may occur if the infection was spread by oral sex.  DIAGNOSIS  A diagnosis is made after a physical exam is done and a sample of discharge is examined under a microscope for the presence of the bacteria. The discharge may be taken from the urethra, cervix, throat, or rectum.  TREATMENT  Gonorrhea is treated with antibiotic medicines. It is important for treatment to begin as soon as possible. Early treatment may prevent some problems from developing. Do not have sex. Avoid all types of sexual activity for 7 days after treatment is complete and until any sex partners have been  treated. HOME CARE INSTRUCTIONS   Take medicines only as directed by your health care provider.   Take your antibiotic medicine as directed by your health care provider. Finish the antibiotic even if you start to feel better. Incomplete treatment will put you at risk for continued infection.   Do not have sex until treatment is complete or as directed by your health care provider.   Keep all follow-up visits as directed by your health care provider.   Not all test results are available during your visit. If your test results are not back during the visit, make an appointment with your health care provider to find out the results. Do not assume everything is normal if you have not heard from your health care provider or the medical facility. It is your responsibility to get your test results.  If you test positive for gonorrhea, inform your recent sexual partners. They need to be checked for gonorrhea even if they do not have symptoms. They may need treatment, even if they test negative for gonorrhea.  SEEK MEDICAL CARE IF:   You develop any bad reaction to the medicine you were prescribed. This may include:   A rash.   Nausea.   Vomiting.   Diarrhea.   Your symptoms do not improve after a few days of taking antibiotics.   Your symptoms get worse.   You develop increased pain, such as in the testicles (for males) or in the abdomen (for females).  You have a fever. MAKE SURE YOU:   Understand these instructions.  Will watch your condition.  Will get help right away if you are not doing well or get worse.   This information is not intended to replace advice given to you by your health care provider. Make sure you discuss any questions you have with your health care provider.   Document Released: 02/12/2000 Document Revised: 03/07/2014 Document Reviewed: 08/22/2012 Elsevier Interactive Patient Education [employer]

## 2016-01-11 NOTE — MAU Note (Signed)
Pt called, not in lobby 

## 2016-01-11 NOTE — MAU Note (Signed)
Pt did not have a period in October, period started Nov 4, stopped on the 9th.  Then started again the next day, has been bleeding since, passing clots, heavy.  Is also having back pain & lower abd pain.

## 2016-01-11 NOTE — MAU Provider Note (Signed)
[facility] Attending [facility] Note  Chief Complaint: Vaginal Bleeding; Abdominal Pain; and Back Pain    First Provider Initiated Contact with Patient 01/11/16 2027      SUBJECTIVE Judy Wood is a 20 y.o. G2P2002 at Unknown by LMP who presents with abnormal bleeding. Removed Nexplanon and on OC's. Just started new pack. Had normal cycle then began bleeding again. Was seen in [facility] on 11/1 with positive Gonorrhea, untreated. Unable to get in touch with.  Past Medical History:  Diagnosis Date  . Asthma    pt states she took albuterol inhaler lately  . Depression   . H/O umbilical hernia repair   . Hernia, umbilical   . Hypertension    on meds  . Hypertension   . Obesity   . Syphilis 08/30/2014   Treated   OB History  Gravida Para Term Preterm AB Living  2 2 2     2   SAB TAB Ectopic Multiple Live Births        0 2    # Outcome Date GA Lbr Len/2nd Weight Sex Delivery Anes PTL Lv  2 Term 10/13/14 [redacted]w[redacted]d 07:45 / 00:06 5 lb 9.4 oz (2.534 kg) M Vag-Spont EPI  LIV  1 Term 05/19/12 [redacted]w[redacted]d  6 lb 5.9 oz (2.889 kg) F CS-LTranv EPI  LIV     Past Surgical History:  Procedure Laterality Date  . CESAREAN SECTION N/A 05/19/2012   Procedure:  Primary CESAREAN SECTION  of baby girl  at 1933  APGAR 4/5/5;  Surgeon: Adam Phenix, MD;  Location: [facility];  Service: Obstetrics;  Laterality: N/A;  . UMBILICAL HERNIA REPAIR     as a child  . UMBILICAL HERNIA REPAIR     Social History   Social History  . Marital status: Single    Spouse name: N/A  . Number of children: N/A  . Years of education: N/A   Occupational History  . Not on file.   Social History Main Topics  . Smoking status: Current Every Day Smoker    Packs/day: 0.50    Types: Cigarettes  . Smokeless tobacco: Never Used  . Alcohol use Yes  . Drug use:     Types: Marijuana, Other-see comments     Comment: Stopped when she found out she was pregnant   . Sexual activity: Yes    Birth control/ protection: Pill    Other Topics Concern  . Not on file   Social History Narrative  . No narrative on file   No current facility-administered medications on file prior to encounter.    Current Outpatient Prescriptions on File Prior to Encounter  Medication Sig Dispense Refill  . acetaminophen (TYLENOL) 325 MG tablet Take 2 tablets (650 mg total) by mouth every 4 (four) hours as needed (for pain scale < 4). (Patient not taking: Reported on 09/24/2015) 90 tablet 3  . cyclobenzaprine (FLEXERIL) 10 MG tablet Take 1 tablet (10 mg total) by mouth 2 (two) times daily as needed for muscle spasms. Take as prescribed. Do NOT take greater or more frequently then prescribed. Do NOT take with other sedating medications or ANY alcohol as this can result in death. This medication can impair coordination and reflexes, and cause drowsiness. Do NOT perform tasks in which this would place you in danger as it can make you a FALL RISK. (Patient not taking: Reported on 09/24/2015) 4 tablet 0  . hydrochlorothiazide (HYDRODIURIL) 25 MG tablet Take 25 mg by mouth.    Marland Kitchen  hydrochlorothiazide (HYDRODIURIL) 25 MG tablet Take 1 tablet (25 mg total) by mouth daily. 30 tablet 2  . labetalol (NORMODYNE) 100 MG tablet Take 1 tablet (100 mg total) by mouth 2 (two) times daily. (Patient not taking: Reported on 12/30/2015) 60 tablet 1  . metroNIDAZOLE (FLAGYL) 500 MG tablet Take 1 tablet (500 mg total) by mouth 2 (two) times daily. (Patient not taking: Reported on 09/24/2015) 21 tablet 0  . norgestimate-ethinyl estradiol (ORTHO-CYCLEN,SPRINTEC,PREVIFEM) 0.25-35 MG-MCG tablet Take 1 tablet by mouth daily. 1 Package 11  . prenatal vitamin w/FE, FA (PRENATAL 1 + 1) 27-1 MG TABS tablet Take 1 tablet by mouth daily at 12 noon.    . sertraline (ZOLOFT) 50 MG tablet Take 1 tablet (50 mg total) by mouth at bedtime. (Patient not taking: Reported on 01/11/2015) 30 tablet 1  . sertraline (ZOLOFT) 50 MG tablet Take 1 tablet (50 mg total) by mouth at bedtime. 30  tablet 2   Allergies  Allergen Reactions  . Banana Anaphylaxis  . Lactose Intolerance (Gi) Diarrhea and Nausea Only  . Sulfa Antibiotics Other (See Comments)    Childhood reaction    ROS: Pertinent items in HPI  OBJECTIVE BP 139/97 (BP Location: Right Arm) Comment: Pt didn't take HTN meds today  Pulse 110   Temp 98.3 F (36.8 C)   Resp 18   Ht 5\' 4"  (1.626 m)   Wt 286 lb (129.7 kg)   LMP 01/03/2016   BMI 49.09 kg/m  CONSTITUTIONAL: Well-developed, well-nourished female in no acute distress.  HENT:  Normocephalic, atraumatic, Oropharynx is clear and moist EYES: Conjunctivae and EOM are normal. . No scleral icterus.  NECK: Normal range of motion, supple, no masses.  Normal thyroid.  SKIN: Skin is warm and dry. No rash noted. Not diaphoretic. No erythema. No pallor. NEUROLGIC: Alert and oriented to person, place, and time.  No cranial nerve deficit noted. PSYCHIATRIC: Normal mood and affect. Normal behavior. Normal judgment and thought content. CARDIOVASCULAR: Normal heart rate noted RESPIRATORY: Effort normal, no problems with respiration noted. ABDOMEN: Soft, normal bowel sounds, no distention noted.  No tenderness, rebound or guarding.  MUSCULOSKELETAL: Normal range of motion. No tenderness.  No cyanosis, clubbing, or edema.  2+ distal pulses.  LAB RESULTS Results for orders placed or performed during the hospital encounter of 01/11/16 (from the past 48 hour(s))  Urinalysis, Routine w reflex microscopic (not [facility])     Status: Abnormal   Collection Time: 01/11/16  6:30 PM  Result Value Ref Range   Color, Urine AMBER (A) YELLOW    Comment: BIOCHEMICALS MAY BE AFFECTED BY COLOR   APPearance CLEAR CLEAR   Specific Gravity, Urine 1.025 1.005 - 1.030   pH 6.0 5.0 - 8.0   Glucose, UA NEGATIVE NEGATIVE mg/dL   Hgb urine dipstick LARGE (A) NEGATIVE   Bilirubin Urine NEGATIVE NEGATIVE   Ketones, ur NEGATIVE NEGATIVE mg/dL   Protein, ur 30 (A) NEGATIVE mg/dL   Nitrite  NEGATIVE NEGATIVE   Leukocytes, UA SMALL (A) NEGATIVE  Urine microscopic-add on     Status: Abnormal   Collection Time: 01/11/16  6:30 PM  Result Value Ref Range   Squamous Epithelial / LPF 6-30 (A) NONE SEEN   WBC, UA 6-30 0 - 5 WBC/hpf   RBC / HPF 6-30 0 - 5 RBC/hpf   Bacteria, UA MANY (A) NONE SEEN   Trichomonas, UA PRESENT   Pregnancy, urine POC     Status: None   Collection Time: 01/11/16  6:48  PM  Result Value Ref Range   Preg Test, Ur NEGATIVE NEGATIVE    Comment:        THE SENSITIVITY OF THIS METHODOLOGY IS >24 mIU/mL   CBC with Differential/Platelet     Status: None   Collection Time: 01/11/16  8:32 PM  Result Value Ref Range   WBC 7.3 4.0 - 10.5 K/uL   RBC 4.67 3.87 - 5.11 MIL/uL   Hemoglobin 13.8 12.0 - 15.0 g/dL   HCT 16.139.7 09.636.0 - 04.546.0 %   MCV 85.0 78.0 - 100.0 fL   MCH 29.6 26.0 - 34.0 pg   MCHC 34.8 30.0 - 36.0 g/dL   RDW 40.912.8 81.111.5 - 91.415.5 %   Platelets 322 150 - 400 K/uL   Neutrophils Relative % 46 %   Neutro Abs 3.4 1.7 - 7.7 K/uL   Lymphocytes Relative 46 %   Lymphs Abs 3.4 0.7 - 4.0 K/uL   Monocytes Relative 6 %   Monocytes Absolute 0.5 0.1 - 1.0 K/uL   Eosinophils Relative 2 %   Eosinophils Absolute 0.1 0.0 - 0.7 K/uL   Basophils Relative 0 %   Basophils Absolute 0.0 0.0 - 0.1 K/uL    IMAGING No results found.  MAU COURSE S/p rocephin, Zithromax and Flagyl  ASSESSMENT 1. Abnormal uterine bleeding unrelated to menstrual cycle   2. Gonorrhea   3. Trichomonosis    Bleeding is likely related to untreated GC. Safe sex practices discussed. Partner should be notified and treated.  Continue BP meds daily  PLAN Discharge home Follow-up Information    [facility]   Specialty:  Obstetrics and Gynecology Contact information: [address] [place] [id number] [phone]           Medication List    STOP taking these medications   acetaminophen 325 MG tablet Commonly  known as:  TYLENOL   cyclobenzaprine 10 MG tablet Commonly known as:  FLEXERIL   metroNIDAZOLE 500 MG tablet Commonly known as:  FLAGYL   sertraline 50 MG tablet Commonly known as:  ZOLOFT     TAKE these medications   hydrochlorothiazide 25 MG tablet Commonly known as:  HYDRODIURIL Take 1 tablet (25 mg total) by mouth daily. What changed:  Another medication with the same name was removed. Continue taking this medication, and follow the directions you see here.   labetalol 100 MG tablet Commonly known as:  NORMODYNE Take 1 tablet (100 mg total) by mouth 2 (two) times daily.   norgestimate-ethinyl estradiol 0.25-35 MG-MCG tablet Commonly known as:  ORTHO-CYCLEN,SPRINTEC,PREVIFEM Take 1 tablet by mouth daily.   prenatal vitamin w/FE, FA 27-1 MG Tabs tablet Take 1 tablet by mouth daily at 12 noon.        Reva Boresanya S Endre Coutts, MD 01/11/2016 8:58 PM

## 2016-01-14 ENCOUNTER — Telehealth: Payer: Self-pay | Admitting: General Practice

## 2016-01-14 NOTE — Telephone Encounter (Signed)
Patient called and left message stating she was recently treated for an infection but is still passing big clots.

## 2016-01-26 NOTE — Telephone Encounter (Signed)
Called pt to follow up on her previous call regarding passing large clots.  She stated that her bleeding has stopped. She is continuing to take the birth control pills as prescribed and is also taking her Labetalol.  Pt was advised that she will need follow up appt in our office in January.  She will be called with appt details.  She voiced understanding and stated that a message can be left on her voice mail if she does not answer.

## 2016-03-03 ENCOUNTER — Ambulatory Visit: Payer: Self-pay | Admitting: Obstetrics & Gynecology

## 2016-07-07 IMAGING — US US UA CORD DOPPLER
1 series · 12 of 28 positions shown · non-contrast
Comparison: none

[Series 1: us ob +14 all · 12 of 47 slices shown]
[im 2/47]
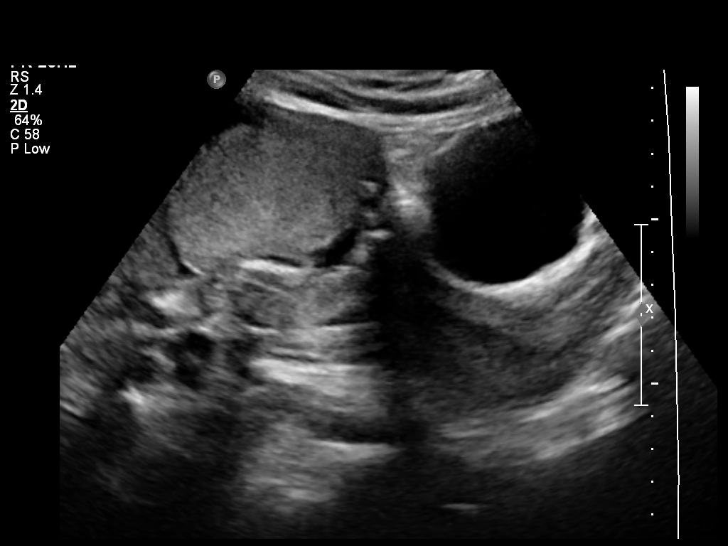
[im 6/47]
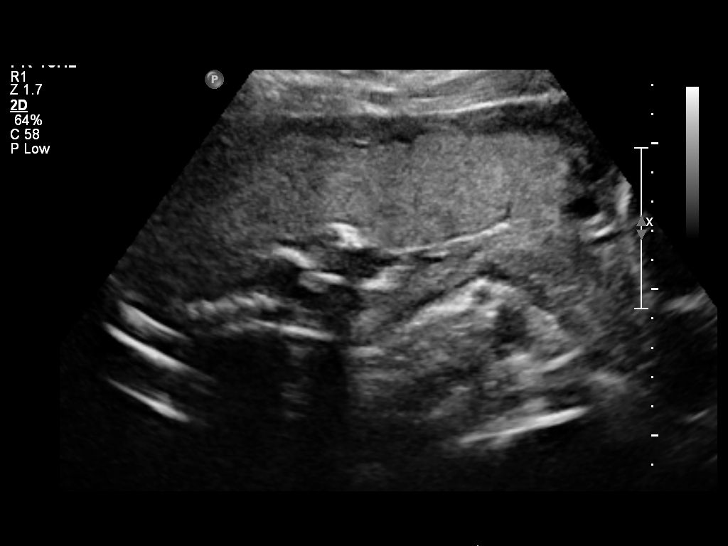
[im 9/47]
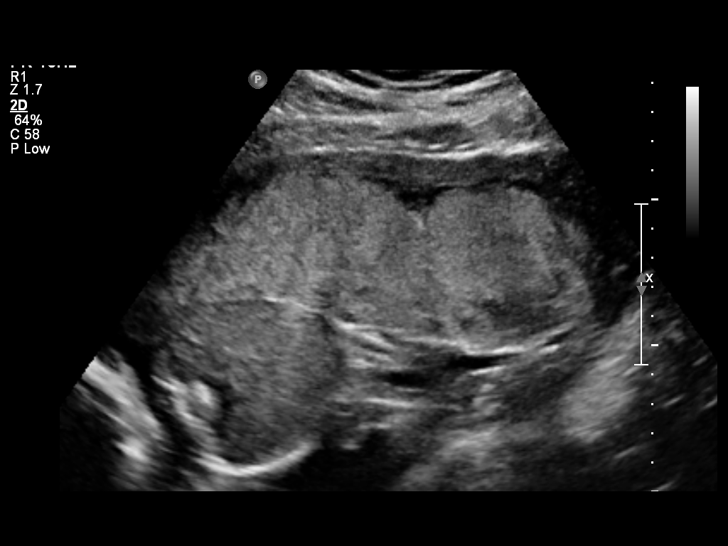
[im 14/47]
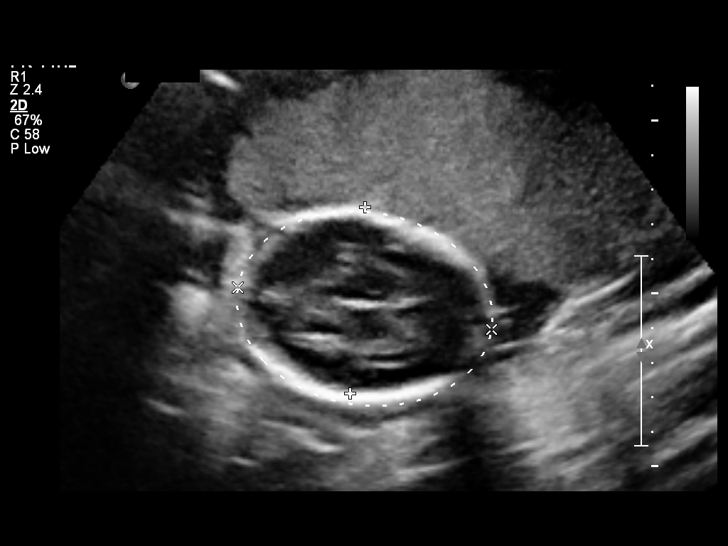
[im 18/47]
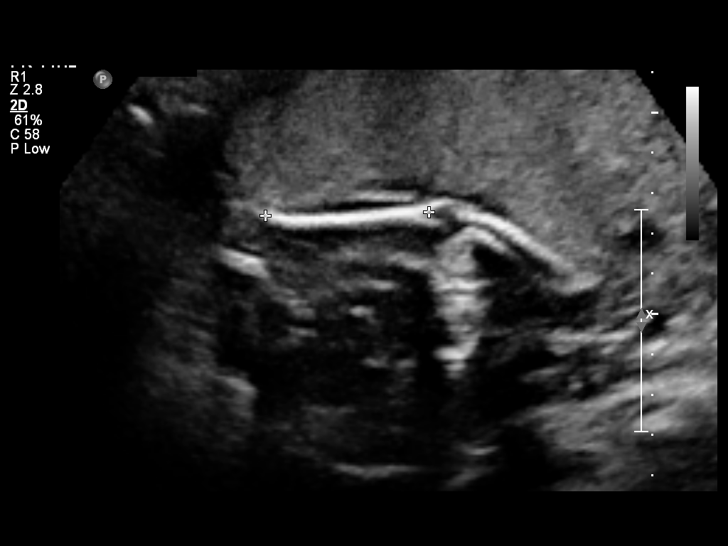
[im 21/47]
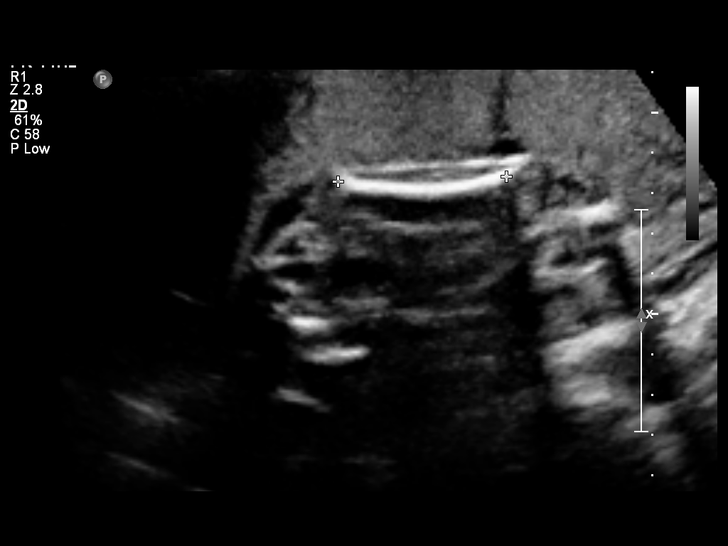
[im 26/47]
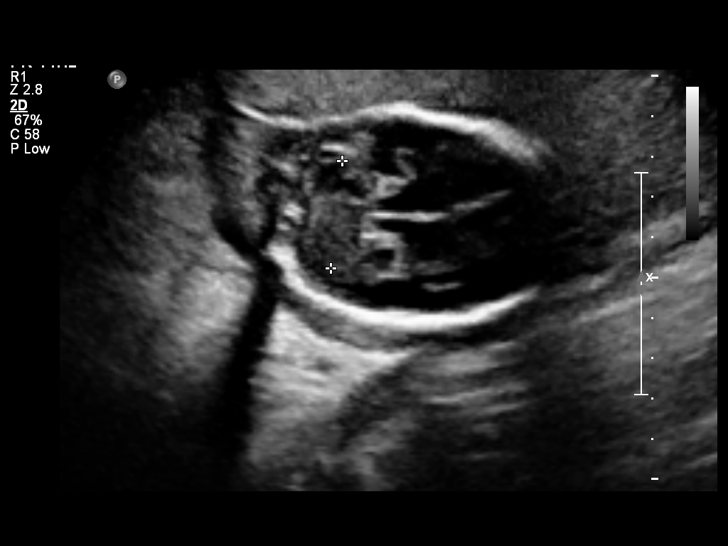
[im 29/47]
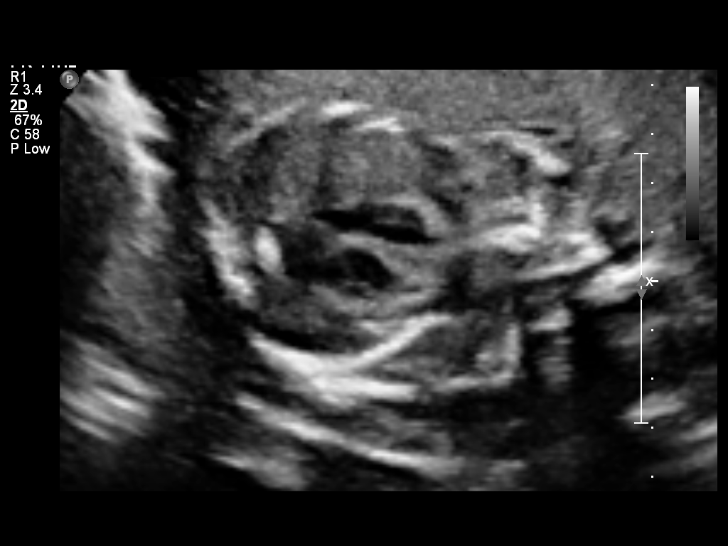
[im 33/47]
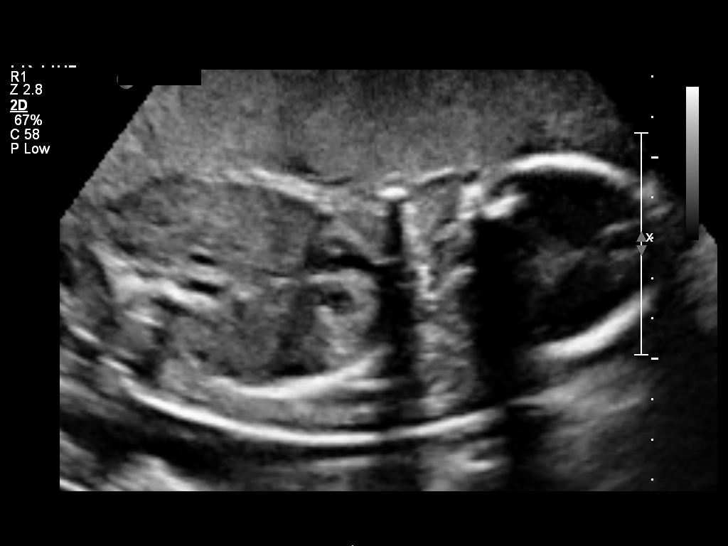
[im 38/47]
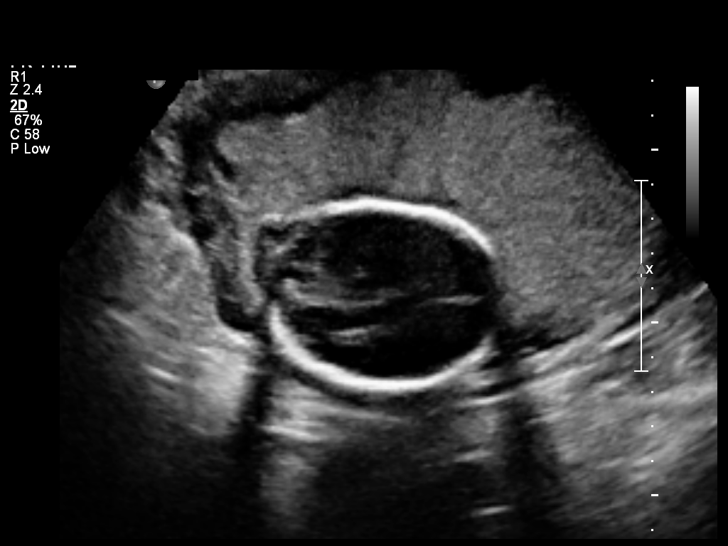
[im 41/47]
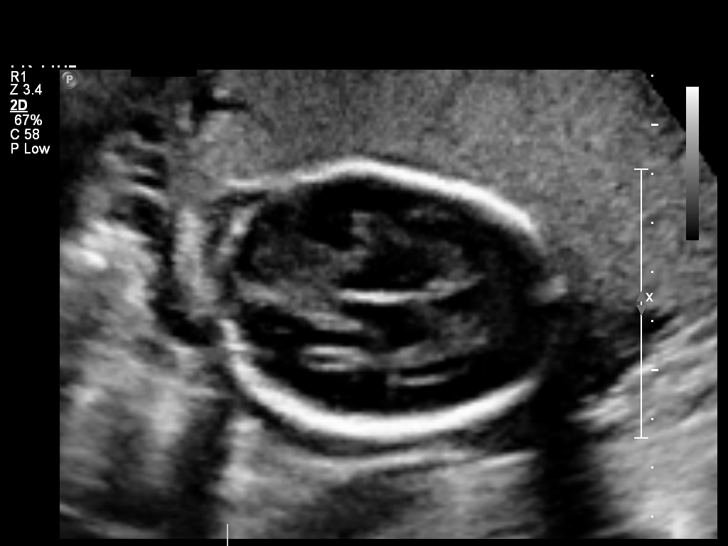
[im 45/47]
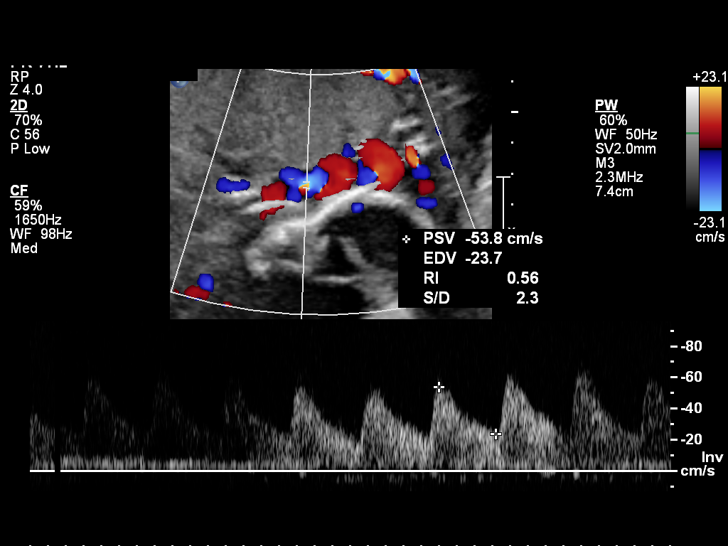

[12 of 28 positions shown; findings below may reference images not displayed]

OBSTETRICS REPORT
(Signed Final 07/29/2014 [DATE])

Service(s) Provided

US OB FOLLOW UP                                       76816.1
US UA CORD DOPPLER                                    76820.0
Indications

Obesity complicating pregnancy, second trimester
Poor obstetric history: Previous preeclampsia /
eclampsia/gestational HTN
Hypertension - Chronic/Pre-existing on labetalol
Previous cesarean section
26 weeks gestation of pregnancy
Placental abruption
Fetal Evaluation

Num Of Fetuses:    1
Fetal Heart Rate:  149                          bpm
Cardiac Activity:  Observed
Presentation:      Breech, footling
Placenta:          Anterior, above cervical os
P. Cord            Previously Visualized
Insertion:

Amniotic Fluid
AFI FV:      Severe oligohydramnios
AFI Sum:     0.6     cm      < 3  %Tile
Biometry

BPD:     53.5  mm     G. Age:  22w 2d                CI:        70.82   70 - 86
FL/HC:      21.2   18.6 -
20.4
HC:     202.6  mm     G. Age:  22w 3d      < 3  %    HC/AC:      1.02   1.04 -
1.22
AC:     198.8  mm     G. Age:  24w 4d        5  %    FL/BPD:     80.2   71 - 87
FL:      42.9  mm     G. Age:  24w 0d      < 3  %    FL/AC:      21.6   20 - 24
HUM:     41.7  mm     G. Age:  25w 1d       18  %
CER:     26.7  mm     G. Age:  24w 2d       11  %

Est. FW:     652  gm      1 lb 7 oz     15  %
Gestational Age

LMP:           29w 3d        Date:  01/04/14                 EDD:   10/11/14
U/S Today:     23w 2d                                        EDD:   11/23/14
Best:          26w 2d     Det. By:  Early Ultrasound         EDD:   11/02/14
Anatomy

Cranium:          Appears normal         Aortic Arch:      Not well visualized
Fetal Cavum:      Appears normal         Ductal Arch:      Not well visualized
Ventricles:       Appears normal         Diaphragm:        Appears normal
Choroid Plexus:   Previously seen        Stomach:          Appears normal, left
sided
Cerebellum:       Appears normal         Abdomen:          Appears normal
Posterior Fossa:  Not well visualized    Abdominal Wall:   Previously seen
Nuchal Fold:      Not applicable (>20    Cord Vessels:     Previously seen
wks GA)
Face:             Not well visualized    Kidneys:          Appear normal
Lips:             Not well visualized    Bladder:          Appears normal
Heart:            Appears normal         Spine:            Previously seen
(4CH, axis, and
situs)
RVOT:             Not well visualized    Lower             Previously seen
Extremities:
LVOT:             Not well visualized    Upper             Previously seen
Extremities:

Other:  Male prev seen.  Technically difficult due to low amniotic fluid.
Doppler - Fetal Vessels

Umbilical Artery
S/D:   2.9            35  %tile
Umbilical Artery
Absent DFV:    No     Reverse DFV:    No

Cervix Uterus Adnexa

Cervical Length:    5        cm

Cervix:       Normal appearance by transabdominal scan.
Left Ovary:    Not visualized.
Right Ovary:   Previously seen
Impression

SIUP at 26+2 weeks
Footling breech presentation
Normal interval anatomy; limited views of heart and face
Severe oligohydramnios (? PPROM vs low fluid from FGR)
EFW at the 15th %tile; AC at the 5th %tile; very little growth
since last US on [DATE]
UA dopplers were normal for this GA
Anterior placenta; no previa; no subchorionic fluid
collections/SCH identified
Recommendations

Continuous monitoring initially
Weekly BPPs, AFIs and UA dopplers
Growth in 2 weeks

questions or concerns.

## 2016-08-22 ENCOUNTER — Inpatient Hospital Stay (HOSPITAL_COMMUNITY)
Admission: AD | Admit: 2016-08-22 | Discharge: 2016-08-22 | Disposition: A | Discharge status: Home / Self Care | Payer: Medicaid Other | Source: Ambulatory Visit | Attending: Obstetrics and Gynecology | Admitting: Obstetrics and Gynecology

## 2016-08-22 ENCOUNTER — Encounter (HOSPITAL_COMMUNITY): Payer: Self-pay | Admitting: *Deleted

## 2016-08-22 DIAGNOSIS — F17210 Nicotine dependence, cigarettes, uncomplicated: Secondary | ICD-10-CM | POA: Insufficient documentation

## 2016-08-22 DIAGNOSIS — Z6841 Body Mass Index (BMI) 40.0 and over, adult: Secondary | ICD-10-CM | POA: Insufficient documentation

## 2016-08-22 DIAGNOSIS — Z800 Family history of malignant neoplasm of digestive organs: Secondary | ICD-10-CM | POA: Insufficient documentation

## 2016-08-22 DIAGNOSIS — Z8249 Family history of ischemic heart disease and other diseases of the circulatory system: Secondary | ICD-10-CM | POA: Insufficient documentation

## 2016-08-22 DIAGNOSIS — I10 Essential (primary) hypertension: Secondary | ICD-10-CM | POA: Insufficient documentation

## 2016-08-22 DIAGNOSIS — Z3202 Encounter for pregnancy test, result negative: Secondary | ICD-10-CM

## 2016-08-22 DIAGNOSIS — Z841 Family history of disorders of kidney and ureter: Secondary | ICD-10-CM | POA: Insufficient documentation

## 2016-08-22 DIAGNOSIS — Z833 Family history of diabetes mellitus: Secondary | ICD-10-CM | POA: Insufficient documentation

## 2016-08-22 DIAGNOSIS — Z9889 Other specified postprocedural states: Secondary | ICD-10-CM | POA: Insufficient documentation

## 2016-08-22 DIAGNOSIS — N939 Abnormal uterine and vaginal bleeding, unspecified: Secondary | ICD-10-CM | POA: Insufficient documentation

## 2016-08-22 DIAGNOSIS — J45909 Unspecified asthma, uncomplicated: Secondary | ICD-10-CM | POA: Insufficient documentation

## 2016-08-22 DIAGNOSIS — N920 Excessive and frequent menstruation with regular cycle: Secondary | ICD-10-CM

## 2016-08-22 DIAGNOSIS — Z882 Allergy status to sulfonamides status: Secondary | ICD-10-CM | POA: Insufficient documentation

## 2016-08-22 DIAGNOSIS — Z8619 Personal history of other infectious and parasitic diseases: Secondary | ICD-10-CM | POA: Insufficient documentation

## 2016-08-22 DIAGNOSIS — E669 Obesity, unspecified: Secondary | ICD-10-CM | POA: Insufficient documentation

## 2016-08-22 DIAGNOSIS — Z832 Family history of diseases of the blood and blood-forming organs and certain disorders involving the immune mechanism: Secondary | ICD-10-CM | POA: Insufficient documentation

## 2016-08-22 DIAGNOSIS — Z91018 Allergy to other foods: Secondary | ICD-10-CM | POA: Insufficient documentation

## 2016-08-22 DIAGNOSIS — E739 Lactose intolerance, unspecified: Secondary | ICD-10-CM | POA: Insufficient documentation

## 2016-08-22 LAB — POCT PREGNANCY, URINE: Preg Test, Ur: NEGATIVE

## 2016-08-22 LAB — URINALYSIS, ROUTINE W REFLEX MICROSCOPIC
Bilirubin Urine: NEGATIVE
GLUCOSE, UA: NEGATIVE mg/dL
KETONES UR: NEGATIVE mg/dL
Nitrite: NEGATIVE
PH: 5 (ref 5.0–8.0)
Protein, ur: 30 mg/dL — AB
SPECIFIC GRAVITY, URINE: 1.029 (ref 1.005–1.030)

## 2016-08-22 NOTE — MAU Note (Signed)
PT  SAYS LMP WAS 5-18          SPOTTING STARTED  THIS  AFTERNOON .  SHE DID HPT ON Friday - POSITIVE.   NO BIRTH CONTROL. LAST SEX-   TODAY   FEELS MILD  CRAMPS.       PAD ON IN TRIAGE - NOTHING PER PT.

## 2016-08-22 NOTE — MAU Provider Note (Signed)
[facility] Attending Note  History     CSN: [id number]  Arrival date & time 08/22/16  1952   First Provider Initiated Contact with Patient 08/22/16 2139      Chief Complaint  Patient presents with  . Vaginal Bleeding    Judy Wood is a 21 y.o. [id number] who presents to MAU today for evaluation of spotting.  No abdominal pain, no ongoing bleeding currently. Took two home UPT that were faintly positive. Denies any abnormal vaginal discharge, fevers, chills, sweats, dysuria, nausea, vomiting, other GI or GU symptoms or other general symptoms.   Obstetric History   G2   P2   T2   P0   A0   L2    SAB0   TAB0   Ectopic0   Multiple0   Live Births2     # Outcome Date GA Lbr Len/2nd Weight Sex Delivery Anes PTL Lv  2 Term 10/13/14 [redacted]w[redacted]d 07:45 / 00:06 5 lb 9.4 oz (2.534 kg) M Vag-Spont EPI  LIV     Name: Mom,BOY Ayelen     Apgar1:  9                Apgar5: 9  1 Term 05/19/12 [redacted]w[redacted]d  6 lb 5.9 oz (2.889 kg) F CS-LTranv EPI  LIV     Name: Rosengren,GIRL Reniya     Apgar1:  4                Apgar5: 5      Past Medical History:  Diagnosis Date  . Asthma    pt states she took albuterol inhaler lately  . Depression   . H/O umbilical hernia repair   . Hernia, umbilical   . Hypertension    on meds  . Hypertension   . Obesity   . Syphilis 08/30/2014   Treated    Past Surgical History:  Procedure Laterality Date  . CESAREAN SECTION N/A 05/19/2012   Procedure:  Primary CESAREAN SECTION  of baby girl  at 1933  APGAR 4/5/5;  Surgeon: Adam Phenix, MD;  Location: [facility];  Service: Obstetrics;  Laterality: N/A;  . UMBILICAL HERNIA REPAIR     as a child  . UMBILICAL HERNIA REPAIR      Family History  Problem Relation Age of Onset  . Hypertension Mother   . Anemia Mother   . Depression Mother   . Deep vein thrombosis Maternal Grandmother   . Diabetes Maternal Grandmother   . Kidney disease Maternal Grandmother   . Cancer Maternal Grandmother        colon cancer  .  Diabetes Father     Social History  Substance Use Topics  . Smoking status: Current Every Day Smoker    Packs/day: 0.50    Types: Cigarettes  . Smokeless tobacco: Never Used  . Alcohol use Yes    Allergies  Allergen Reactions  . Banana Anaphylaxis  . Lactose Intolerance (Gi) Diarrhea and Nausea Only  . Sulfa Antibiotics Other (See Comments)    Childhood reaction    Prescriptions Prior to Admission  Medication Sig Dispense Refill Last Dose  . hydrochlorothiazide (HYDRODIURIL) 25 MG tablet Take 1 tablet (25 mg total) by mouth daily. 30 tablet 2   . labetalol (NORMODYNE) 100 MG tablet Take 1 tablet (100 mg total) by mouth 2 (two) times daily. (Patient not taking: Reported on 12/30/2015) 60 tablet 1 Not Taking  . norgestimate-ethinyl estradiol (ORTHO-CYCLEN,SPRINTEC,PREVIFEM) 0.25-35 MG-MCG tablet Take 1 tablet by  mouth daily. 1 Package 11 Taking  . prenatal vitamin w/FE, FA (PRENATAL 1 + 1) 27-1 MG TABS tablet Take 1 tablet by mouth daily at 12 noon.   Taking     Physical Exam  BP (!) 145/109 (BP Location: Right Arm) Comment: TAKES LABETALOL-   AS PRESCRIBED  Pulse 84   Temp 98.2 F (36.8 C) (Oral)   Resp 18   Ht 5\' 4"  (1.626 m)   Wt 276 lb 4 oz (125.3 kg)   LMP 07/15/2016   BMI 47.42 kg/m  GENERAL: Well-developed, well-nourished female in no acute distress  SKIN: Warm, dry and without erythema PSYCH: Normal mood and affect HEENT: Normocephalic, atraumatic.   LUNGS: Normal respiratory effort, normal breath sounds HEART: Regular rate noted ABDOMEN: Soft, nondistended, nontender, obese PELVIC: Deferred EXTREMITIES: No edema, no cyanosis, normal range of movement  MAU Course/MDM  No intervention  Labs and Imaging   Results for orders placed or performed during the hospital encounter of 08/22/16 (from the past 24 hour(s))  Urinalysis, Routine w reflex microscopic     Status: Abnormal   Collection Time: 08/22/16  8:46 PM  Result Value Ref Range   Color, Urine YELLOW  YELLOW   APPearance HAZY (A) CLEAR   Specific Gravity, Urine 1.029 1.005 - 1.030   pH 5.0 5.0 - 8.0   Glucose, UA NEGATIVE NEGATIVE mg/dL   Hgb urine dipstick LARGE (A) NEGATIVE   Bilirubin Urine NEGATIVE NEGATIVE   Ketones, ur NEGATIVE NEGATIVE mg/dL   Protein, ur 30 (A) NEGATIVE mg/dL   Nitrite NEGATIVE NEGATIVE   Leukocytes, UA SMALL (A) NEGATIVE   RBC / HPF TOO NUMEROUS TO COUNT 0 - 5 RBC/hpf   WBC, UA 6-30 0 - 5 WBC/hpf   Bacteria, UA RARE (A) NONE SEEN   Squamous Epithelial / LPF 0-5 (A) NONE SEEN   Mucous PRESENT   Pregnancy, urine POC     Status: None   Collection Time: 08/22/16  8:53 PM  Result Value Ref Range   Preg Test, Ur NEGATIVE NEGATIVE   No results found.  Assessment and Plan   1. Spotting   2. Pregnancy examination or test, negative result     Patient will follow up in [facility] later this week as scheduled Continue BP medications Was told to return to MAU for any pain, bleeding or other concerns, or if her condition were to change or worsen.  Discharged to home in stable condition    Allergies as of 08/22/2016      Reactions   Banana Anaphylaxis   Lactose Intolerance (gi) Diarrhea, Nausea Only   Sulfa Antibiotics Other (See Comments)   Childhood reaction      Medication List    STOP taking these medications   norgestimate-ethinyl estradiol 0.25-35 MG-MCG tablet Commonly known as:  ORTHO-CYCLEN,SPRINTEC,PREVIFEM     TAKE these medications   hydrochlorothiazide 25 MG tablet Commonly known as:  HYDRODIURIL Take 1 tablet (25 mg total) by mouth daily.   labetalol 100 MG tablet Commonly known as:  NORMODYNE Take 1 tablet (100 mg total) by mouth 2 (two) times daily.   prenatal vitamin w/FE, FA 27-1 MG Tabs tablet Take 1 tablet by mouth daily at 12 noon.        Jaynie CollinsUGONNA  ANYANWU, MD, FACOG Attending Obstetrician & Gynecologist, [facility], [facility]

## 2016-08-22 NOTE — Discharge Instructions (Signed)
Abnormal Uterine Bleeding Abnormal uterine bleeding can affect women at various stages in life, including teenagers, women in their reproductive years, pregnant women, and women who have reached menopause. Several kinds of uterine bleeding are considered abnormal, including:  Bleeding or spotting between periods.  Bleeding after sexual intercourse.  Bleeding that is heavier or more than normal.  Periods that last longer than usual.  Bleeding after menopause. Many cases of abnormal uterine bleeding are minor and simple to treat, while others are more serious. Any type of abnormal bleeding should be evaluated by your health care provider. Treatment will depend on the cause of the bleeding. Follow these instructions at home: Monitor your condition for any changes. The following actions may help to alleviate any discomfort you are experiencing:  Avoid the use of tampons and douches as directed by your health care provider.  Change your pads frequently. You should get regular pelvic exams and Pap tests. Keep all follow-up appointments for diagnostic tests as directed by your health care provider. Contact a health care provider if:  Your bleeding lasts more than 1 week.  You feel dizzy at times. Get help right away if:  You pass out.  You are changing pads every 15 to 30 minutes.  You have abdominal pain.  You have a fever.  You become sweaty or weak.  You are passing large blood clots from the vagina.  You start to feel nauseous and vomit. This information is not intended to replace advice given to you by your health care provider. Make sure you discuss any questions you have with your health care provider. Document Released: 02/14/2005 Document Revised: 07/29/2015 Document Reviewed: 09/13/2012 Elsevier Interactive Patient Education  2017 [employer] Inc[employer]  

## 2016-08-23 ENCOUNTER — Telehealth: Payer: Self-pay | Admitting: Obstetrics and Gynecology

## 2016-08-23 NOTE — Telephone Encounter (Signed)
Called patient she stated she is bleeding heavy at this time and she feels like her blood pressure is elevated today. She reports taking pregnancy yesterday and it was positive today she took one and its negative.Patient is going through 1-2 pads per hour. I advised patient to go directly to MAU to be check out. Patient reports no pain just the heavy bleeding at this time.

## 2016-08-23 NOTE — Telephone Encounter (Signed)
Have a Pregnancy Test Appt on this Thursday, she woke up this morning and she's having some spotting, would like a call back

## 2016-08-25 ENCOUNTER — Ambulatory Visit: Payer: Self-pay

## 2016-09-03 IMAGING — US US OB FOLLOW-UP
1 series · 12 of 28 positions shown · non-contrast
Comparison: none

[Series 1: us ob follow-up · 0.31mm/px · 32 acquisitions, 12 frames shown]
[im 2/32]
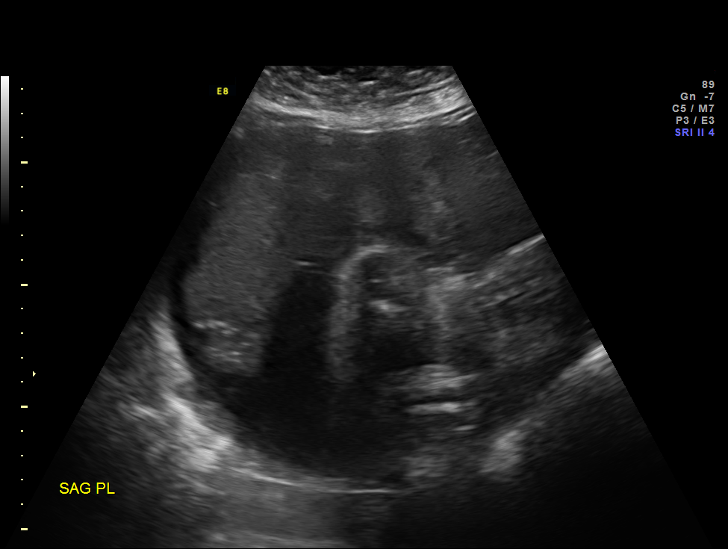
[im 4/32]
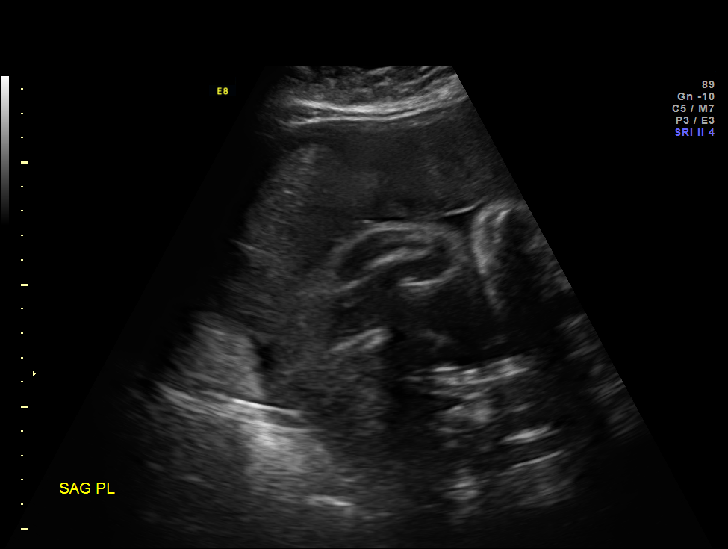
[im 6/32]
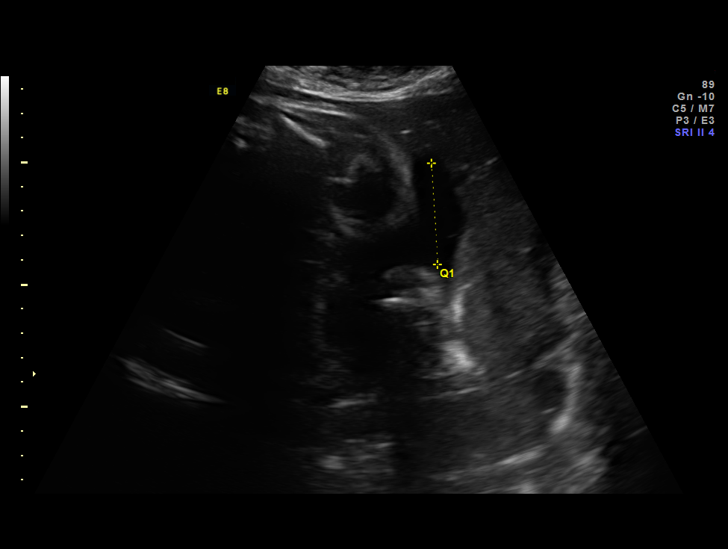
[im 10/32]
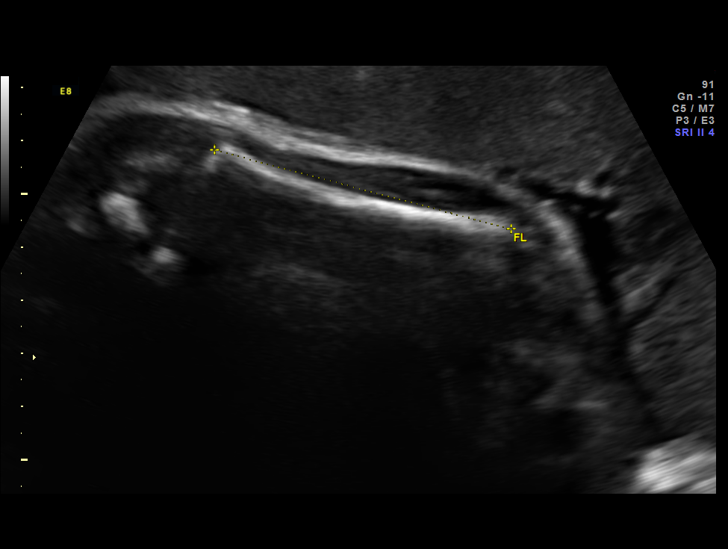
[im 12/32]
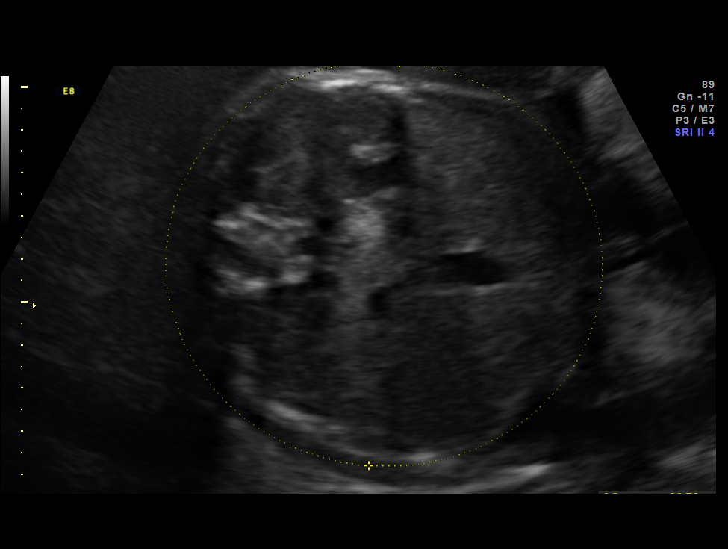
[im 14/32]
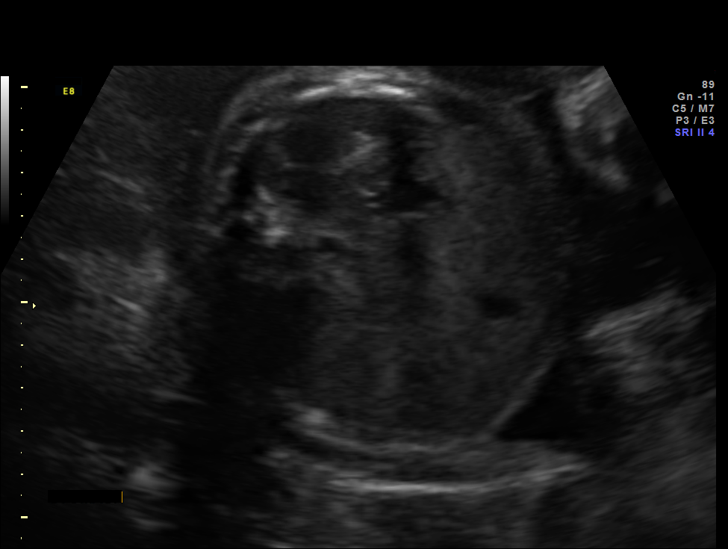
[im 18/32]
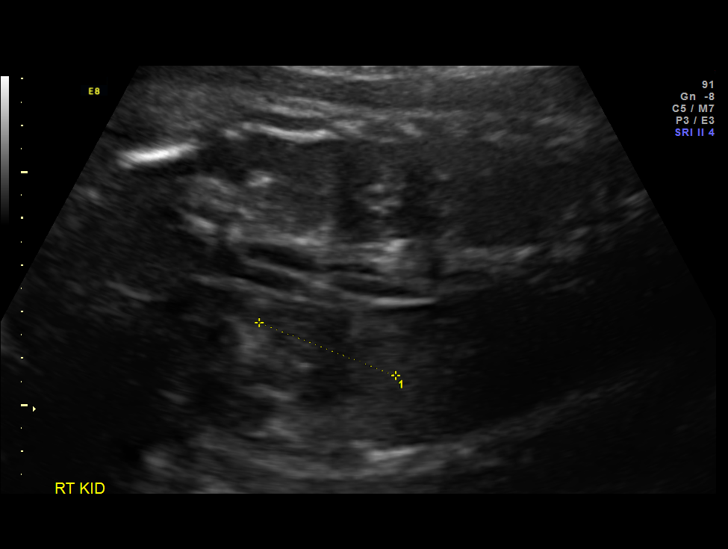
[im 20/32]
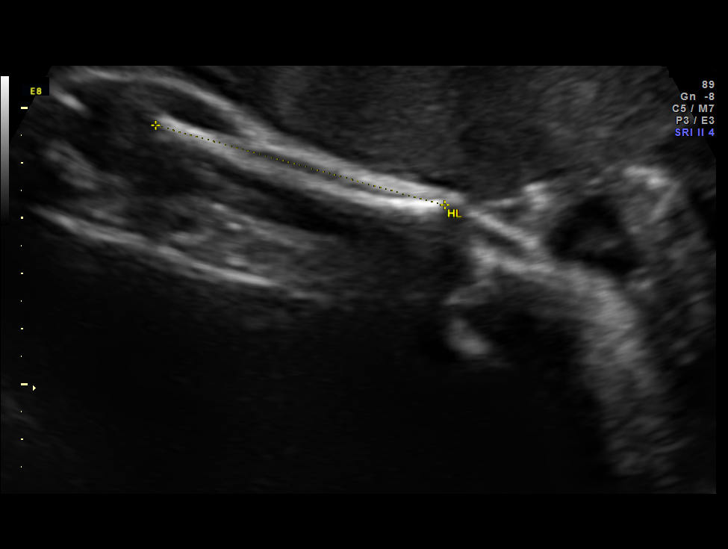
[im 22/32]
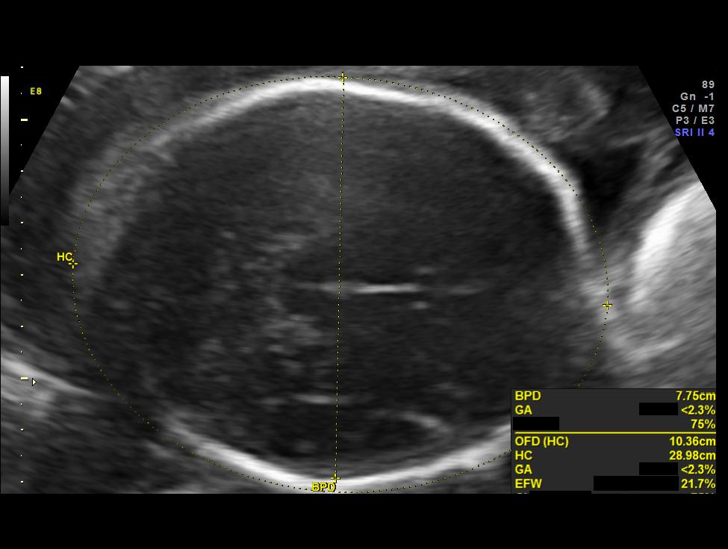
[im 26/32]
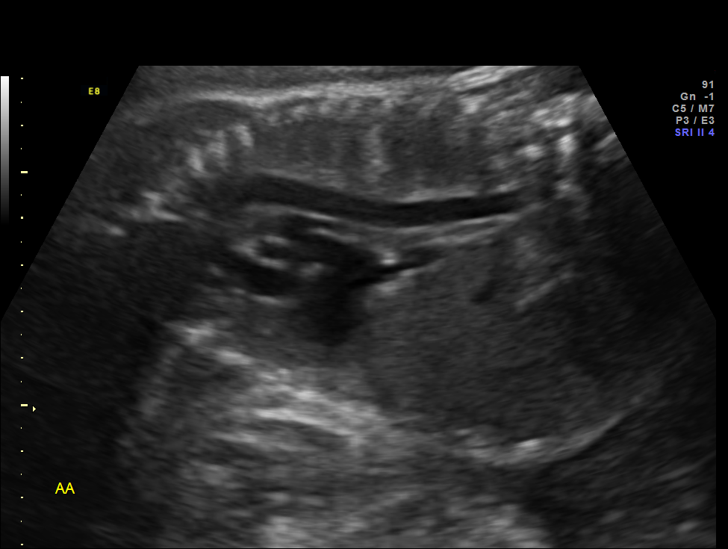
[im 28/32]
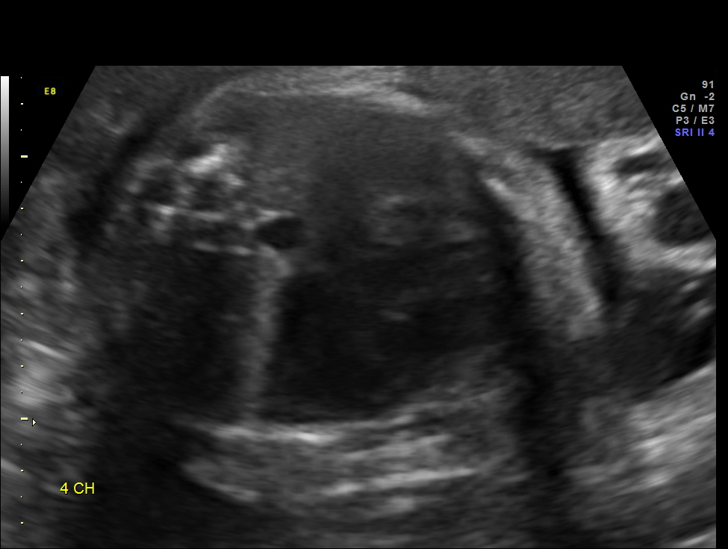
[im 30/32]
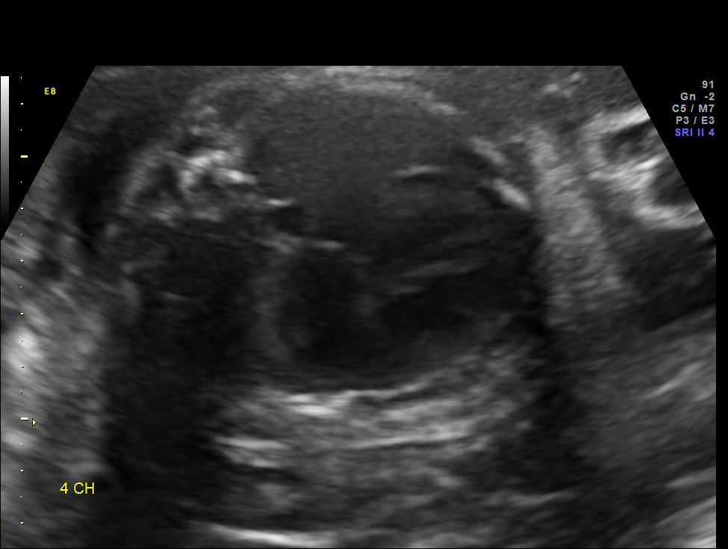

[12 of 28 positions shown; findings below may reference images not displayed]

OBSTETRICS REPORT
(Signed Final 09/25/2014 [DATE])

Name:       WAQAS PIE                         Visit  09/25/2014 [DATE]
Date:

Service(s) Provided

US OB FOLLOW UP                                        76816.1
Indications

Poor obstetric history: Previous preeclampsia /
eclampsia/gestational HTN
Hypertension - Chronic/Pre-existing on labetalol
Previous cesarean section
Placental abruption - clinically
Obesity complicating pregnancy, third trimester
Premature rupture of membranes - PPROM (now
with negative Amnisure)
34 weeks gestation of pregnancy
Fetal Evaluation

Num Of             1
Fetuses:
Fetal Heart        129                          bpm
Rate:
Cardiac Activity:  Observed
Presentation:      Cephalic
Placenta:          Anterior, above cervical
os
P. Cord            Visualized, central
Insertion:

Amniotic Fluid
AFI FV:      Subjectively within normal limits
AFI Sum:     15.48    cm      56  %Tile     Larg Pckt:    6.74   cm
RUQ:   4.16    cm    RLQ:   6.74    cm   LUQ:    2.23    cm   LLQ:    2.35   cm
Biometry

BPD:     77.7   m    G. Age:   31w 1d                 CI:         77.8   70 - 86
m
OFD:     99.9   m                                     FL/HC:      20.5   20.1 -
m
HC:     284.4   m    G. Age:   31w 2d       < 3  %    HC/AC:      0.94   0.93 -
m
AC:       301   m    G. Age:   34w 1d        41  %    FL/BPD      75.2   71 - 87
m                                     :
FL:      58.4   m    G. Age:   30w 4d       < 3  %    FL/AC:      19.4   20 - 24
m
HUM:     54.3   m    G. Age:   31w 4d       < 5  %
m

Est.        7730   gm    4 lb 6 oz      22   %
FW:
Gestational Age

LMP:           37w 5d        Date:  01/04/14                  EDD:   10/11/14
U/S Today:     31w 5d                                         EDD:   11/22/14
Best:          34w 4d    Det. By:   Early Ultrasound          EDD:   11/02/14
Anatomy

Cranium:          Previously seen        Aortic Arch:       Not well visualized
Fetal Cavum:      Previously seen        Ductal Arch:       Not well visualized
Ventricles:       Appears normal         Diaphragm:         Appears normal
Choroid Plexus:   Previously seen        Stomach:           Appears normal,
left sided
Cerebellum:       Previously seen        Abdomen:           Previously seen
Posterior         Not well visualized    Abdominal          Previously seen
Fossa:                                   Wall:
Nuchal Fold:      Not applicable (>20    Cord Vessels:      Previously seen
wks GA)
Face:             Orbits and profile     Kidneys:           Appear normal
previously seen
Lips:             Previously seen        Bladder:           Appears normal
Heart:            Appears normal         Spine:             Previously seen
(4CH, axis, and
situs)
RVOT:             Previously seen        Lower              Previously seen
Extremities:
LVOT:             Previously seen        Upper              Previously seen
Extremities:

Other:   Male prev seen.  Technically difficult due to  maternal habitus.
Cervix Uterus Adnexa

Cervix:       Not visualized (advanced GA >86wks)
Impression

Single IUP at 34w 4d
Patient previously admitted for suspecte PROM -
oligohydramnios has since resolved
The estimated fetal weight today is at the 22nd %tile.
The HC meassures < 3rd %tile (
 2SD below the mean).
Normal cranial anatomy was previously noted.
The HL and FL measure < 5th %tile, but appear normal in
morphology - feel skeletal dysplasia is unlikely
Normal amniotic fluid volume
Recommendations

Continue antenatal testing as scheduled
Patient is tentatively scheduled for delivery at 37 weeks
Follow up ultrasound as clinically indicated

## 2016-12-15 ENCOUNTER — Ambulatory Visit (HOSPITAL_COMMUNITY)
Admission: EM | Admit: 2016-12-15 | Discharge: 2016-12-15 | Disposition: A | Discharge status: Home / Self Care | Payer: Self-pay | Attending: Emergency Medicine | Admitting: Emergency Medicine

## 2016-12-15 ENCOUNTER — Encounter (HOSPITAL_COMMUNITY): Payer: Self-pay | Admitting: *Deleted

## 2016-12-15 DIAGNOSIS — E669 Obesity, unspecified: Secondary | ICD-10-CM | POA: Insufficient documentation

## 2016-12-15 DIAGNOSIS — Z113 Encounter for screening for infections with a predominantly sexual mode of transmission: Secondary | ICD-10-CM

## 2016-12-15 DIAGNOSIS — J45909 Unspecified asthma, uncomplicated: Secondary | ICD-10-CM | POA: Insufficient documentation

## 2016-12-15 DIAGNOSIS — F17210 Nicotine dependence, cigarettes, uncomplicated: Secondary | ICD-10-CM | POA: Insufficient documentation

## 2016-12-15 DIAGNOSIS — I10 Essential (primary) hypertension: Secondary | ICD-10-CM | POA: Insufficient documentation

## 2016-12-15 DIAGNOSIS — N390 Urinary tract infection, site not specified: Secondary | ICD-10-CM | POA: Insufficient documentation

## 2016-12-15 DIAGNOSIS — Z79899 Other long term (current) drug therapy: Secondary | ICD-10-CM | POA: Insufficient documentation

## 2016-12-15 DIAGNOSIS — F329 Major depressive disorder, single episode, unspecified: Secondary | ICD-10-CM | POA: Insufficient documentation

## 2016-12-15 DIAGNOSIS — B948 Sequelae of other specified infectious and parasitic diseases: Secondary | ICD-10-CM | POA: Insufficient documentation

## 2016-12-15 DIAGNOSIS — Z202 Contact with and (suspected) exposure to infections with a predominantly sexual mode of transmission: Secondary | ICD-10-CM | POA: Insufficient documentation

## 2016-12-15 LAB — POCT URINALYSIS DIP (DEVICE)
Bilirubin Urine: NEGATIVE
Glucose, UA: NEGATIVE mg/dL
KETONES UR: NEGATIVE mg/dL
Nitrite: POSITIVE — AB
PH: 6.5 (ref 5.0–8.0)
PROTEIN: NEGATIVE mg/dL
Specific Gravity, Urine: 1.025 (ref 1.005–1.030)
Urobilinogen, UA: 0.2 mg/dL (ref 0.0–1.0)

## 2016-12-15 LAB — POCT PREGNANCY, URINE: Preg Test, Ur: NEGATIVE

## 2016-12-15 MED ORDER — CEFTRIAXONE SODIUM 250 MG IJ SOLR
INTRAMUSCULAR | Status: AC
  Filled 2016-12-15: qty 250

## 2016-12-15 MED ORDER — AZITHROMYCIN 250 MG PO TABS
ORAL_TABLET | ORAL | Status: AC
  Filled 2016-12-15: qty 4

## 2016-12-15 MED ORDER — NITROFURANTOIN MONOHYD MACRO 100 MG PO CAPS: 100.0000 mg | ORAL_CAPSULE | Freq: Two times a day (BID) | ORAL | 0 refills | Status: DC

## 2016-12-15 MED ORDER — AZITHROMYCIN 250 MG PO TABS
1000.0000 mg | ORAL_TABLET | Freq: Once | ORAL | Status: AC
  Administered 2016-12-15: 1000 mg via ORAL

## 2016-12-15 MED ORDER — CEFTRIAXONE SODIUM 250 MG IJ SOLR
250.0000 mg | Freq: Once | INTRAMUSCULAR | Status: AC
  Administered 2016-12-15: 250 mg via INTRAMUSCULAR

## 2016-12-15 NOTE — ED Provider Notes (Signed)
[facility]    CSN: [id number] Arrival date & time: 12/15/16  1024     History   Chief Complaint Chief Complaint  Patient presents with  . Exposure to STD    HPI Judy Wood is a 21 y.o. female.   Judy Wood presents today with complaints of concern for std's. She has had unprotected sex with her boyfriend, who had recently had unprotected sex with others while they were briefly separated. She has had sti's in the past without knowing, therefore requests screening. Denies vaginal discharge or itching. She states she does have mild burning with urination. Denies urinary frequency or back pain. Occasionally at night she notices pelvic discomfort, denies currently. She is not on birth control. Denies current pain. No fevers. She does have a history of elevated blood pressure but has not recently been on medications for this, requests information for community health center to establish care. Denies headaches, dizziness or neurological complaints.       Past Medical History:  Diagnosis Date  . Asthma    pt states she took albuterol inhaler lately  . Depression   . H/O umbilical hernia repair   . Hernia, umbilical   . Hypertension    on meds  . Hypertension   . Obesity   . Syphilis 08/30/2014   Treated    Patient Active Problem List   Diagnosis Date Noted  . Depression 12/30/2015  . Well woman exam with routine gynecological exam 12/30/2015  . Breast screening 12/30/2015  . History of syphilis 11/24/2014  . Obesity 11/24/2014  . Hypertension 10/17/2011    Past Surgical History:  Procedure Laterality Date  . CESAREAN SECTION N/A 05/19/2012   Procedure:  Primary CESAREAN SECTION  of baby girl  at 1933  APGAR 4/5/5;  Surgeon: Adam PhenixJames G Arnold, MD;  Location: [facility];  Service: Obstetrics;  Laterality: N/A;  . UMBILICAL HERNIA REPAIR     as a child  . UMBILICAL HERNIA REPAIR      OB History    Gravida Para Term Preterm AB Living   2 2 2     2    SAB TAB Ectopic  Multiple Live Births         0 2       Home Medications    Prior to Admission medications   Medication Sig Start Date End Date Taking? Authorizing Provider  hydrochlorothiazide (HYDRODIURIL) 25 MG tablet Take 1 tablet (25 mg total) by mouth daily. 12/30/15   Rasch, Victorino DikeJennifer I, NP  labetalol (NORMODYNE) 100 MG tablet Take 1 tablet (100 mg total) by mouth 2 (two) times daily. Patient not taking: Reported on 12/30/2015 10/06/14   Levie HeritageStinson, Jacob J, DO  nitrofurantoin, macrocrystal-monohydrate, (MACROBID) 100 MG capsule Take 1 capsule (100 mg total) by mouth 2 (two) times daily. 12/15/16   Georgetta HaberBurky, Natalie B, NP  prenatal vitamin w/FE, FA (PRENATAL 1 + 1) 27-1 MG TABS tablet Take 1 tablet by mouth daily at 12 noon.    [provider]    Family History Family History  Problem Relation Age of Onset  . Hypertension Mother   . Anemia Mother   . Depression Mother   . Deep vein thrombosis Maternal Grandmother   . Diabetes Maternal Grandmother   . Kidney disease Maternal Grandmother   . Cancer Maternal Grandmother        colon cancer  . Diabetes Father     Social History Social History  Substance Use Topics  . Smoking status:  Current Every Day Smoker    Packs/day: 0.50    Types: Cigarettes  . Smokeless tobacco: Never Used  . Alcohol use Yes     Allergies   Banana; Lactose intolerance (gi); and Sulfa antibiotics   Review of Systems Review of Systems  Constitutional: Negative.   Gastrointestinal: Negative.   Genitourinary: Positive for dysuria. Negative for flank pain, frequency, genital sores, hematuria, pelvic pain, urgency, vaginal bleeding, vaginal discharge and vaginal pain.     Physical Exam Triage Vital Signs ED Triage Vitals [12/15/16 1151]  Enc Vitals Group     BP (!) 171/121     Pulse Rate 81     Resp 16     Temp 98.7 F (37.1 C)     Temp Source Oral     SpO2 100 %     Weight      Height      Head Circumference      Peak Flow      Pain Score       Pain Loc      Pain Edu?      Excl. in GC?    No data found.   Updated Vital Signs BP (!) 171/121 (BP Location: Left Arm)   Pulse 81   Temp 98.7 F (37.1 C) (Oral)   Resp 16   LMP 12/11/2016   SpO2 100%   Visual Acuity Right Eye Distance:   Left Eye Distance:   Bilateral Distance:    Right Eye Near:   Left Eye Near:    Bilateral Near:     Physical Exam  Constitutional: She is oriented to person, place, and time. She appears well-developed and well-nourished.  Cardiovascular: Normal rate and regular rhythm.   Pulmonary/Chest: Effort normal and breath sounds normal.  Abdominal: Soft. There is no tenderness. There is no rebound and no guarding.  Neurological: She is alert and oriented to person, place, and time.     UC Treatments / Results  Labs (all labs ordered are listed, but only abnormal results are displayed) Labs Reviewed  POCT URINALYSIS DIP (DEVICE) - Abnormal; Notable for the following:       Result Value   Hgb urine dipstick TRACE (*)    Nitrite POSITIVE (*)    Leukocytes, UA SMALL (*)    All other components within normal limits  URINE CULTURE  POCT PREGNANCY, URINE  URINE CYTOLOGY ANCILLARY ONLY    EKG  EKG Interpretation None       Radiology No results found.  Procedures Procedures (including critical care time)  Medications Ordered in UC Medications  azithromycin (ZITHROMAX) tablet 1,000 mg (1,000 mg Oral Given 12/15/16 1239)  cefTRIAXone (ROCEPHIN) injection 250 mg (250 mg Intramuscular Given 12/15/16 1239)     Initial Impression / Assessment and Plan / UC Course  I have reviewed the triage vital signs and the nursing notes.  Pertinent labs & imaging results that were available during my care of the patient were reviewed by me and considered in my medical decision making (see chart for details).     Patient requests STI treatment today, provided. Urine results and dysuria concerning for UTI, antibiotics initiated at this time,  culture sent. Patient states her partner is also being tested for STI's, they have recently began using condoms. Reiterated safe sex practices. Patient agreeable to follow up with PCP for blood pressure recheck, contact information provided. All questions answered, patient verbalized understanding of instructions and agreeable to plan .   Final Clinical  Impressions(s) / UC Diagnoses   Final diagnoses:  Possible exposure to STD  Urinary tract infection without hematuria, site unspecified    New Prescriptions Discharge Medication List as of 12/15/2016 12:24 PM    START taking these medications   Details  nitrofurantoin, macrocrystal-monohydrate, (MACROBID) 100 MG capsule Take 1 capsule (100 mg total) by mouth 2 (two) times daily., Starting Thu 12/15/2016, Normal         Controlled Substance Prescriptions [place] Controlled Substance Registry consulted? Not Applicable   Georgetta Haber, NP 12/15/16 1248

## 2016-12-15 NOTE — ED Notes (Signed)
Dirty urine in lab

## 2016-12-15 NOTE — ED Triage Notes (Signed)
Patient reports she is sexually active and does not use condom, would like to be tested and treated for STDs.

## 2016-12-16 LAB — URINE CYTOLOGY ANCILLARY ONLY
Chlamydia: POSITIVE — AB
Neisseria Gonorrhea: NEGATIVE
TRICH (WINDOWPATH): NEGATIVE

## 2016-12-19 LAB — URINE CYTOLOGY ANCILLARY ONLY: CANDIDA VAGINITIS: NEGATIVE

## 2017-11-16 ENCOUNTER — Ambulatory Visit (HOSPITAL_COMMUNITY)
Admission: EM | Admit: 2017-11-16 | Discharge: 2017-11-16 | Disposition: A | Discharge status: Home / Self Care | Payer: Medicaid Other | Attending: Family Medicine | Admitting: Family Medicine

## 2017-11-16 ENCOUNTER — Other Ambulatory Visit: Payer: Self-pay

## 2017-11-16 DIAGNOSIS — N898 Other specified noninflammatory disorders of vagina: Secondary | ICD-10-CM

## 2017-11-16 DIAGNOSIS — Z882 Allergy status to sulfonamides status: Secondary | ICD-10-CM | POA: Insufficient documentation

## 2017-11-16 DIAGNOSIS — F329 Major depressive disorder, single episode, unspecified: Secondary | ICD-10-CM | POA: Insufficient documentation

## 2017-11-16 DIAGNOSIS — F17210 Nicotine dependence, cigarettes, uncomplicated: Secondary | ICD-10-CM | POA: Insufficient documentation

## 2017-11-16 DIAGNOSIS — N939 Abnormal uterine and vaginal bleeding, unspecified: Secondary | ICD-10-CM | POA: Insufficient documentation

## 2017-11-16 DIAGNOSIS — Z79899 Other long term (current) drug therapy: Secondary | ICD-10-CM | POA: Insufficient documentation

## 2017-11-16 DIAGNOSIS — J45909 Unspecified asthma, uncomplicated: Secondary | ICD-10-CM | POA: Insufficient documentation

## 2017-11-16 DIAGNOSIS — Z113 Encounter for screening for infections with a predominantly sexual mode of transmission: Secondary | ICD-10-CM

## 2017-11-16 DIAGNOSIS — Z202 Contact with and (suspected) exposure to infections with a predominantly sexual mode of transmission: Secondary | ICD-10-CM

## 2017-11-16 DIAGNOSIS — N938 Other specified abnormal uterine and vaginal bleeding: Secondary | ICD-10-CM

## 2017-11-16 DIAGNOSIS — E669 Obesity, unspecified: Secondary | ICD-10-CM | POA: Insufficient documentation

## 2017-11-16 DIAGNOSIS — R030 Elevated blood-pressure reading, without diagnosis of hypertension: Secondary | ICD-10-CM

## 2017-11-16 DIAGNOSIS — I10 Essential (primary) hypertension: Secondary | ICD-10-CM

## 2017-11-16 DIAGNOSIS — Z3202 Encounter for pregnancy test, result negative: Secondary | ICD-10-CM

## 2017-11-16 LAB — POCT URINALYSIS DIP (DEVICE)
Glucose, UA: NEGATIVE mg/dL
Leukocytes, UA: NEGATIVE
NITRITE: NEGATIVE
Protein, ur: 100 mg/dL — AB
Urobilinogen, UA: 1 mg/dL (ref 0.0–1.0)
pH: 6 (ref 5.0–8.0)

## 2017-11-16 LAB — POCT I-STAT, CHEM 8
BUN: 7 mg/dL (ref 6–20)
CREATININE: 0.7 mg/dL (ref 0.44–1.00)
Calcium, Ion: 1.18 mmol/L (ref 1.15–1.40)
Chloride: 107 mmol/L (ref 98–111)
Glucose, Bld: 92 mg/dL (ref 70–99)
HEMATOCRIT: 44 % (ref 36.0–46.0)
Hemoglobin: 15 g/dL (ref 12.0–15.0)
POTASSIUM: 4 mmol/L (ref 3.5–5.1)
SODIUM: 139 mmol/L (ref 135–145)
TCO2: 23 mmol/L (ref 22–32)

## 2017-11-16 LAB — HCG, QUANTITATIVE, PREGNANCY: hCG, Beta Chain, Quant, S: 1 m[IU]/mL (ref <5)

## 2017-11-16 LAB — POCT PREGNANCY, URINE: Preg Test, Ur: NEGATIVE

## 2017-11-16 MED ORDER — CEFTRIAXONE SODIUM 250 MG IJ SOLR
INTRAMUSCULAR | Status: AC
  Filled 2017-11-16: qty 250

## 2017-11-16 MED ORDER — AZITHROMYCIN 250 MG PO TABS
ORAL_TABLET | ORAL | Status: AC
  Filled 2017-11-16: qty 4

## 2017-11-16 MED ORDER — AZITHROMYCIN 250 MG PO TABS
1000.0000 mg | ORAL_TABLET | Freq: Once | ORAL | Status: AC
  Administered 2017-11-16: 1000 mg via ORAL

## 2017-11-16 MED ORDER — CEFTRIAXONE SODIUM 250 MG IJ SOLR
250.0000 mg | Freq: Once | INTRAMUSCULAR | Status: AC
  Administered 2017-11-16: 250 mg via INTRAMUSCULAR

## 2017-11-16 NOTE — ED Provider Notes (Signed)
[facility]   [id number] 11/16/17 Arrival Time: 1930   WG:[id number]:VAGINAL DISCHARGE  SUBJECTIVE:  Judy BowensCierra D Wood is a 22 y.o. female who presents with complaint of vaginal discharge x 2 weeks.  Last unprotected sex 1 week ago.  Patient is sexually active with 1 female partner  Describes discharge as thick and white.  She has NOT tried OTC medications.  She reports worsening symptoms with having intercourse.  She reports similar symptoms in the past and was diagnosed with STD. Complain of mild abdominal discomfort, vaginal itching, vaginal bleeding, and dyspareunia. She denies fever, chills, nausea, vomiting, urinary symptoms, vaginal rashes or lesions.   Patient also reports vaginal bleeding x 2 weeks.  Denies precipitating event.  LMP July of this year.  Reports spotting in August.  Last unprotected sex 1 week ago.  Reports previous symptoms after being diagnosed with STDs.    Patient's last menstrual period was 11/16/2017.    ROS: As per HPI.  Past Medical History:  Diagnosis Date  . Asthma    pt states she took albuterol inhaler lately  . Depression   . H/O umbilical hernia repair   . Hernia, umbilical   . Hypertension    on meds  . Hypertension   . Obesity   . Syphilis 08/30/2014   Treated   Past Surgical History:  Procedure Laterality Date  . CESAREAN SECTION N/A 05/19/2012   Procedure:  Primary CESAREAN SECTION  of baby girl  at 1933  APGAR 4/5/5;  Surgeon: Adam PhenixJames G Arnold, MD;  Location: [facility];  Service: Obstetrics;  Laterality: N/A;  . UMBILICAL HERNIA REPAIR     as a child  . UMBILICAL HERNIA REPAIR     Allergies  Allergen Reactions  . Banana Anaphylaxis  . Lactose Intolerance (Gi) Diarrhea and Nausea Only  . Sulfa Antibiotics Other (See Comments)    Childhood reaction   No current facility-administered medications on file prior to encounter.    Current Outpatient Medications on File Prior to Encounter  Medication Sig Dispense Refill  . hydrochlorothiazide  (HYDRODIURIL) 25 MG tablet Take 1 tablet (25 mg total) by mouth daily. 30 tablet 2  . labetalol (NORMODYNE) 100 MG tablet Take 1 tablet (100 mg total) by mouth 2 (two) times daily. (Patient not taking: Reported on 12/30/2015) 60 tablet 1  . nitrofurantoin, macrocrystal-monohydrate, (MACROBID) 100 MG capsule Take 1 capsule (100 mg total) by mouth 2 (two) times daily. 10 capsule 0  . prenatal vitamin w/FE, FA (PRENATAL 1 + 1) 27-1 MG TABS tablet Take 1 tablet by mouth daily at 12 noon.      Social History   Socioeconomic History  . Marital status: Single    Spouse name: Not on file  . Number of children: Not on file  . Years of education: Not on file  . Highest education level: Not on file  Occupational History  . Not on file  Social Needs  . Financial resource strain: Not on file  . Food insecurity:    Worry: Not on file    Inability: Not on file  . Transportation needs:    Medical: Not on file    Non-medical: Not on file  Tobacco Use  . Smoking status: Current Every Day Smoker    Packs/day: 0.50    Types: Cigarettes  . Smokeless tobacco: Never Used  Substance and Sexual Activity  . Alcohol use: Yes  . Drug use: Yes    Types: Marijuana, Other-see comments    Comment:  Stopped when she found out she was pregnant   . Sexual activity: Yes    Birth control/protection: Pill  Lifestyle  . Physical activity:    Days per week: Not on file    Minutes per session: Not on file  . Stress: Not on file  Relationships  . Social connections:    Talks on phone: Not on file    Gets together: Not on file    Attends religious service: Not on file    Active member of club or organization: Not on file    Attends meetings of clubs or organizations: Not on file    Relationship status: Not on file  . Intimate partner violence:    Fear of current or ex partner: Not on file    Emotionally abused: Not on file    Physically abused: Not on file    Forced sexual activity: Not on file  Other Topics  Concern  . Not on file  Social History Narrative  . Not on file   Family History  Problem Relation Age of Onset  . Hypertension Mother   . Anemia Mother   . Depression Mother   . Deep vein thrombosis Maternal Grandmother   . Diabetes Maternal Grandmother   . Kidney disease Maternal Grandmother   . Cancer Maternal Grandmother        colon cancer  . Diabetes Father     OBJECTIVE:  Vitals:   11/16/17 1957 11/16/17 1958 11/16/17 2106  BP: (!) 164/100  (!) 155/112  Pulse: 98  93  Resp: 18    Temp: 98.3 F (36.8 C)    TempSrc: Oral    SpO2: 100%    Weight:  272 lb (123.4 kg)      General appearance: alert, cooperative, appears stated age and no distress; nontoxic appearance Throat: lips, mucosa, and tongue normal; teeth and gums normal Lungs: CTA bilaterally without adventitious breath sounds Heart: regular rate and rhythm.  Radial pulses 2+ symmetrical bilaterally Back: no CVA tenderness Abdomen: protuberant, soft, non-tender; bowel sounds normal; no guarding  GU:  external examination without vulvar lesions or erythema Bimanual exam: Negative for cervical motion or adenexal tenderness, blood evident; Speculum exam: Blood present during speculum exam; Cervix visualized with active bleeding without erythema or lesions. Cervical swab obtained Skin: warm and dry Psychological:  Alert and cooperative. Abnormal mood and affect; patient reports she is "high on marijuana."  Results for orders placed or performed during the hospital encounter of 11/16/17  POCT urinalysis dip (device)  Result Value Ref Range   Glucose, UA NEGATIVE NEGATIVE mg/dL   Bilirubin Urine SMALL (A) NEGATIVE   Ketones, ur TRACE (A) NEGATIVE mg/dL   Specific Gravity, Urine >=1.030 1.005 - 1.030   Hgb urine dipstick LARGE (A) NEGATIVE   pH 6.0 5.0 - 8.0   Protein, ur 100 (A) NEGATIVE mg/dL   Urobilinogen, UA 1.0 0.0 - 1.0 mg/dL   Nitrite NEGATIVE NEGATIVE   Leukocytes, UA NEGATIVE NEGATIVE  Pregnancy,  urine POC  Result Value Ref Range   Preg Test, Ur NEGATIVE NEGATIVE  I-STAT, chem 8  Result Value Ref Range   Sodium 139 135 - 145 mmol/L   Potassium 4.0 3.5 - 5.1 mmol/L   Chloride 107 98 - 111 mmol/L   BUN 7 6 - 20 mg/dL   Creatinine, Ser 1.61 0.44 - 1.00 mg/dL   Glucose, Bld 92 70 - 99 mg/dL   Calcium, Ion 0.96 0.45 - 1.40 mmol/L   TCO2 23  22 - 32 mmol/L   Hemoglobin 15.0 12.0 - 15.0 g/dL   HCT 16.1 09.6 - 04.5 %    Labs Reviewed  POCT URINALYSIS DIP (DEVICE) - Abnormal; Notable for the following components:      Result Value   Bilirubin Urine SMALL (*)    Ketones, ur TRACE (*)    Hgb urine dipstick LARGE (*)    Protein, ur 100 (*)    All other components within normal limits  HIV ANTIBODY (ROUTINE TESTING W REFLEX)  RPR  HCG, QUANTITATIVE, PREGNANCY  POCT PREGNANCY, URINE  POCT I-STAT, CHEM 8  CERVICOVAGINAL ANCILLARY ONLY    ASSESSMENT & PLAN:  1. STD exposure   2. Vaginal discharge   3. Vaginal bleeding   4. Elevated blood pressure reading     Meds ordered this encounter  Medications  . azithromycin (ZITHROMAX) tablet 1,000 mg  . cefTRIAXone (ROCEPHIN) injection 250 mg    Pending: Labs Reviewed  POCT URINALYSIS DIP (DEVICE) - Abnormal; Notable for the following components:      Result Value   Bilirubin Urine SMALL (*)    Ketones, ur TRACE (*)    Hgb urine dipstick LARGE (*)    Protein, ur 100 (*)    All other components within normal limits  HIV ANTIBODY (ROUTINE TESTING W REFLEX)  RPR  HCG, QUANTITATIVE, PREGNANCY  POCT PREGNANCY, URINE  POCT I-STAT, CHEM 8  CERVICOVAGINAL ANCILLARY ONLY   Blood work did not show anemia Pending beta hCG.    Negative urine pregnancy.  No pain with vaginal or abdominal exam makes pelvic inflammatory disease less likely.   Given rocephin 250mg  injection and azithromycin 1g in office Cervical swab obtained HIV/ syphilis testing today We will follow up with you regarding the results of your test If tests are  positive, please abstain from sexual activity for at least 7 days and notify partners Return here or go to [facility] if you have any new or worsening symptoms such as pelvic pain, abdominal pain, fever, chills, nausea, vomiting, lightheadedness, dizziness, etc...  Reviewed expectations re: course of current medical issues. Questions answered. Outlined signs and symptoms indicating need for more acute intervention. Patient verbalized understanding. After Visit Summary given.       Rennis Harding, PA-C 11/16/17 2113
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
## 2017-11-16 NOTE — Discharge Instructions (Addendum)
Negative urine pregnancy.  No pain with vaginal or abdominal exam makes pelvic inflammatory disease less likely.   Given rocephin 250mg  injection and azithromycin 1g in office Cervical swab obtained HIV/ syphilis testing today We will follow up with you regarding the results of your test If tests are positive, please abstain from sexual activity for at least 7 days and notify partners [facility] if you have any new or worsening symptoms such as pelvic pain, abdominal pain, fever, chills, nausea, vomiting, lightheadedness, dizziness, etc...  Blood pressure elevated in office.  Please recheck in 24 hours.  If it continues to be greater than 140/90 please follow up with PCP for further evaluation and management.

## 2017-11-17 ENCOUNTER — Telehealth (HOSPITAL_COMMUNITY): Payer: Self-pay

## 2017-11-17 LAB — CERVICOVAGINAL ANCILLARY ONLY
BACTERIAL VAGINITIS: POSITIVE — AB
CANDIDA VAGINITIS: NEGATIVE
CHLAMYDIA, DNA PROBE: NEGATIVE
Neisseria Gonorrhea: POSITIVE — AB
Trichomonas: NEGATIVE

## 2017-11-17 LAB — HIV ANTIBODY (ROUTINE TESTING W REFLEX): HIV SCREEN 4TH GENERATION: NONREACTIVE

## 2017-11-17 MED ORDER — METRONIDAZOLE 500 MG PO TABS: 500.0000 mg | ORAL_TABLET | Freq: Two times a day (BID) | ORAL | 0 refills | Status: DC

## 2017-11-17 NOTE — Telephone Encounter (Signed)
Bacterial vaginosis is positive. This was not treated at the urgent care visit.  Flagyl 500 mg BID x 7 days #14 no refills sent to patients pharmacy of choice.    Test for gonorrhea was positive. This was treated at the urgent care visit with IM rocephin 250mg  and po zithromax 1g. Need to educate patient to refrain from sexual intercourse for 7 days after treatment to give the medicine time to work. Sexual partners need to be notified and tested/treated. Condoms may reduce risk of reinfection. Recheck or followup with PCP for further evaluation if symptoms are not improving.  [facility] notified.   Attempted to reach patient. No answer at this time. Message sent to mychart.

## 2017-11-18 LAB — RPR: RPR Ser Ql: REACTIVE — AB

## 2017-11-18 LAB — RPR, QUANT+TP ABS (REFLEX): T Pallidum Abs: POSITIVE — AB

## 2017-11-20 ENCOUNTER — Telehealth (HOSPITAL_COMMUNITY): Payer: Self-pay

## 2017-11-20 NOTE — Telephone Encounter (Signed)
Pt is positive for Syphillis. This was not treated. Attempted to reach patient, no answer at this time. [place]

## 2017-11-21 ENCOUNTER — Ambulatory Visit (HOSPITAL_COMMUNITY)
Admission: EM | Admit: 2017-11-21 | Discharge: 2017-11-21 | Disposition: A | Discharge status: Home / Self Care | Payer: Self-pay | Attending: Family Medicine | Admitting: Family Medicine

## 2017-11-21 DIAGNOSIS — A539 Syphilis, unspecified: Secondary | ICD-10-CM

## 2017-11-21 MED ORDER — PENICILLIN G BENZATHINE 1200000 UNIT/2ML IM SUSP
INTRAMUSCULAR | Status: AC
  Filled 2017-11-21: qty 4

## 2017-11-21 MED ORDER — PENICILLIN G BENZATHINE 1200000 UNIT/2ML IM SUSP
2.4000 10*6.[IU] | Freq: Once | INTRAMUSCULAR | Status: AC
  Administered 2017-11-21: 2.4 10*6.[IU] via INTRAMUSCULAR

## 2017-11-21 NOTE — ED Triage Notes (Signed)
Pt here for treatment for Syphilis.

## 2018-01-11 ENCOUNTER — Ambulatory Visit: Payer: Self-pay

## 2018-03-22 ENCOUNTER — Ambulatory Visit (HOSPITAL_COMMUNITY)
Admission: EM | Admit: 2018-03-22 | Discharge: 2018-03-22 | Disposition: A | Discharge status: Home / Self Care | Payer: Self-pay | Attending: Internal Medicine | Admitting: Internal Medicine

## 2018-03-22 ENCOUNTER — Encounter (HOSPITAL_COMMUNITY): Payer: Self-pay | Admitting: Emergency Medicine

## 2018-03-22 DIAGNOSIS — N898 Other specified noninflammatory disorders of vagina: Secondary | ICD-10-CM | POA: Insufficient documentation

## 2018-03-22 MED ORDER — LIDOCAINE HCL (PF) 1 % IJ SOLN
INTRAMUSCULAR | Status: AC
  Filled 2018-03-22: qty 2

## 2018-03-22 MED ORDER — CEFTRIAXONE SODIUM 250 MG IJ SOLR
INTRAMUSCULAR | Status: AC
  Filled 2018-03-22: qty 250

## 2018-03-22 MED ORDER — AZITHROMYCIN 250 MG PO TABS
ORAL_TABLET | ORAL | Status: AC
  Filled 2018-03-22: qty 4

## 2018-03-22 MED ORDER — AZITHROMYCIN 250 MG PO TABS
1000.0000 mg | ORAL_TABLET | Freq: Once | ORAL | Status: AC
  Administered 2018-03-22: 1000 mg via ORAL

## 2018-03-22 MED ORDER — CEFTRIAXONE SODIUM 250 MG IJ SOLR
250.0000 mg | Freq: Once | INTRAMUSCULAR | Status: AC
  Administered 2018-03-22: 250 mg via INTRAMUSCULAR

## 2018-03-22 NOTE — ED Triage Notes (Signed)
Pt states "I think I have a yeast infection but I want to be tested and treated just to be sure". Vaginal discharge, states she doesn't know how long its been going on.

## 2018-03-23 LAB — CERVICOVAGINAL ANCILLARY ONLY
Bacterial vaginitis: POSITIVE — AB
Chlamydia: NEGATIVE
Neisseria Gonorrhea: POSITIVE — AB
Trichomonas: NEGATIVE

## 2018-03-26 ENCOUNTER — Telehealth (HOSPITAL_COMMUNITY): Payer: Self-pay | Admitting: Emergency Medicine

## 2018-03-26 MED ORDER — METRONIDAZOLE 500 MG PO TABS: 500.0000 mg | ORAL_TABLET | Freq: Two times a day (BID) | ORAL | 0 refills | Status: DC

## 2018-03-26 NOTE — Telephone Encounter (Signed)
Test for gonorrhea was positive. This was treated at the urgent care visit with IM rocephin 250mg  and po zithromax 1g. Pt needs education to refrain from sexual intercourse for 7 days after treatment to give the medicine time to work.  Sexual partners need to be notified and tested/treated.  Condoms may reduce risk of reinfection.  Recheck or followup with PCP for further evaluation if symptoms are not improving. [facility] notified.   Bacterial vaginosis is positive. This was not treated at the urgent care visit.  Flagyl 500 mg BID x 7 days #14 no refills sent to patients pharmacy of choice.    Call pt no answer and no voice mail set up

## 2018-03-28 ENCOUNTER — Telehealth (HOSPITAL_COMMUNITY): Payer: Self-pay | Admitting: Emergency Medicine

## 2018-03-28 NOTE — Telephone Encounter (Signed)
Attempted to reach patient. No answer at this time. Voicemail left.    

## 2018-03-28 NOTE — Telephone Encounter (Signed)
Patient contacted and made aware of all results, all questions answered.   

## 2018-05-14 ENCOUNTER — Inpatient Hospital Stay (HOSPITAL_COMMUNITY)
Admission: AD | Admit: 2018-05-14 | Discharge: 2018-05-14 | Disposition: A | Discharge status: Home / Self Care | Payer: Self-pay | Attending: Obstetrics & Gynecology | Admitting: Obstetrics & Gynecology

## 2018-05-14 DIAGNOSIS — Z3202 Encounter for pregnancy test, result negative: Secondary | ICD-10-CM

## 2018-05-14 DIAGNOSIS — N912 Amenorrhea, unspecified: Secondary | ICD-10-CM | POA: Insufficient documentation

## 2018-05-14 LAB — POCT PREGNANCY, URINE: PREG TEST UR: NEGATIVE

## 2018-05-14 NOTE — Discharge Instructions (Signed)
Home Pregnancy Test Information    A home pregnancy test helps you determine whether you are pregnant or not. There are several types of home pregnancy tests that can be bought at a grocery store or pharmacy.  What is being tested?  A home pregnancy test detects the presence of a hormone in your urine. The hormone is produced by cells of the placenta (human chorionic gonadotropin, or hCG). The placenta is the organ that forms to nourish and support a developing baby.  How are pregnancy tests done?  Home pregnancy tests require a urine sample.   Most kits use a plastic testing device with a strip of paper that indicates whether there is hCG in your urine.   Follow the test package instructions very carefully for how to test your urine. Depending on the test, you may need to:  ? Urinate directly onto the stick.  ? Urinate into a cup.   Wait for the results as directed by the package instructions. The amount of time may be different for each type of test.   Follow the test package instructions for how to read your test results. Depending on the test, results may be displayed as:  ? A plus or a minus sign.  ? One or two lines.  ? "Pregnant" or "not pregnant."   For best results, use your first urine of the morning. That is when the concentration of hCG is highest.  How accurate are home pregnancy tests?  Home pregnancy tests are very accurate when:   You are at least 3-[redacted] weeks pregnant.   It has been 1-2 weeks since your missed period.   You use the test according to the package instructions.  What can interfere with home pregnancy test results?  Sometimes, a home pregnancy test may report that you are pregnant when you are not pregnant (false-positive result). This can happen if you:   Are taking certain medicines, such as:  ? Medicine to control seizures.  ? Anti-anxiety medicine.  ? Fertility medicine with hCG.   Have a medical condition that affects your hormone levels.   Had a recent pregnancy loss  (miscarriage) or abortion.  Sometimes, a home pregnancy test may report that you are not pregnant when you are pregnant (false-negative result). This can happen if you:   Took the test too early in your pregnancy. Before 3-4 weeks of pregnancy, there may not be enough hCG to detect.   Drank a lot of liquid before the test.   Used an expired pregnancy test.   Are taking certain medicines, such as antihistamines or water pills (diuretics).  What should I do if I have a positive pregnancy test?  If you have a positive home pregnancy test, schedule an appointment with your health care provider. You might need additional testing to confirm the pregnancy.  What should I do if I have a negative pregnancy test?  If you have a negative home pregnancy test but still have symptoms of pregnancy, contact your health care provider. Your health care provider will test a sample of your blood to check for pregnancy. In some cases, a blood test will return a positive result even if a urine test was negative because blood tests are more sensitive. This means blood tests can detect hCG earlier than home pregnancy tests.  Follow these instructions at home:  If you are pregnant, planning to become pregnant, or think you may be pregnant:   Do not drink alcohol.   Do not   use street drugs.   Do not use any products that contain nicotine or tobacco, such as cigarettes and e-cigarettes. If you need help quitting, ask your health care provider.   Take a prenatal vitamin that contains at least 400 mcg of folic acid daily.  Summary   A home pregnancy test helps you determine whether you are pregnant or not by detecting the presence of the hormone human chorionic gonadotropin (hCG) in a sample of your urine.   Follow the test package instructions very carefully. For best results, use your first urine of the morning. That is when the concentration of hCG is highest.   Home pregnancy tests are very accurate when you are 3-[redacted] weeks  pregnant or when it has been 1-2 weeks since your missed period.   A home pregnancy test may report that you are pregnant when you are not pregnant or that you are not pregnant when you are pregnant.   Contact your health care provider to confirm your results. Your health care provider will test a sample of your blood to check for pregnancy.  This information is not intended to replace advice given to you by your health care provider. Make sure you discuss any questions you have with your health care provider.  Document Released: 02/17/2003 Document Revised: 02/27/2017 Document Reviewed: 02/27/2017  Elsevier Interactive Patient Education  2019 [employer]

## 2018-05-14 NOTE — MAU Provider Note (Signed)
   S Ms. HIEN LADEAU is a 23 y.o. [phone] non-pregnant female who presents to MAU today with complaint of missed period. She endorses LMP of 04/02/2018 and she reports that she has always gotten her period on the third of the month. She denies abdominal pain, vaginal bleeding or physical complaints of any kind   O BP (!) 145/94   Pulse 80   Temp 98.1 F (36.7 C)   Resp 16   LMP 04/02/2018   SpO2 100%  Physical Exam  Nursing note and vitals reviewed. Constitutional: She is oriented to person, place, and time. She appears well-developed and well-nourished.  Cardiovascular: Normal rate.  Respiratory: Effort normal.  GI: Soft. She exhibits no distension. There is no abdominal tenderness. There is no rebound and no guarding.  Musculoskeletal: Normal range of motion.  Neurological: She is alert and oriented to person, place, and time.  Skin: Skin is warm and dry.  Psychiatric: She has a normal mood and affect. Her behavior is normal. Judgment and thought content normal.    A Non pregnant female Medical screening exam complete   P Discharge from MAU in stable condition Patient given the option of transfer to [facility] for further evaluation or seek care in outpatient facility of choice List of options for follow-up given  Warning signs for worsening condition that would warrant emergency follow-up discussed Patient may return to MAU as needed for pregnancy related complaints  Calvert Cantor, CNM 05/14/2018 6:20 PM

## 2018-05-14 NOTE — MAU Note (Signed)
Pt presents to MAU with possible pregnancy, LMP 04/02/18 Denies any pain

## 2018-10-29 ENCOUNTER — Ambulatory Visit (HOSPITAL_COMMUNITY)
Admission: EM | Admit: 2018-10-29 | Discharge: 2018-10-29 | Disposition: A | Discharge status: Home / Self Care | Payer: Self-pay

## 2018-10-29 ENCOUNTER — Other Ambulatory Visit: Payer: Self-pay

## 2018-12-05 NOTE — ED Provider Notes (Signed)
[place]    CSN: [id number] Arrival date & time: 03/22/18  [place]      History   Chief Complaint Chief Complaint  Patient presents with  . Vaginal Discharge    HPI Judy Wood is a 23 y.o. female.   Pt c/o vaginal discharge. She is quite glib re: details of timing or associated symptoms.      Past Medical History:  Diagnosis Date  . Asthma    pt states she took albuterol inhaler lately  . Depression   . H/O umbilical hernia repair   . Hernia, umbilical   . Hypertension    on meds  . Hypertension   . Obesity   . Syphilis 08/30/2014   Treated    Patient Active Problem List   Diagnosis Date Noted  . Depression 12/30/2015  . Well woman exam with routine gynecological exam 12/30/2015  . Breast screening 12/30/2015  . History of syphilis 11/24/2014  . Obesity 11/24/2014  . Hypertension 10/17/2011    Past Surgical History:  Procedure Laterality Date  . CESAREAN SECTION N/A 05/19/2012   Procedure:  Primary CESAREAN SECTION  of baby girl  at [place] 4/5/5;  Surgeon: Woodroe Mode, MD;  Location: [facility];  Service: Obstetrics;  Laterality: N/A;  . UMBILICAL HERNIA REPAIR     as a child  . UMBILICAL HERNIA REPAIR      OB History    Gravida  2   Para  2   Term  2   Preterm      AB      Living  2     SAB      TAB      Ectopic      Multiple  0   Live Births  2            Home Medications    Prior to Admission medications   Medication Sig Start Date End Date Taking? Authorizing Provider  hydrochlorothiazide (HYDRODIURIL) 25 MG tablet Take 1 tablet (25 mg total) by mouth daily. Patient not taking: Reported on 03/22/2018 12/30/15   Rasch, Anderson Malta I, NP  labetalol (NORMODYNE) 100 MG tablet Take 1 tablet (100 mg total) by mouth 2 (two) times daily. Patient not taking: Reported on 12/30/2015 10/06/14   Truett Mainland, DO  metroNIDAZOLE (FLAGYL) 500 MG tablet Take 1 tablet (500 mg total) by mouth 2 (two) times daily. Patient not  taking: Reported on 03/22/2018 11/17/17   Raylene Everts, MD  metroNIDAZOLE (FLAGYL) 500 MG tablet Take 1 tablet (500 mg total) by mouth 2 (two) times daily. 03/26/18   Raylene Everts, MD  nitrofurantoin, macrocrystal-monohydrate, (MACROBID) 100 MG capsule Take 1 capsule (100 mg total) by mouth 2 (two) times daily. Patient not taking: Reported on 03/22/2018 12/15/16   Zigmund Gottron, NP  prenatal vitamin w/FE, FA (PRENATAL 1 + 1) 27-1 MG TABS tablet Take 1 tablet by mouth daily at 12 noon.    [provider]    Family History Family History  Problem Relation Age of Onset  . Hypertension Mother   . Anemia Mother   . Depression Mother   . Deep vein thrombosis Maternal Grandmother   . Diabetes Maternal Grandmother   . Kidney disease Maternal Grandmother   . Cancer Maternal Grandmother        colon cancer  . Diabetes Father     Social History Social History   Tobacco Use  . Smoking status: Current  Every Day Smoker    Packs/day: 0.50    Types: Cigarettes  . Smokeless tobacco: Never Used  Substance Use Topics  . Alcohol use: Yes  . Drug use: Yes    Types: Marijuana, Other-see comments    Comment: Stopped when she found out she was pregnant      Allergies   Banana, Lactose intolerance (gi), and Sulfa antibiotics   Review of Systems Review of Systems  Constitutional: Negative for chills and fever.  HENT: Negative for sore throat and tinnitus.   Eyes: Negative for redness.  Respiratory: Negative for cough and shortness of breath.   Cardiovascular: Negative for chest pain and palpitations.  Gastrointestinal: Negative for abdominal pain, diarrhea, nausea and vomiting.  Genitourinary: Positive for vaginal discharge. Negative for dysuria, frequency, urgency and vaginal pain.  Musculoskeletal: Negative for myalgias.  Skin: Negative for rash.       No lesions  Neurological: Negative for weakness.  Hematological: Does not bruise/bleed easily.    Psychiatric/Behavioral: Negative for suicidal ideas.     Physical Exam Triage Vital Signs ED Triage Vitals  Enc Vitals Group     BP 03/22/18 1932 (!) 168/100     Pulse Rate 03/22/18 1931 100     Resp 03/22/18 1931 18     Temp 03/22/18 1931 99.9 F (37.7 C)     Temp src --      SpO2 03/22/18 1931 99 %     Weight --      Height --      Head Circumference --      Peak Flow --      Pain Score 03/22/18 1932 0     Pain Loc --      Pain Edu? --      Excl. in GC? --    No data found.  Updated Vital Signs BP (!) 168/100   Pulse 100   Temp 99.9 F (37.7 C)   Resp 18   LMP 03/02/2018   SpO2 99%   Visual Acuity Right Eye Distance:   Left Eye Distance:   Bilateral Distance:    Right Eye Near:   Left Eye Near:    Bilateral Near:     Physical Exam Vitals signs and nursing note reviewed.  Constitutional:      General: She is not in acute distress.    Appearance: She is well-developed.  HENT:     Head: Normocephalic and atraumatic.  Eyes:     General: No scleral icterus.    Conjunctiva/sclera: Conjunctivae normal.     Pupils: Pupils are equal, round, and reactive to light.  Neck:     Musculoskeletal: Normal range of motion and neck supple.     Thyroid: No thyromegaly.     Vascular: No JVD.     Trachea: No tracheal deviation.  Cardiovascular:     Rate and Rhythm: Normal rate and regular rhythm.     Heart sounds: Normal heart sounds. No murmur. No friction rub. No gallop.   Pulmonary:     Effort: Pulmonary effort is normal.     Breath sounds: Normal breath sounds.  Abdominal:     General: Bowel sounds are normal. There is no distension.     Palpations: Abdomen is soft.     Tenderness: There is no abdominal tenderness.  Musculoskeletal: Normal range of motion.  Lymphadenopathy:     Cervical: No cervical adenopathy.  Skin:    General: Skin is warm and dry.  Neurological:  Mental Status: She is alert and oriented to person, place, and time.     Cranial  Nerves: No cranial nerve deficit.  Psychiatric:        Behavior: Behavior normal.        Thought Content: Thought content normal.        Judgment: Judgment normal.      UC Treatments / Results  Labs (all labs ordered are listed, but only abnormal results are displayed) Labs Reviewed  CERVICOVAGINAL ANCILLARY ONLY - Abnormal; Notable for the following components:      Result Value   Bacterial vaginitis **POSITIVE for Gardnerella vaginalis** (*)    Neisseria Gonorrhea **POSITIVE** (*)    All other components within normal limits    EKG   Radiology No results found.  Procedures Procedures (including critical care time)  Medications Ordered in UC Medications  cefTRIAXone (ROCEPHIN) injection 250 mg (250 mg Intramuscular Given 03/22/18 2001)  azithromycin (ZITHROMAX) tablet 1,000 mg (1,000 mg Oral Given 03/22/18 2001)  azithromycin (ZITHROMAX) 250 MG tablet (has no administration in time range)  cefTRIAXone (ROCEPHIN) 250 MG injection (has no administration in time range)  lidocaine (PF) (XYLOCAINE) 1 % injection (has no administration in time range)    Initial Impression / Assessment and Plan / UC Course  I have reviewed the triage vital signs and the nursing notes.  Pertinent labs & imaging results that were available during my care of the patient were reviewed by me and considered in my medical decision making (see chart for details).     Will obtain urine for GC/Chlam, yeast, BV. Will call patient with results.  Final Clinical Impressions(s) / UC Diagnoses   Final diagnoses:  Vaginal discharge   Discharge Instructions   None    ED Prescriptions    None     PDMP not reviewed this encounter.   Arnaldo Natal, MD 12/05/18 2146

## 2019-08-26 ENCOUNTER — Ambulatory Visit (HOSPITAL_COMMUNITY)
Admission: EM | Admit: 2019-08-26 | Discharge: 2019-08-26 | Disposition: A | Discharge status: Home / Self Care | Payer: Self-pay | Attending: Internal Medicine | Admitting: Internal Medicine

## 2019-08-26 ENCOUNTER — Other Ambulatory Visit: Payer: Self-pay

## 2019-08-26 ENCOUNTER — Encounter (HOSPITAL_COMMUNITY): Payer: Self-pay | Admitting: Emergency Medicine

## 2019-08-26 DIAGNOSIS — N939 Abnormal uterine and vaginal bleeding, unspecified: Secondary | ICD-10-CM

## 2019-08-26 DIAGNOSIS — R6889 Other general symptoms and signs: Secondary | ICD-10-CM

## 2019-08-26 DIAGNOSIS — I10 Essential (primary) hypertension: Secondary | ICD-10-CM

## 2019-08-26 DIAGNOSIS — Z3202 Encounter for pregnancy test, result negative: Secondary | ICD-10-CM

## 2019-08-26 LAB — POCT URINALYSIS DIP (DEVICE)
Glucose, UA: 100 mg/dL — AB
Ketones, ur: 15 mg/dL — AB
Leukocytes,Ua: NEGATIVE
Nitrite: NEGATIVE
Protein, ur: >=300 mg/dL — AB
Specific Gravity, Urine: >=1.03 (ref 1.005–1.030)
Urobilinogen, UA: 1 mg/dL (ref 0.0–1.0)
pH: 5 (ref 5.0–8.0)

## 2019-08-26 LAB — COMPREHENSIVE METABOLIC PANEL
ALT: 24 U/L (ref 0–44)
AST: 23 U/L (ref 15–41)
Albumin: 4.1 g/dL (ref 3.5–5.0)
Alkaline Phosphatase: 67 U/L (ref 38–126)
Anion gap: 9 (ref 5–15)
BUN: 8 mg/dL (ref 6–20)
CO2: 24 mmol/L (ref 22–32)
Calcium: 9.4 mg/dL (ref 8.9–10.3)
Chloride: 106 mmol/L (ref 98–111)
Creatinine, Ser: 1.03 mg/dL — ABNORMAL HIGH (ref 0.44–1.00)
GFR calc Af Amer: >60 mL/min (ref >60)
GFR calc non Af Amer: >60 mL/min (ref >60)
Glucose, Bld: 101 mg/dL — ABNORMAL HIGH (ref 70–99)
Potassium: 3.9 mmol/L (ref 3.5–5.1)
Sodium: 139 mmol/L (ref 135–145)
Total Bilirubin: 0.5 mg/dL (ref 0.3–1.2)
Total Protein: 7.6 g/dL (ref 6.5–8.1)

## 2019-08-26 LAB — TSH: TSH: 2.131 u[IU]/mL (ref 0.350–4.500)

## 2019-08-26 LAB — CBC WITH DIFFERENTIAL/PLATELET
Abs Immature Granulocytes: 0.03 10*3/uL (ref 0.00–0.07)
Basophils Absolute: 0.1 10*3/uL (ref 0.0–0.1)
Basophils Relative: 1 %
Eosinophils Absolute: 0.2 10*3/uL (ref 0.0–0.5)
Eosinophils Relative: 2 %
HCT: 42.2 % (ref 36.0–46.0)
Hemoglobin: 13.6 g/dL (ref 12.0–15.0)
Immature Granulocytes: 0 %
Lymphocytes Relative: 37 %
Lymphs Abs: 2.9 10*3/uL (ref 0.7–4.0)
MCH: 28.9 pg (ref 26.0–34.0)
MCHC: 32.2 g/dL (ref 30.0–36.0)
MCV: 89.8 fL (ref 80.0–100.0)
Monocytes Absolute: 0.6 10*3/uL (ref 0.1–1.0)
Monocytes Relative: 8 %
Neutro Abs: 4.1 10*3/uL (ref 1.7–7.7)
Neutrophils Relative %: 52 %
Platelets: 348 10*3/uL (ref 150–400)
RBC: 4.7 MIL/uL (ref 3.87–5.11)
RDW: 12.1 % (ref 11.5–15.5)
WBC: 7.9 10*3/uL (ref 4.0–10.5)
nRBC: 0 % (ref 0.0–0.2)

## 2019-08-26 LAB — HIV ANTIBODY (ROUTINE TESTING W REFLEX): HIV Screen 4th Generation wRfx: NONREACTIVE

## 2019-08-26 LAB — POC URINE PREG, ED: Preg Test, Ur: NEGATIVE

## 2019-08-26 MED ORDER — AMLODIPINE BESYLATE 10 MG PO TABS: 10.0000 mg | ORAL_TABLET | Freq: Every day | ORAL | 0 refills | Status: DC

## 2019-08-26 MED ORDER — MEDROXYPROGESTERONE ACETATE 10 MG PO TABS: 10.0000 mg | ORAL_TABLET | Freq: Every day | ORAL | 0 refills | Status: DC

## 2019-08-26 NOTE — ED Provider Notes (Signed)
[facility]    CSN: [id number] Arrival date & time: 08/26/19  1241      History   Chief Complaint Chief Complaint  Patient presents with  . Vaginal Bleeding    HPI Judy Wood is a 24 y.o. female comes to the urgent care with complaints of vaginal bleeding over the past 3-1/2 weeks.  Patient missed her period in May 2021.   She eventually started having menstrual period in the early part of June and has been persistent.  She has been going through 3-4 pads every day.  She has some lower abdominal cramping.  No dysuria urgency or frequency.  No back or flank pain.  No nausea or vomiting.  Patient was sexually active with a sexual partner who was not faithful to her.  Patient complains of heat intolerance, weight loss and decreased sleep over the past month or 2.  Patient is not trying to lose weight.  No visual problems.  She denies any chest pain or palpitations.  Patient has a history of hypertension previously managed on hydrochlorothiazide, lisinopril and metoprolol.  She is quit taking them was for several months.  She denies any headaches during this visit.  No chest pain or severe abdominal pain. HPI  Past Medical History:  Diagnosis Date  . Asthma    pt states she took albuterol inhaler lately  . Depression   . H/O umbilical hernia repair   . Hernia, umbilical   . Hypertension    on meds  . Hypertension   . Obesity   . Syphilis 08/30/2014   Treated    Patient Active Problem List   Diagnosis Date Noted  . Depression 12/30/2015  . Well woman exam with routine gynecological exam 12/30/2015  . Breast screening 12/30/2015  . History of syphilis 11/24/2014  . Obesity 11/24/2014  . Hypertension 10/17/2011    Past Surgical History:  Procedure Laterality Date  . CESAREAN SECTION N/A 05/19/2012   Procedure:  Primary CESAREAN SECTION  of baby girl  at 1933  [facility] 4/5/5;  Surgeon: Adam Phenix, MD;  Location: [facility];  Service: Obstetrics;  Laterality: N/A;    . UMBILICAL HERNIA REPAIR     as a child  . UMBILICAL HERNIA REPAIR      OB History    Gravida  2   Para  2   Term  2   Preterm      AB      Living  2     SAB      TAB      Ectopic      Multiple  0   Live Births  2            Home Medications    Prior to Admission medications   Medication Sig Start Date End Date Taking? Authorizing Provider  amLODipine (NORVASC) 10 MG tablet Take 1 tablet (10 mg total) by mouth daily. 08/26/19   Akanksha Bellmore, Britta Mccreedy, MD  medroxyPROGESTERone (PROVERA) 10 MG tablet Take 1 tablet (10 mg total) by mouth daily for 10 days. 08/26/19 09/05/19  Merrilee Jansky, MD    Family History Family History  Problem Relation Age of Onset  . Hypertension Mother   . Anemia Mother   . Depression Mother   . Deep vein thrombosis Maternal Grandmother   . Diabetes Maternal Grandmother   . Kidney disease Maternal Grandmother   . Cancer Maternal Grandmother        colon cancer  .  Diabetes Father     Social History Social History   Tobacco Use  . Smoking status: Current Every Day Smoker    Packs/day: 0.50    Types: Cigarettes  . Smokeless tobacco: Never Used  Substance Use Topics  . Alcohol use: Yes  . Drug use: Yes    Types: Marijuana, Other-see comments    Comment: Stopped when she found out she was pregnant      Allergies   Banana, Lactose intolerance (gi), and Sulfa antibiotics   Review of Systems Review of Systems  Constitutional: Positive for activity change and fatigue. Negative for chills and fever.  HENT: Negative.   Gastrointestinal: Negative.   Endocrine: Positive for heat intolerance. Negative for polydipsia, polyphagia and polyuria.  Genitourinary: Positive for vaginal bleeding. Negative for dysuria, genital sores, urgency, vaginal discharge and vaginal pain.  Skin: Negative for wound.  Neurological: Positive for dizziness. Negative for weakness, light-headedness and headaches.     Physical Exam Triage Vital  Signs ED Triage Vitals  Enc Vitals Group     BP 08/26/19 1412 (!) 179/126     Pulse Rate 08/26/19 1412 79     Resp 08/26/19 1412 (!) 22     Temp 08/26/19 1412 98.4 F (36.9 C)     Temp Source 08/26/19 1412 Oral     SpO2 08/26/19 1412 94 %     Weight --      Height --      Head Circumference --      Peak Flow --      Pain Score 08/26/19 1408 5     Pain Loc --      Pain Edu? --      Excl. in GC? --    No data found.  Updated Vital Signs BP (!) 178/117 (BP Location: Right Arm) Comment (BP Location): regular cuff, right forearm  Pulse 79   Temp 98.4 F (36.9 C) (Oral)   Resp (!) 22   SpO2 94%   Visual Acuity Right Eye Distance:   Left Eye Distance:   Bilateral Distance:    Right Eye Near:   Left Eye Near:    Bilateral Near:     Physical Exam Vitals and nursing note reviewed.  Constitutional:      General: She is not in acute distress.    Appearance: She is not ill-appearing.  Cardiovascular:     Rate and Rhythm: Normal rate and regular rhythm.     Pulses: Normal pulses.     Heart sounds: Normal heart sounds.  Pulmonary:     Effort: Pulmonary effort is normal.     Breath sounds: Normal breath sounds.  Abdominal:     General: Bowel sounds are normal.     Tenderness: There is no abdominal tenderness. There is no left CVA tenderness, guarding or rebound.     Hernia: No hernia is present.  Musculoskeletal:        General: Normal range of motion.  Skin:    General: Skin is warm.     Capillary Refill: Capillary refill takes less than 2 seconds.  Neurological:     Mental Status: She is alert.      UC Treatments / Results  Labs (all labs ordered are listed, but only abnormal results are displayed) Labs Reviewed  POCT URINALYSIS DIP (DEVICE) - Abnormal; Notable for the following components:      Result Value   Glucose, UA 100 (*)    Bilirubin Urine MODERATE (*)  Ketones, ur 15 (*)    Hgb urine dipstick LARGE (*)    Protein, ur >=300 (*)    All other  components within normal limits  CBC WITH DIFFERENTIAL/PLATELET  COMPREHENSIVE METABOLIC PANEL  TSH  HIV ANTIBODY (ROUTINE TESTING W REFLEX)  RPR  POC URINE PREG, ED  CERVICOVAGINAL ANCILLARY ONLY    EKG   Radiology No results found.  Procedures Procedures (including critical care time)  Medications Ordered in UC Medications - No data to display  Initial Impression / Assessment and Plan / UC Course  I have reviewed the triage vital signs and the nursing notes.  Pertinent labs & imaging results that were available during my care of the patient were reviewed by me and considered in my medical decision making (see chart for details).     1.  Dysfunctional uterine bleeding: Urine pregnancy test is negative CBC, BMP Provera 10 mg daily for 10 days Patient is advised to establish care with gynecology if the bleeding persists Urinalysis is significant for hematuria  2.  Heat intolerance or recent weight loss: TSH  3.  Hypertension, uncontrolled: Patient will be started on amlodipine She does not take any antihypertensive agents at this time Medication compliance is emphasized.  Return precautions was given to the patient. Final Clinical Impressions(s) / UC Diagnoses   Final diagnoses:  Vaginal bleeding  Heat intolerance  Hypertension, uncontrolled   Discharge Instructions   None    ED Prescriptions    Medication Sig Dispense Auth. Provider   amLODipine (NORVASC) 10 MG tablet Take 1 tablet (10 mg total) by mouth daily. 30 tablet Layney Gillson, Myrene Galas, MD   medroxyPROGESTERone (PROVERA) 10 MG tablet Take 1 tablet (10 mg total) by mouth daily for 10 days. 10 tablet Marko Skalski, Myrene Galas, MD     PDMP not reviewed this encounter.   Chase Picket, MD 08/26/19 [phone]

## 2019-08-26 NOTE — ED Triage Notes (Addendum)
Patient has irregular periods.  Partner has cheated on her.  No pain with urination.  Patient has blood in urine .  Patient has been bleeding for 2 weeks.  Uncomfortable feeling in lower abdomen and lower back.  Patient is describing large clots, patient says she has not seen anything like this before

## 2019-08-27 LAB — CERVICOVAGINAL ANCILLARY ONLY
Chlamydia: POSITIVE — AB
Comment: NEGATIVE
Comment: NEGATIVE
Comment: NORMAL
Neisseria Gonorrhea: NEGATIVE
Trichomonas: NEGATIVE

## 2019-08-27 LAB — RPR
RPR Ser Ql: REACTIVE — AB
RPR Titer: 1:4 {titer}

## 2019-08-28 ENCOUNTER — Telehealth (HOSPITAL_COMMUNITY): Payer: Self-pay | Admitting: Orthopedic Surgery

## 2019-08-28 LAB — T.PALLIDUM AB, TOTAL: T Pallidum Abs: REACTIVE — AB

## 2019-08-28 MED ORDER — AZITHROMYCIN 250 MG PO TABS: 1000.0000 mg | ORAL_TABLET | Freq: Once | ORAL | 0 refills | Status: AC

## 2019-08-29 ENCOUNTER — Telehealth (HOSPITAL_COMMUNITY): Payer: Self-pay

## 2019-08-29 MED ORDER — AZITHROMYCIN 250 MG PO TABS: 1000.0000 mg | ORAL_TABLET | Freq: Once | ORAL | 0 refills | Status: AC

## 2019-08-29 NOTE — Telephone Encounter (Signed)
Pt called and stated Rx for zithromax not at [facility] (pt in place at pharmacy at time of call). Medications ordered thru epic, but no receipt confirmation noted. Rx resent. Pt to pick up 08/30/19

## 2019-08-31 ENCOUNTER — Telehealth (HOSPITAL_COMMUNITY): Payer: Self-pay | Admitting: *Deleted

## 2019-08-31 MED ORDER — AZITHROMYCIN 250 MG PO TABS: 1000.0000 mg | ORAL_TABLET | Freq: Once | ORAL | 0 refills | Status: AC

## 2022-05-02 ENCOUNTER — Ambulatory Visit (HOSPITAL_COMMUNITY)
Admission: EM | Admit: 2022-05-02 | Discharge: 2022-05-02 | Disposition: A | Discharge status: Home / Self Care | Payer: Medicaid Other | Attending: Emergency Medicine | Admitting: Emergency Medicine

## 2022-05-02 ENCOUNTER — Encounter (HOSPITAL_COMMUNITY): Payer: Self-pay

## 2022-05-02 DIAGNOSIS — Z3202 Encounter for pregnancy test, result negative: Secondary | ICD-10-CM

## 2022-05-02 DIAGNOSIS — I160 Hypertensive urgency: Secondary | ICD-10-CM | POA: Insufficient documentation

## 2022-05-02 LAB — POC URINE PREG, ED: Preg Test, Ur: NEGATIVE

## 2022-05-02 MED ORDER — AMLODIPINE BESYLATE 10 MG PO TABS: 10.0000 mg | ORAL_TABLET | Freq: Every day | ORAL | 3 refills | Status: DC

## 2022-05-02 NOTE — Discharge Instructions (Addendum)
Please go to the emergency department for evaluation.

## 2022-05-02 NOTE — ED Provider Notes (Signed)
[place]    CSN: [id number] Arrival date & time: 05/02/22  [id number]      History   Chief Complaint Chief Complaint  Patient presents with   Exposure to STD   Hypertension    HPI Judy Wood is a 27 y.o. female.  Originally here for STD testing Multiple partners, possible exposures Hx irregular menses. LMP unknown  Hx HTN. She has been on multiple BP meds in the past but discontinues them. Last seen 2021 and prescribed amlodipine. She has not been taking this.  BP today is 218/150 On initial triage she denies red flag symptoms However to this provider she endorses frequent headaches, dizziness, and several episodes of near-syncope over the last few days. She reports getting dizzy, "falling asleep", then waking up and not remembering anything Additionally reports spontaneous gum bleeding.  She does endorse frequent cocaine and alcohol use as coping mechanisms History of depression  Past Medical History:  Diagnosis Date   Asthma    pt states she took albuterol inhaler lately   Depression    H/O umbilical hernia repair    Hernia, umbilical    Hypertension    on meds   Hypertension    Obesity    Syphilis 08/30/2014   Treated    Patient Active Problem List   Diagnosis Date Noted   Depression 12/30/2015   Well woman exam with routine gynecological exam 12/30/2015   Breast screening 12/30/2015   History of syphilis 11/24/2014   Obesity 11/24/2014   Hypertension 10/17/2011    Past Surgical History:  Procedure Laterality Date   CESAREAN SECTION N/A 05/19/2012   Procedure:  Primary CESAREAN SECTION  of baby girl  at [place] 4/5/5;  Surgeon: Woodroe Mode, MD;  Location: [facility];  Service: Obstetrics;  Laterality: N/A;   UMBILICAL HERNIA REPAIR     as a child   UMBILICAL HERNIA REPAIR      OB History     Gravida  2   Para  2   Term  2   Preterm      AB      Living  2      SAB      IAB      Ectopic      Multiple  0   Live  Births  2            Home Medications    Prior to Admission medications   Medication Sig Start Date End Date Taking? Authorizing Provider  amLODipine (NORVASC) 10 MG tablet Take 1 tablet (10 mg total) by mouth daily. 05/02/22  Yes Reba Hulett, Wells Guiles, PA-C  amLODipine (NORVASC) 10 MG tablet Take 1 tablet (10 mg total) by mouth daily. 08/26/19   Lamptey, Myrene Galas, MD  medroxyPROGESTERone (PROVERA) 10 MG tablet Take 1 tablet (10 mg total) by mouth daily for 10 days. 08/26/19 09/05/19  LampteyMyrene Galas, MD    Family History Family History  Problem Relation Age of Onset   Hypertension Mother    Anemia Mother    Depression Mother    Deep vein thrombosis Maternal Grandmother    Diabetes Maternal Grandmother    Kidney disease Maternal Grandmother    Cancer Maternal Grandmother        colon cancer   Diabetes Father     Social History Social History   Tobacco Use   Smoking status: Every Day    Packs/day: 0.50    Types: Cigarettes   Smokeless  tobacco: Never  Substance Use Topics   Alcohol use: Yes   Drug use: Yes    Types: Marijuana, Other-see comments    Comment: Stopped when she found out she was pregnant      Allergies   Banana, Lactose intolerance (gi), and Sulfa antibiotics   Review of Systems Review of Systems Per HPI  Physical Exam Triage Vital Signs ED Triage Vitals [05/02/22 1019]  Enc Vitals Group     BP      Pulse      Resp      Temp      Temp src      SpO2      Weight      Height      Head Circumference      Peak Flow      Pain Score 0     Pain Loc      Pain Edu?      Excl. in [place]?    No data found.  Updated Vital Signs BP (!) 218/150 (BP Location: Left Arm)   Pulse (!) 118   Temp 98.2 F (36.8 C) (Oral)   Resp 12   LMP  (LMP Unknown)   SpO2 98%    Physical Exam Vitals and nursing note reviewed.  Constitutional:      Appearance: She is not ill-appearing.  Eyes:     Extraocular Movements: Extraocular movements intact.      Conjunctiva/sclera: Conjunctivae normal.     Pupils: Pupils are equal, round, and reactive to light.  Cardiovascular:     Rate and Rhythm: Normal rate and regular rhythm.  Pulmonary:     Effort: Pulmonary effort is normal.     Breath sounds: Normal breath sounds.  Musculoskeletal:     Cervical back: Normal range of motion.  Skin:    General: Skin is warm and dry.  Neurological:     Mental Status: She is alert and oriented to person, place, and time.  Psychiatric:        Mood and Affect: Mood is depressed. Affect is tearful.      UC Treatments / Results  Labs (all labs ordered are listed, but only abnormal results are displayed) Labs Reviewed  POC URINE PREG, ED  CERVICOVAGINAL ANCILLARY ONLY    EKG  Radiology No results found.  Procedures Procedures (including critical care time)  Medications Ordered in UC Medications - No data to display  Initial Impression / Assessment and Plan / UC Course  I have reviewed the triage vital signs and the nursing notes.  Pertinent labs & imaging results that were available during my care of the patient were reviewed by me and considered in my medical decision making (see chart for details).  UPT negative  Cytology swab pending  BP is significantly elevated in clinic.  With symptoms described by patient, I recommend she be evaluated in the emergency department for hypertensive urgency. Especially with what sounds like frequent syncopal episodes.  Patient tearful and expressing feelings of loneliness, fatigue. She does not have anyone to talk to. Discussed once she is seen in the ED, I recommend to follow with the [facility]. Ambulatory referral to social work placed  She is discharged to the ED with mom as chaperone   I had originally sent amlodipine 10 mg to pharmacy. Unable to cancel this order due to system problems. Will defer outpatient treatment to EDP at this time  Final Clinical Impressions(s) / UC Diagnoses   Final  diagnoses:  Hypertensive urgency     Discharge Instructions      Please go to the emergency department for evaluation.      ED Prescriptions     Medication Sig Dispense Auth. Provider   amLODipine (NORVASC) 10 MG tablet Take 1 tablet (10 mg total) by mouth daily. 30 tablet Kane Kusek, Wells Guiles, PA-C      PDMP not reviewed this encounter.   Davionna Blacksher, Wells Guiles, [place] 05/02/22 1124

## 2022-05-02 NOTE — ED Triage Notes (Signed)
Pt is here for STD  exposure and , thrush in mouth possibly due to oral sex x few days , pt needs refill on b/p meds

## 2022-05-02 NOTE — ED Notes (Signed)
Patient is being discharged from the Urgent Care and sent to the Emergency Department via car . Per [facility] PA, patient is in need of higher level of care due to hYPERTENSIVE CRISIS. Patient is aware and verbalizes understanding of plan of care.  Vitals:   05/02/22 1023  BP: (!) 218/150  Pulse: (!) 118  Resp: 12  Temp: 98.2 F (36.8 C)  SpO2: 98%

## 2022-05-02 NOTE — ED Triage Notes (Signed)
uc

## 2022-05-03 ENCOUNTER — Telehealth (HOSPITAL_COMMUNITY): Payer: Self-pay | Admitting: Emergency Medicine

## 2022-05-03 LAB — CERVICOVAGINAL ANCILLARY ONLY
Bacterial Vaginitis (gardnerella): POSITIVE — AB
Candida Glabrata: NEGATIVE
Candida Vaginitis: NEGATIVE
Chlamydia: NEGATIVE
Comment: NEGATIVE
Comment: NEGATIVE
Comment: NEGATIVE
Comment: NEGATIVE
Comment: NEGATIVE
Comment: NORMAL
Neisseria Gonorrhea: NEGATIVE
Trichomonas: NEGATIVE

## 2022-05-03 MED ORDER — METRONIDAZOLE 500 MG PO TABS: 500.0000 mg | ORAL_TABLET | Freq: Two times a day (BID) | ORAL | 0 refills | Status: DC

## 2022-09-28 ENCOUNTER — Emergency Department (HOSPITAL_COMMUNITY): Payer: Medicaid Other

## 2022-09-28 ENCOUNTER — Other Ambulatory Visit: Payer: Self-pay

## 2022-09-28 ENCOUNTER — Emergency Department (HOSPITAL_COMMUNITY): Payer: Medicaid Other | Admitting: Anesthesiology

## 2022-09-28 ENCOUNTER — Inpatient Hospital Stay (HOSPITAL_COMMUNITY)
Admission: EM | Admit: 2022-09-28 | Discharge: 2022-10-06 | DRG: 493 | Disposition: A | Discharge status: Rehabilitation Facility | Payer: Medicaid Other | Attending: Student | Admitting: Student

## 2022-09-28 ENCOUNTER — Inpatient Hospital Stay (HOSPITAL_COMMUNITY): Payer: Medicaid Other

## 2022-09-28 ENCOUNTER — Encounter (HOSPITAL_COMMUNITY): Payer: Self-pay | Admitting: Emergency Medicine

## 2022-09-28 ENCOUNTER — Encounter (HOSPITAL_COMMUNITY)
Admission: EM | Disposition: A | Discharge status: Rehabilitation Facility | Payer: Self-pay | Source: Home / Self Care | Attending: Student

## 2022-09-28 DIAGNOSIS — Z800 Family history of malignant neoplasm of digestive organs: Secondary | ICD-10-CM | POA: Diagnosis not present

## 2022-09-28 DIAGNOSIS — Z560 Unemployment, unspecified: Secondary | ICD-10-CM | POA: Diagnosis not present

## 2022-09-28 DIAGNOSIS — D62 Acute posthemorrhagic anemia: Secondary | ICD-10-CM | POA: Diagnosis present

## 2022-09-28 DIAGNOSIS — Z79899 Other long term (current) drug therapy: Secondary | ICD-10-CM | POA: Diagnosis not present

## 2022-09-28 DIAGNOSIS — F319 Bipolar disorder, unspecified: Secondary | ICD-10-CM | POA: Diagnosis present

## 2022-09-28 DIAGNOSIS — S92112A Displaced fracture of neck of left talus, initial encounter for closed fracture: Secondary | ICD-10-CM | POA: Diagnosis not present

## 2022-09-28 DIAGNOSIS — F4310 Post-traumatic stress disorder, unspecified: Secondary | ICD-10-CM | POA: Diagnosis present

## 2022-09-28 DIAGNOSIS — Z7982 Long term (current) use of aspirin: Secondary | ICD-10-CM

## 2022-09-28 DIAGNOSIS — Y92410 Unspecified street and highway as the place of occurrence of the external cause: Secondary | ICD-10-CM

## 2022-09-28 DIAGNOSIS — S93315A Dislocation of tarsal joint of left foot, initial encounter: Secondary | ICD-10-CM | POA: Insufficient documentation

## 2022-09-28 DIAGNOSIS — Z8249 Family history of ischemic heart disease and other diseases of the circulatory system: Secondary | ICD-10-CM

## 2022-09-28 DIAGNOSIS — S82892S Other fracture of left lower leg, sequela: Secondary | ICD-10-CM | POA: Diagnosis not present

## 2022-09-28 DIAGNOSIS — S91002A Unspecified open wound, left ankle, initial encounter: Secondary | ICD-10-CM | POA: Insufficient documentation

## 2022-09-28 DIAGNOSIS — S92122A Displaced fracture of body of left talus, initial encounter for closed fracture: Secondary | ICD-10-CM | POA: Diagnosis present

## 2022-09-28 DIAGNOSIS — S9305XA Dislocation of left ankle joint, initial encounter: Secondary | ICD-10-CM | POA: Diagnosis not present

## 2022-09-28 DIAGNOSIS — I10 Essential (primary) hypertension: Secondary | ICD-10-CM | POA: Diagnosis present

## 2022-09-28 DIAGNOSIS — K5900 Constipation, unspecified: Secondary | ICD-10-CM | POA: Diagnosis present

## 2022-09-28 DIAGNOSIS — Z23 Encounter for immunization: Secondary | ICD-10-CM | POA: Diagnosis not present

## 2022-09-28 DIAGNOSIS — Z833 Family history of diabetes mellitus: Secondary | ICD-10-CM

## 2022-09-28 DIAGNOSIS — S92102A Unspecified fracture of left talus, initial encounter for closed fracture: Secondary | ICD-10-CM | POA: Diagnosis not present

## 2022-09-28 DIAGNOSIS — J45909 Unspecified asthma, uncomplicated: Secondary | ICD-10-CM | POA: Diagnosis present

## 2022-09-28 DIAGNOSIS — E739 Lactose intolerance, unspecified: Secondary | ICD-10-CM | POA: Diagnosis present

## 2022-09-28 DIAGNOSIS — S82831A Other fracture of upper and lower end of right fibula, initial encounter for closed fracture: Secondary | ICD-10-CM | POA: Diagnosis present

## 2022-09-28 DIAGNOSIS — R5381 Other malaise: Secondary | ICD-10-CM | POA: Diagnosis not present

## 2022-09-28 DIAGNOSIS — Z6841 Body Mass Index (BMI) 40.0 and over, adult: Secondary | ICD-10-CM | POA: Diagnosis not present

## 2022-09-28 DIAGNOSIS — E876 Hypokalemia: Secondary | ICD-10-CM | POA: Diagnosis not present

## 2022-09-28 DIAGNOSIS — S82892B Other fracture of left lower leg, initial encounter for open fracture type I or II: Secondary | ICD-10-CM | POA: Diagnosis present

## 2022-09-28 DIAGNOSIS — S92112B Displaced fracture of neck of left talus, initial encounter for open fracture: Secondary | ICD-10-CM | POA: Diagnosis not present

## 2022-09-28 DIAGNOSIS — E669 Obesity, unspecified: Secondary | ICD-10-CM | POA: Diagnosis not present

## 2022-09-28 DIAGNOSIS — F17210 Nicotine dependence, cigarettes, uncomplicated: Secondary | ICD-10-CM | POA: Diagnosis present

## 2022-09-28 DIAGNOSIS — Z818 Family history of other mental and behavioral disorders: Secondary | ICD-10-CM

## 2022-09-28 DIAGNOSIS — S82892A Other fracture of left lower leg, initial encounter for closed fracture: Secondary | ICD-10-CM | POA: Diagnosis not present

## 2022-09-28 DIAGNOSIS — S82892D Other fracture of left lower leg, subsequent encounter for closed fracture with routine healing: Secondary | ICD-10-CM | POA: Diagnosis not present

## 2022-09-28 DIAGNOSIS — S82892E Other fracture of left lower leg, subsequent encounter for open fracture type I or II with routine healing: Secondary | ICD-10-CM | POA: Diagnosis not present

## 2022-09-28 DIAGNOSIS — Z882 Allergy status to sulfonamides status: Secondary | ICD-10-CM

## 2022-09-28 DIAGNOSIS — Z91018 Allergy to other foods: Secondary | ICD-10-CM

## 2022-09-28 DIAGNOSIS — S92122B Displaced fracture of body of left talus, initial encounter for open fracture: Secondary | ICD-10-CM

## 2022-09-28 HISTORY — PX: OPEN REDUCTION INTERNAL FIXATION (ORIF) FOOT LISFRANC FRACTURE: SHX5990

## 2022-09-28 HISTORY — PX: I & D EXTREMITY: SHX5045

## 2022-09-28 LAB — COMPREHENSIVE METABOLIC PANEL
ALT: 19 U/L (ref 0–44)
AST: 31 U/L (ref 15–41)
Albumin: 3.6 g/dL (ref 3.5–5.0)
Alkaline Phosphatase: 64 U/L (ref 38–126)
Anion gap: 12 (ref 5–15)
BUN: 10 mg/dL (ref 6–20)
CO2: 21 mmol/L — ABNORMAL LOW (ref 22–32)
Calcium: 8.7 mg/dL — ABNORMAL LOW (ref 8.9–10.3)
Chloride: 102 mmol/L (ref 98–111)
Creatinine, Ser: 0.92 mg/dL (ref 0.44–1.00)
GFR, Estimated: >60 mL/min (ref >60)
Glucose, Bld: 165 mg/dL — ABNORMAL HIGH (ref 70–99)
Potassium: 3.4 mmol/L — ABNORMAL LOW (ref 3.5–5.1)
Sodium: 135 mmol/L (ref 135–145)
Total Bilirubin: 0.6 mg/dL (ref 0.3–1.2)
Total Protein: 7.4 g/dL (ref 6.5–8.1)

## 2022-09-28 LAB — ETHANOL: Alcohol, Ethyl (B): <10 mg/dL (ref <10)

## 2022-09-28 LAB — CBC
HCT: 40.2 % (ref 36.0–46.0)
Hemoglobin: 12.5 g/dL (ref 12.0–15.0)
MCH: 27.6 pg (ref 26.0–34.0)
MCHC: 31.1 g/dL (ref 30.0–36.0)
MCV: 88.7 fL (ref 80.0–100.0)
Platelets: 397 10*3/uL (ref 150–400)
RBC: 4.53 MIL/uL (ref 3.87–5.11)
RDW: 13 % (ref 11.5–15.5)
WBC: 13.3 10*3/uL — ABNORMAL HIGH (ref 4.0–10.5)
nRBC: 0 % (ref 0.0–0.2)

## 2022-09-28 LAB — I-STAT CHEM 8, ED
BUN: 11 mg/dL (ref 6–20)
Calcium, Ion: 1.07 mmol/L — ABNORMAL LOW (ref 1.15–1.40)
Chloride: 106 mmol/L (ref 98–111)
Creatinine, Ser: 0.8 mg/dL (ref 0.44–1.00)
Glucose, Bld: 168 mg/dL — ABNORMAL HIGH (ref 70–99)
HCT: 40 % (ref 36.0–46.0)
Hemoglobin: 13.6 g/dL (ref 12.0–15.0)
Potassium: 3.5 mmol/L (ref 3.5–5.1)
Sodium: 138 mmol/L (ref 135–145)
TCO2: 21 mmol/L — ABNORMAL LOW (ref 22–32)

## 2022-09-28 LAB — VITAMIN D 25 HYDROXY (VIT D DEFICIENCY, FRACTURES): Vit D, 25-Hydroxy: 21.54 ng/mL — ABNORMAL LOW (ref 30–100)

## 2022-09-28 LAB — PROTIME-INR
INR: 1 (ref 0.8–1.2)
Prothrombin Time: 13.5 seconds (ref 11.4–15.2)

## 2022-09-28 LAB — SAMPLE TO BLOOD BANK

## 2022-09-28 LAB — I-STAT CG4 LACTIC ACID, ED: Lactic Acid, Venous: 1.9 mmol/L (ref 0.5–1.9)

## 2022-09-28 LAB — HCG, SERUM, QUALITATIVE: Preg, Serum: NEGATIVE

## 2022-09-28 SURGERY — IRRIGATION AND DEBRIDEMENT EXTREMITY
Anesthesia: General | Site: Ankle | Laterality: Left

## 2022-09-28 MED ORDER — FENTANYL CITRATE (PF) 250 MCG/5ML IJ SOLN
INTRAMUSCULAR | Status: AC
  Filled 2022-09-28: qty 5

## 2022-09-28 MED ORDER — ONDANSETRON HCL 4 MG/2ML IJ SOLN: 4.0000 mg | Freq: Four times a day (QID) | INTRAMUSCULAR | Status: DC | PRN

## 2022-09-28 MED ORDER — ENOXAPARIN SODIUM 40 MG/0.4ML IJ SOSY
40.0000 mg | PREFILLED_SYRINGE | INTRAMUSCULAR | Status: DC
  Administered 2022-09-29 – 2022-10-06 (×6): 40 mg via SUBCUTANEOUS
  Filled 2022-09-28 (×7): qty 0.4

## 2022-09-28 MED ORDER — IOHEXOL 350 MG/ML SOLN
75.0000 mL | Freq: Once | INTRAVENOUS | Status: AC | PRN
  Administered 2022-09-28: 75 mL via INTRAVENOUS

## 2022-09-28 MED ORDER — HYDRALAZINE HCL 20 MG/ML IJ SOLN
10.0000 mg | Freq: Once | INTRAMUSCULAR | Status: AC
  Administered 2022-09-28: 10 mg via INTRAVENOUS

## 2022-09-28 MED ORDER — ORAL CARE MOUTH RINSE: 15.0000 mL | Freq: Once | OROMUCOSAL | Status: DC

## 2022-09-28 MED ORDER — ONDANSETRON HCL 4 MG/2ML IJ SOLN
INTRAMUSCULAR | Status: AC
  Filled 2022-09-28: qty 2

## 2022-09-28 MED ORDER — MIDAZOLAM HCL 2 MG/2ML IJ SOLN
INTRAMUSCULAR | Status: DC | PRN
  Administered 2022-09-28: 2 mg via INTRAVENOUS

## 2022-09-28 MED ORDER — ONDANSETRON HCL 4 MG/2ML IJ SOLN
4.0000 mg | Freq: Once | INTRAMUSCULAR | Status: AC
  Administered 2022-09-28: 4 mg via INTRAVENOUS

## 2022-09-28 MED ORDER — KETAMINE HCL 50 MG/5ML IJ SOSY
PREFILLED_SYRINGE | INTRAMUSCULAR | Status: AC
  Filled 2022-09-28: qty 5

## 2022-09-28 MED ORDER — ACETAMINOPHEN 10 MG/ML IV SOLN
INTRAVENOUS | Status: DC | PRN
  Administered 2022-09-28: 1000 mg via INTRAVENOUS

## 2022-09-28 MED ORDER — PROPOFOL 10 MG/ML IV BOLUS
INTRAVENOUS | Status: DC | PRN
  Administered 2022-09-28: 200 mg via INTRAVENOUS

## 2022-09-28 MED ORDER — ONDANSETRON HCL 4 MG/2ML IJ SOLN
INTRAMUSCULAR | Status: DC | PRN
  Administered 2022-09-28: 4 mg via INTRAVENOUS

## 2022-09-28 MED ORDER — ONDANSETRON HCL 4 MG PO TABS
4.0000 mg | ORAL_TABLET | Freq: Four times a day (QID) | ORAL | Status: DC | PRN
  Administered 2022-10-02 – 2022-10-06 (×3): 4 mg via ORAL
  Filled 2022-09-28 (×3): qty 1

## 2022-09-28 MED ORDER — METHOCARBAMOL 1000 MG/10ML IJ SOLN: 500.0000 mg | Freq: Four times a day (QID) | INTRAVENOUS | Status: DC | PRN

## 2022-09-28 MED ORDER — HYDRALAZINE HCL 10 MG PO TABS: 10.0000 mg | ORAL_TABLET | Freq: Four times a day (QID) | ORAL | Status: DC | PRN

## 2022-09-28 MED ORDER — LABETALOL HCL 5 MG/ML IV SOLN
10.0000 mg | Freq: Once | INTRAVENOUS | Status: AC
  Administered 2022-09-28: 10 mg via INTRAVENOUS

## 2022-09-28 MED ORDER — HYDROMORPHONE HCL 1 MG/ML IJ SOLN
1.0000 mg | Freq: Once | INTRAMUSCULAR | Status: AC
  Administered 2022-09-28: 1 mg via INTRAVENOUS

## 2022-09-28 MED ORDER — OXYCODONE HCL 5 MG PO TABS: 5.0000 mg | ORAL_TABLET | Freq: Once | ORAL | Status: DC | PRN

## 2022-09-28 MED ORDER — LACTATED RINGERS IV SOLN: INTRAVENOUS | Status: DC

## 2022-09-28 MED ORDER — SUGAMMADEX SODIUM 200 MG/2ML IV SOLN
INTRAVENOUS | Status: DC | PRN
  Administered 2022-09-28: 250 mg via INTRAVENOUS

## 2022-09-28 MED ORDER — ROCURONIUM BROMIDE 10 MG/ML (PF) SYRINGE
PREFILLED_SYRINGE | INTRAVENOUS | Status: DC | PRN
  Administered 2022-09-28: 60 mg via INTRAVENOUS

## 2022-09-28 MED ORDER — DEXMEDETOMIDINE HCL IN NACL 80 MCG/20ML IV SOLN
INTRAVENOUS | Status: DC | PRN
  Administered 2022-09-28: 12 ug via INTRAVENOUS
  Administered 2022-09-28: 8 ug via INTRAVENOUS

## 2022-09-28 MED ORDER — FENTANYL CITRATE (PF) 100 MCG/2ML IJ SOLN
25.0000 ug | INTRAMUSCULAR | Status: DC | PRN
  Administered 2022-09-28: 25 ug via INTRAVENOUS

## 2022-09-28 MED ORDER — LABETALOL HCL 5 MG/ML IV SOLN
INTRAVENOUS | Status: AC
  Filled 2022-09-28: qty 4

## 2022-09-28 MED ORDER — SODIUM CHLORIDE 0.9 % IV SOLN: INTRAVENOUS | Status: DC

## 2022-09-28 MED ORDER — MIDAZOLAM HCL 2 MG/2ML IJ SOLN
INTRAMUSCULAR | Status: AC
  Filled 2022-09-28: qty 2

## 2022-09-28 MED ORDER — DEXAMETHASONE SODIUM PHOSPHATE 10 MG/ML IJ SOLN
INTRAMUSCULAR | Status: DC | PRN
  Administered 2022-09-28: 5 mg via INTRAVENOUS

## 2022-09-28 MED ORDER — OXYCODONE HCL 5 MG PO TABS
5.0000 mg | ORAL_TABLET | ORAL | Status: DC | PRN
  Administered 2022-09-29 – 2022-10-01 (×6): 10 mg via ORAL
  Administered 2022-10-02 (×2): 5 mg via ORAL
  Administered 2022-10-04 – 2022-10-05 (×2): 10 mg via ORAL
  Administered 2022-10-05: 5 mg via ORAL
  Administered 2022-10-06: 10 mg via ORAL
  Administered 2022-10-06: 5 mg via ORAL
  Administered 2022-10-06: 10 mg via ORAL
  Filled 2022-09-28 (×3): qty 2
  Filled 2022-09-28: qty 1
  Filled 2022-09-28 (×4): qty 2
  Filled 2022-09-28 (×2): qty 1
  Filled 2022-09-28 (×4): qty 2
  Filled 2022-09-28 (×2): qty 1

## 2022-09-28 MED ORDER — PROPOFOL 10 MG/ML IV BOLUS
INTRAVENOUS | Status: AC
  Filled 2022-09-28: qty 20

## 2022-09-28 MED ORDER — 0.9 % SODIUM CHLORIDE (POUR BTL) OPTIME
TOPICAL | Status: DC | PRN
  Administered 2022-09-28: 1000 mL

## 2022-09-28 MED ORDER — SODIUM CHLORIDE 0.9 % IR SOLN
Status: DC | PRN
  Administered 2022-09-28: 3000 mL

## 2022-09-28 MED ORDER — DOCUSATE SODIUM 100 MG PO CAPS
100.0000 mg | ORAL_CAPSULE | Freq: Two times a day (BID) | ORAL | Status: DC
  Administered 2022-09-28 – 2022-10-06 (×14): 100 mg via ORAL
  Filled 2022-09-28 (×15): qty 1

## 2022-09-28 MED ORDER — HYDROMORPHONE HCL 1 MG/ML IJ SOLN
INTRAMUSCULAR | Status: AC
  Administered 2022-09-28: 1 mg via INTRAVENOUS
  Filled 2022-09-28: qty 1

## 2022-09-28 MED ORDER — KETOROLAC TROMETHAMINE 15 MG/ML IJ SOLN
15.0000 mg | Freq: Four times a day (QID) | INTRAMUSCULAR | Status: AC
  Administered 2022-09-28 – 2022-09-29 (×4): 15 mg via INTRAVENOUS
  Filled 2022-09-28 (×4): qty 1

## 2022-09-28 MED ORDER — LIDOCAINE 2% (20 MG/ML) 5 ML SYRINGE
INTRAMUSCULAR | Status: AC
  Filled 2022-09-28: qty 5

## 2022-09-28 MED ORDER — FENTANYL CITRATE (PF) 250 MCG/5ML IJ SOLN
INTRAMUSCULAR | Status: DC | PRN
  Administered 2022-09-28 (×3): 50 ug via INTRAVENOUS
  Administered 2022-09-28: 100 ug via INTRAVENOUS

## 2022-09-28 MED ORDER — VANCOMYCIN HCL 1000 MG IV SOLR
INTRAVENOUS | Status: DC | PRN
  Administered 2022-09-28: 1000 mg

## 2022-09-28 MED ORDER — METHOCARBAMOL 500 MG PO TABS
500.0000 mg | ORAL_TABLET | Freq: Four times a day (QID) | ORAL | Status: DC | PRN
  Administered 2022-09-28 – 2022-10-06 (×9): 500 mg via ORAL
  Filled 2022-09-28 (×10): qty 1

## 2022-09-28 MED ORDER — OXYCODONE HCL 5 MG/5ML PO SOLN: 5.0000 mg | Freq: Once | ORAL | Status: DC | PRN

## 2022-09-28 MED ORDER — LABETALOL HCL 5 MG/ML IV SOLN
10.0000 mg | Freq: Once | INTRAVENOUS | Status: AC
  Administered 2022-09-28: 10 mg via INTRAVENOUS
  Filled 2022-09-28: qty 4

## 2022-09-28 MED ORDER — DIPHENHYDRAMINE HCL 12.5 MG/5ML PO ELIX: 12.5000 mg | ORAL_SOLUTION | ORAL | Status: DC | PRN

## 2022-09-28 MED ORDER — SODIUM CHLORIDE 0.9 % IV SOLN
2.0000 g | INTRAVENOUS | Status: DC
  Administered 2022-09-28 – 2022-09-29 (×2): 2 g via INTRAVENOUS
  Filled 2022-09-28 (×2): qty 20

## 2022-09-28 MED ORDER — METOCLOPRAMIDE HCL 5 MG PO TABS: 5.0000 mg | ORAL_TABLET | Freq: Three times a day (TID) | ORAL | Status: DC | PRN

## 2022-09-28 MED ORDER — LIDOCAINE 2% (20 MG/ML) 5 ML SYRINGE
INTRAMUSCULAR | Status: DC | PRN
  Administered 2022-09-28: 40 mg via INTRAVENOUS
  Administered 2022-09-28: 60 mg via INTRAVENOUS

## 2022-09-28 MED ORDER — ACETAMINOPHEN 500 MG PO TABS
1000.0000 mg | ORAL_TABLET | Freq: Four times a day (QID) | ORAL | Status: DC
  Administered 2022-09-28 – 2022-10-06 (×24): 1000 mg via ORAL
  Filled 2022-09-28 (×29): qty 2

## 2022-09-28 MED ORDER — HYDRALAZINE HCL 20 MG/ML IJ SOLN
INTRAMUSCULAR | Status: AC
  Filled 2022-09-28: qty 1

## 2022-09-28 MED ORDER — ONDANSETRON HCL 4 MG/2ML IJ SOLN
INTRAMUSCULAR | Status: AC
  Administered 2022-09-28: 4 mg via INTRAVENOUS
  Filled 2022-09-28: qty 2

## 2022-09-28 MED ORDER — DEXAMETHASONE SODIUM PHOSPHATE 10 MG/ML IJ SOLN
INTRAMUSCULAR | Status: AC
  Filled 2022-09-28: qty 1

## 2022-09-28 MED ORDER — ROCURONIUM BROMIDE 10 MG/ML (PF) SYRINGE
PREFILLED_SYRINGE | INTRAVENOUS | Status: AC
  Filled 2022-09-28: qty 10

## 2022-09-28 MED ORDER — TETANUS-DIPHTH-ACELL PERTUSSIS 5-2.5-18.5 LF-MCG/0.5 IM SUSY
0.5000 mL | PREFILLED_SYRINGE | Freq: Once | INTRAMUSCULAR | Status: AC
  Administered 2022-09-28: 0.5 mL via INTRAMUSCULAR
  Filled 2022-09-28: qty 0.5

## 2022-09-28 MED ORDER — HYDROMORPHONE HCL 1 MG/ML IJ SOLN
0.5000 mg | INTRAMUSCULAR | Status: DC | PRN
  Administered 2022-09-28 – 2022-10-03 (×3): 1 mg via INTRAVENOUS
  Filled 2022-09-28 (×3): qty 1

## 2022-09-28 MED ORDER — OXYCODONE HCL 5 MG PO TABS
10.0000 mg | ORAL_TABLET | ORAL | Status: DC | PRN
  Administered 2022-09-28 – 2022-10-03 (×5): 15 mg via ORAL
  Administered 2022-10-03 – 2022-10-04 (×2): 10 mg via ORAL
  Administered 2022-10-04 – 2022-10-05 (×3): 15 mg via ORAL
  Administered 2022-10-05: 10 mg via ORAL
  Administered 2022-10-05: 15 mg via ORAL
  Filled 2022-09-28: qty 3
  Filled 2022-09-28: qty 2
  Filled 2022-09-28 (×3): qty 3
  Filled 2022-09-28 (×2): qty 2
  Filled 2022-09-28 (×6): qty 3

## 2022-09-28 MED ORDER — FENTANYL CITRATE (PF) 100 MCG/2ML IJ SOLN
INTRAMUSCULAR | Status: AC
  Filled 2022-09-28: qty 2

## 2022-09-28 MED ORDER — POLYETHYLENE GLYCOL 3350 17 G PO PACK: 17.0000 g | PACK | Freq: Every day | ORAL | Status: DC | PRN

## 2022-09-28 MED ORDER — CHLORHEXIDINE GLUCONATE 0.12 % MT SOLN: 15.0000 mL | Freq: Once | OROMUCOSAL | Status: DC

## 2022-09-28 MED ORDER — METOCLOPRAMIDE HCL 5 MG/ML IJ SOLN: 5.0000 mg | Freq: Three times a day (TID) | INTRAMUSCULAR | Status: DC | PRN

## 2022-09-28 MED ORDER — CHLORHEXIDINE GLUCONATE 0.12 % MT SOLN
OROMUCOSAL | Status: AC
  Filled 2022-09-28: qty 15

## 2022-09-28 MED ORDER — VANCOMYCIN HCL 1000 MG IV SOLR
INTRAVENOUS | Status: AC
  Filled 2022-09-28: qty 20

## 2022-09-28 MED ORDER — HYDROMORPHONE HCL 1 MG/ML IJ SOLN
1.0000 mg | Freq: Once | INTRAMUSCULAR | Status: AC
  Administered 2022-09-28: 1 mg via INTRAVENOUS
  Filled 2022-09-28: qty 1

## 2022-09-28 MED ORDER — AMLODIPINE BESYLATE 10 MG PO TABS
10.0000 mg | ORAL_TABLET | Freq: Every day | ORAL | Status: DC
  Administered 2022-09-28 – 2022-10-06 (×8): 10 mg via ORAL
  Filled 2022-09-28 (×9): qty 1

## 2022-09-28 SURGICAL SUPPLY — 71 items
APL PRP STRL LF DISP 70% ISPRP (MISCELLANEOUS)
BAG COUNTER SPONGE SURGICOUNT (BAG) ×2 IMPLANT
BAG SPNG CNTER NS LX DISP (BAG) ×2
BAR EXFX 150X11 NS LF (EXFIX) ×4
BAR EXFX 300X11 NS LF (EXFIX) ×4
BAR GLASS FIBER EXFX 11X150 (EXFIX) IMPLANT
BAR GLASS FIBER EXFX 11X300 (EXFIX) IMPLANT
BNDG CMPR 5X2 KNTD ELC UNQ LF (GAUZE/BANDAGES/DRESSINGS) ×2
BNDG CMPR 5X4 KNIT ELC UNQ LF (GAUZE/BANDAGES/DRESSINGS) ×2
BNDG CMPR 6 X 5 YARDS HK CLSR (GAUZE/BANDAGES/DRESSINGS) ×2
BNDG COHESIVE 4X5 TAN STRL (GAUZE/BANDAGES/DRESSINGS) ×2 IMPLANT
BNDG ELASTIC 2INX 5YD STR LF (GAUZE/BANDAGES/DRESSINGS) IMPLANT
BNDG ELASTIC 4INX 5YD STR LF (GAUZE/BANDAGES/DRESSINGS) IMPLANT
BNDG ELASTIC 6INX 5YD STR LF (GAUZE/BANDAGES/DRESSINGS) IMPLANT
BNDG GAUZE DERMACEA FLUFF 4 (GAUZE/BANDAGES/DRESSINGS) ×4 IMPLANT
BNDG GZE DERMACEA 4 6PLY (GAUZE/BANDAGES/DRESSINGS) ×2
BRUSH SCRUB EZ PLAIN DRY (MISCELLANEOUS) ×4 IMPLANT
CANISTER WOUNDNEG PRESSURE 500 (CANNISTER) IMPLANT
CHLORAPREP W/TINT 26 (MISCELLANEOUS) ×2 IMPLANT
CLAMP BLUE BAR TO BAR (EXFIX) IMPLANT
CLAMP BLUE BAR TO PIN (EXFIX) IMPLANT
COVER MAYO STAND STRL (DRAPES) ×2 IMPLANT
COVER SURGICAL LIGHT HANDLE (MISCELLANEOUS) ×4 IMPLANT
DRAPE DERMATAC (DRAPES) IMPLANT
DRAPE ORTHO SPLIT 77X108 STRL (DRAPES) ×4
DRAPE SURG 17X23 STRL (DRAPES) ×2 IMPLANT
DRAPE SURG ORHT 6 SPLT 77X108 (DRAPES) ×2 IMPLANT
DRAPE U-SHAPE 47X51 STRL (DRAPES) ×2 IMPLANT
DRESSING PEEL AND PLC PRVNA 13 (GAUZE/BANDAGES/DRESSINGS) IMPLANT
DRSG ADAPTIC 3X8 NADH LF (GAUZE/BANDAGES/DRESSINGS) ×2 IMPLANT
DRSG PEEL AND PLACE PREVENA 13 (GAUZE/BANDAGES/DRESSINGS) ×2
ELECT REM PT RETURN 9FT ADLT (ELECTROSURGICAL)
ELECTRODE REM PT RTRN 9FT ADLT (ELECTROSURGICAL) IMPLANT
EVACUATOR 1/8 PVC DRAIN (DRAIN) IMPLANT
GAUZE SPONGE 4X4 12PLY STRL (GAUZE/BANDAGES/DRESSINGS) ×2 IMPLANT
GLOVE BIO SURGEON STRL SZ 6.5 (GLOVE) ×6 IMPLANT
GLOVE BIO SURGEON STRL SZ7.5 (GLOVE) ×8 IMPLANT
GLOVE BIOGEL PI IND STRL 6.5 (GLOVE) ×2 IMPLANT
GLOVE BIOGEL PI IND STRL 7.5 (GLOVE) ×2 IMPLANT
GOWN STRL REUS W/ TWL LRG LVL3 (GOWN DISPOSABLE) ×4 IMPLANT
GOWN STRL REUS W/TWL LRG LVL3 (GOWN DISPOSABLE) ×4
HALF PIN 4MM (EXFIX) IMPLANT
HALF PIN 5.0X160 (EXFIX) IMPLANT
HANDPIECE INTERPULSE COAX TIP (DISPOSABLE)
KIT BASIN OR (CUSTOM PROCEDURE TRAY) ×2 IMPLANT
KIT TURNOVER KIT B (KITS) ×2 IMPLANT
MANIFOLD NEPTUNE II (INSTRUMENTS) ×2 IMPLANT
NS IRRIG 1000ML POUR BTL (IV SOLUTION) ×2 IMPLANT
PACK ORTHO EXTREMITY (CUSTOM PROCEDURE TRAY) ×2 IMPLANT
PAD ARMBOARD 7.5X6 YLW CONV (MISCELLANEOUS) ×4 IMPLANT
PAD CAST 3X4 CTTN HI CHSV (CAST SUPPLIES) IMPLANT
PAD CAST 4YDX4 CTTN HI CHSV (CAST SUPPLIES) IMPLANT
PADDING CAST COTTON 2X4 NS (CAST SUPPLIES) IMPLANT
PADDING CAST COTTON 3X4 STRL (CAST SUPPLIES) ×2
PADDING CAST COTTON 4X4 STRL (CAST SUPPLIES) ×2
PADDING CAST COTTON 6X4 STRL (CAST SUPPLIES) ×2 IMPLANT
PIN CLAMP 2BAR 75MM BLUE (EXFIX) IMPLANT
PIN TRANSFIXING 5.0 (EXFIX) IMPLANT
SET HNDPC FAN SPRY TIP SCT (DISPOSABLE) IMPLANT
SPONGE T-LAP 18X18 ~~LOC~~+RFID (SPONGE) ×2 IMPLANT
SUT ETHILON 2 0 FS 18 (SUTURE) ×4 IMPLANT
SUT ETHILON 3 0 PS 1 (SUTURE) ×4 IMPLANT
SUT MON AB 2-0 CT1 36 (SUTURE) ×2 IMPLANT
SUT PDS AB 0 CT 36 (SUTURE) IMPLANT
SWAB CULTURE ESWAB REG 1ML (MISCELLANEOUS) IMPLANT
TOWEL GREEN STERILE (TOWEL DISPOSABLE) ×4 IMPLANT
TOWEL GREEN STERILE FF (TOWEL DISPOSABLE) ×2 IMPLANT
TUBE CONNECTING 12X1/4 (SUCTIONS) ×2 IMPLANT
UNDERPAD 30X36 HEAVY ABSORB (UNDERPADS AND DIAPERS) ×2 IMPLANT
WATER STERILE IRR 1000ML POUR (IV SOLUTION) ×2 IMPLANT
YANKAUER SUCT BULB TIP NO VENT (SUCTIONS) ×2 IMPLANT

## 2022-09-28 NOTE — TOC CAGE-AID Note (Signed)
Transition of Care [facility]) - CAGE-AID Screening   Patient Details  Name: Judy Wood MRN: [id number] Date of Birth: Oct 19, 1995  [facility]) CM/SW Contact:    Leota Sauers, RN Phone Number: 09/28/2022, 10:28 PM   Clinical Narrative:  Patient endorses use of alcohol, marijuana. Denies needs for resources at this time.  CAGE-AID Screening:    Have You Ever Felt You Ought to Cut Down on Your Drinking or Drug Use?: No Have People Annoyed You By Critizing Your Drinking Or Drug Use?: No Have You Felt Bad Or Guilty About Your Drinking Or Drug Use?: No Have You Ever Had a Drink or Used Drugs First Thing In The Morning to Steady Your Nerves or to Get Rid of a Hangover?: No CAGE-AID Score: 0  Substance Abuse Education Offered: No

## 2022-09-28 NOTE — ED Notes (Signed)
Pt mother updated on condition.  
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
## 2022-09-28 NOTE — ED Notes (Signed)
Pt has not taken her medication for blood pressure in 10 days due to not being able to pick it up.
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
## 2022-09-28 NOTE — Anesthesia Preprocedure Evaluation (Addendum)
Anesthesia Evaluation  Patient identified by MRN, date of birth, ID band Patient awake    Reviewed: Allergy & Precautions, H&P , NPO status , Patient's Chart, lab work & pertinent test results  Airway Mallampati: II   Neck ROM: full    Dental   Pulmonary asthma , Current Smoker   breath sounds clear to auscultation       Cardiovascular hypertension,  Rhythm:regular Rate:Normal     Neuro/Psych  PSYCHIATRIC DISORDERS  Depression       GI/Hepatic   Endo/Other    Renal/GU      Musculoskeletal   Abdominal   Peds  Hematology   Anesthesia Other Findings   Reproductive/Obstetrics                             Anesthesia Physical Anesthesia Plan  ASA: 2  Anesthesia Plan: General   Post-op Pain Management:    Induction: Intravenous  PONV Risk Score and Plan: 2 and Ondansetron, Dexamethasone, Midazolam and Treatment may vary due to age or medical condition  Airway Management Planned: Oral ETT and Video Laryngoscope Planned  Additional Equipment:   Intra-op Plan:   Post-operative Plan: Extubation in OR  Informed Consent: I have reviewed the patients History and Physical, chart, labs and discussed the procedure including the risks, benefits and alternatives for the proposed anesthesia with the patient or authorized representative who has indicated his/her understanding and acceptance.     Dental advisory given  Plan Discussed with: CRNA, Anesthesiologist and Surgeon  Anesthesia Plan Comments:        Anesthesia Quick Evaluation
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
## 2022-09-28 NOTE — Transfer of Care (Signed)
Immediate Anesthesia Transfer of Care Note  Patient: Savanha D Chain  Procedure(s) Performed: IRRIGATION AND DEBRIDEMENT EXTREMITY (Left) External fixation of left ankle (Left: Ankle)  Patient Location: PACU  Anesthesia Type:General  Level of Consciousness: awake, drowsy, and patient cooperative  Airway & Oxygen Therapy: Patient Spontanous Breathing  Post-op Assessment: Report given to RN, Post -op Vital signs reviewed and stable, and Patient moving all extremities X 4  Post vital signs: Reviewed and stable  Last Vitals:  Vitals Value Taken Time  BP 145/95 09/28/22 1103  Temp    Pulse 90 09/28/22 1104  Resp 22 09/28/22 1104  SpO2 95 % 09/28/22 1104  Vitals shown include unfiled device data.  Last Pain:  Vitals:   09/28/22 0833  TempSrc:   PainSc: 10-Worst pain ever         Complications: No notable events documented.
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
## 2022-09-28 NOTE — Anesthesia Procedure Notes (Signed)
Procedure Name: Intubation Date/Time: 09/28/2022 9:37 AM  Performed by: Aundria Rud, CRNAPre-anesthesia Checklist: Patient identified, Patient being monitored, Timeout performed, Emergency Drugs available and Suction available Patient Re-evaluated:Patient Re-evaluated prior to induction Oxygen Delivery Method: Circle system utilized Preoxygenation: Pre-oxygenation with 100% oxygen Induction Type: IV induction Ventilation: Mask ventilation without difficulty and Oral airway inserted - appropriate to patient size Laryngoscope Size: Glidescope and 3 Grade View: Grade I Tube type: Oral Tube size: 7.0 mm Number of attempts: 1 Airway Equipment and Method: Stylet Placement Confirmation: ETT inserted through vocal cords under direct vision, positive ETCO2 and breath sounds checked- equal and bilateral Secured at: 22 cm Tube secured with: Tape Dental Injury: Teeth and Oropharynx as per pre-operative assessment

## 2022-09-28 NOTE — Progress Notes (Signed)
Orthopedic Tech Progress Note Patient Details:  Judy Wood 08/20/1995 [id number]  Ortho Devices Type of Ortho Device: Short leg splint, Stirrup splint Ortho Device/Splint Location: LLE Ortho Device/Splint Interventions: Ordered, Application, Adjustment  Assisted ED MD with splint. Post Interventions Patient Tolerated: Poor  Al Decant 09/28/2022, 5:54 AM
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
## 2022-09-28 NOTE — Interval H&P Note (Signed)
History and Physical Interval Note:  09/28/2022 8:53 AM  Judy Wood  has presented today for surgery, with the diagnosis of left talus fracutre dislocation.  The various methods of treatment have been discussed with the patient and family. After consideration of risks, benefits and other options for treatment, the patient has consented to  Procedure(s): IRRIGATION AND DEBRIDEMENT EXTREMITY (Left) OPEN REDUCTION INTERNAL FIXATION (ORIF) FOOT (Left) as a surgical intervention.  The patient's history has been reviewed, patient examined, no change in status, stable for surgery.  I have reviewed the patient's chart and labs.  Questions were answered to the patient's satisfaction.     Caryn Bee P Fouad Taul

## 2022-09-28 NOTE — ED Notes (Signed)
EDP made aware of hypertension.

## 2022-09-29 ENCOUNTER — Inpatient Hospital Stay (HOSPITAL_COMMUNITY): Payer: Medicaid Other

## 2022-09-29 ENCOUNTER — Encounter (HOSPITAL_COMMUNITY): Payer: Self-pay | Admitting: Student

## 2022-09-29 LAB — CBC
HCT: 33.4 % — ABNORMAL LOW (ref 36.0–46.0)
Hemoglobin: 10.8 g/dL — ABNORMAL LOW (ref 12.0–15.0)
MCH: 29 pg (ref 26.0–34.0)
MCHC: 32.3 g/dL (ref 30.0–36.0)
MCV: 89.8 fL (ref 80.0–100.0)
Platelets: 255 K/uL (ref 150–400)
RBC: 3.72 MIL/uL — ABNORMAL LOW (ref 3.87–5.11)
RDW: 13.1 % (ref 11.5–15.5)
WBC: 8.7 K/uL (ref 4.0–10.5)
nRBC: 0 % (ref 0.0–0.2)

## 2022-09-29 MED ORDER — VITAMIN D 25 MCG (1000 UNIT) PO TABS
2000.0000 [IU] | ORAL_TABLET | Freq: Every day | ORAL | Status: DC
  Administered 2022-09-29 – 2022-10-06 (×7): 2000 [IU] via ORAL
  Filled 2022-09-29 (×8): qty 2

## 2022-09-29 MED ORDER — KETOROLAC TROMETHAMINE 15 MG/ML IJ SOLN
INTRAMUSCULAR | Status: AC
  Filled 2022-09-29: qty 1

## 2022-09-29 NOTE — Progress Notes (Signed)
Inpatient Rehab Admissions Coordinator:   Per therapy recommendations pt was screened for CIR by Estill Dooms, PT, DPT.  Therapy evaluations limited by pain in RLE note x-rayed and pending read.  Surgery planned for definitive fixation of LLE tentative 8/2.  We will f/u for post-op therapy assessment before assessing candidacy for CIR.   Estill Dooms, PT, DPT Admissions Coordinator [phone] 09/29/22  1:28 PM

## 2022-09-29 NOTE — Plan of Care (Signed)
  Problem: Education: Goal: Knowledge of General Education information will improve Description Including pain rating scale, medication(s)/side effects and non-pharmacologic comfort measures Outcome: Progressing   Problem: Health Behavior/Discharge Planning: Goal: Ability to manage health-related needs will improve Outcome: Progressing   Problem: Clinical Measurements: Goal: Diagnostic test results will improve Outcome: Progressing Goal: Respiratory complications will improve Outcome: Progressing Goal: Cardiovascular complication will be avoided Outcome: Progressing   Problem: Activity: Goal: Risk for activity intolerance will decrease Outcome: Progressing   Problem: Nutrition: Goal: Adequate nutrition will be maintained Outcome: Progressing   Problem: Coping: Goal: Level of anxiety will decrease Outcome: Progressing
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
## 2022-09-29 NOTE — Progress Notes (Signed)
Orthopaedic Trauma Progress Note  SUBJECTIVE: Doing okay this morning.  Notes pain in left ankle but this has been fairly well-controlled with the medications.  Denies any significant numbness or tingling about the left lower extremity.  Had a hard time sleeping last night as she states she is a side sleeper and Ex-Fix has limited her from doing this.  No chest pain. No SOB. No nausea/vomiting. No other complaints.   OBJECTIVE:  Vitals:   09/29/22 0524 09/29/22 0738  BP: 135/86 (!) 153/94  Pulse: 82 88  Resp:  18  Temp: 97.7 F (36.5 C) 98.3 F (36.8 C)  SpO2: 99% 99%    General: Sitting up in bed on the phone, no acute distress Respiratory: No increased work of breathing.  Left lower extremity: Ex-Fix in place.  Dressing clean, dry, intact.  Pin site dressings with small amount of bloody drainage.  No output in wound VAC canister.  Endorses sensation over the dorsal aspect of her foot.  Significantly decreased sensation over the plantar aspect of the foot.  Able to wiggle toes a small amount.  Toes warm well-perfused. + DP pulse  IMAGING: Postop CT scan of left ankle shows interval improved alignment of the talar neck fracture.  LABS:  Results for orders placed or performed during the hospital encounter of 09/28/22 (from the past 24 hour(s))  CBC     Status: Abnormal   Collection Time: 09/29/22  4:43 AM  Result Value Ref Range   WBC 8.7 4.0 - 10.5 K/uL   RBC 3.72 (L) 3.87 - 5.11 MIL/uL   Hemoglobin 10.8 (L) 12.0 - 15.0 g/dL   HCT 60.4 (L) 54.0 - 98.1 %   MCV 89.8 80.0 - 100.0 fL   MCH 29.0 26.0 - 34.0 pg   MCHC 32.3 30.0 - 36.0 g/dL   RDW 19.1 47.8 - 29.5 %   Platelets 255 150 - 400 K/uL   nRBC 0.0 0.0 - 0.2 %  Basic metabolic panel     Status: Abnormal   Collection Time: 09/29/22  4:43 AM  Result Value Ref Range   Sodium 135 135 - 145 mmol/L   Potassium 3.6 3.5 - 5.1 mmol/L   Chloride 101 98 - 111 mmol/L   CO2 25 22 - 32 mmol/L   Glucose, Bld 115 (H) 70 - 99 mg/dL   BUN  9 6 - 20 mg/dL   Creatinine, Ser 6.21 0.44 - 1.00 mg/dL   Calcium 8.4 (L) 8.9 - 10.3 mg/dL   GFR, Estimated >30 >86 mL/min   Anion gap 9 5 - 15    ASSESSMENT: Judy Wood is a 27 y.o. female, 1 Day Post-Op s/p IRRIGATION AND DEBRIDEMENT LEFT LOWER EXTREMITY EXTERNAL FIXATION OF LEFT ANKLE  CV/Blood loss: Acute blood loss anemia, Hgb 10.8 this morning. Hemodynamically stable  PLAN: Weightbearing: NWB LLE ROM: Okay for knee motion Incisional and dressing care: Reinforce dressings as needed  Showering: Hold off for now Orthopedic device(s): Ex-Fix LLE Pain management:  1. Tylenol 1000 mg q 6 hours scheduled 2. Robaxin 500 mg q 6 hours PRN 3. Oxycodone 5-15 mg q 4 hours PRN 4. Dilaudid 0.5-1 mg q 4 hours PRN 5.  Toradol 15 mg every 6 hours x 4 doses VTE prophylaxis: Lovenox, SCDs ID: Ceftriaxone postop per open fracture protocol Foley/Lines:  No foley, KVO IVFs Impediments to Fracture Healing: Vitamin D level 21, started on supplementation Dispo: PT/OT evaluation today.  Plan to return to the OR tomorrow 09/30/2022 with Dr.  Haddix for removal of Ex-Fix and definitive fixation of left talar neck fracture.  Please keep patient n.p.o. after midnight.    D/C recommendations: -Oxycodone and Robaxin for pain control -Aspirin for DVT prophylaxis -Continue 2000 units daily Vit D supplementation  Follow - up plan: To be determined   Contact information:  Truitt Merle MD, Thyra Breed PA-C. After hours and holidays please check [email or web address] for group call information for [facility]   Thompson Caul, PA-C [phone] (office) [email or web address]

## 2022-09-29 NOTE — Anesthesia Postprocedure Evaluation (Signed)
Anesthesia Post Note  Patient: Judy Wood  Procedure(s) Performed: IRRIGATION AND DEBRIDEMENT EXTREMITY (Left) External fixation of left ankle (Left: Ankle)     Patient location during evaluation: PACU Anesthesia Type: General Level of consciousness: awake and alert Pain management: pain level controlled Vital Signs Assessment: post-procedure vital signs reviewed and stable Respiratory status: spontaneous breathing, nonlabored ventilation, respiratory function stable and patient connected to nasal cannula oxygen Cardiovascular status: blood pressure returned to baseline and stable Postop Assessment: no apparent nausea or vomiting Anesthetic complications: no   No notable events documented.  Last Vitals:  Vitals:   09/29/22 0524 09/29/22 0738  BP: 135/86 (!) 153/94  Pulse: 82 88  Resp:  18  Temp: 36.5 C 36.8 C  SpO2: 99% 99%    Last Pain:  Vitals:   09/29/22 0524  TempSrc: Oral  PainSc:                  Jovany Disano S
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
## 2022-09-30 ENCOUNTER — Inpatient Hospital Stay (HOSPITAL_COMMUNITY): Payer: Medicaid Other | Admitting: Anesthesiology

## 2022-09-30 ENCOUNTER — Encounter (HOSPITAL_COMMUNITY): Payer: Self-pay | Admitting: Student

## 2022-09-30 ENCOUNTER — Other Ambulatory Visit: Payer: Self-pay

## 2022-09-30 ENCOUNTER — Inpatient Hospital Stay (HOSPITAL_COMMUNITY): Payer: Medicaid Other

## 2022-09-30 ENCOUNTER — Encounter (HOSPITAL_COMMUNITY)
Admission: EM | Disposition: A | Discharge status: Rehabilitation Facility | Payer: Self-pay | Source: Home / Self Care | Attending: Student

## 2022-09-30 DIAGNOSIS — Z6841 Body Mass Index (BMI) 40.0 and over, adult: Secondary | ICD-10-CM

## 2022-09-30 DIAGNOSIS — I10 Essential (primary) hypertension: Secondary | ICD-10-CM

## 2022-09-30 DIAGNOSIS — F17210 Nicotine dependence, cigarettes, uncomplicated: Secondary | ICD-10-CM

## 2022-09-30 DIAGNOSIS — S92112A Displaced fracture of neck of left talus, initial encounter for closed fracture: Secondary | ICD-10-CM | POA: Diagnosis not present

## 2022-09-30 HISTORY — PX: EXTERNAL FIXATION REMOVAL: SHX5040

## 2022-09-30 HISTORY — PX: ORIF CALCANEOUS FRACTURE: SHX5030

## 2022-09-30 SURGERY — OPEN REDUCTION INTERNAL FIXATION (ORIF) CALCANEOUS FRACTURE
Anesthesia: General | Site: Ankle | Laterality: Left

## 2022-09-30 MED ORDER — FENTANYL CITRATE (PF) 250 MCG/5ML IJ SOLN
INTRAMUSCULAR | Status: AC
  Filled 2022-09-30: qty 5

## 2022-09-30 MED ORDER — CHLORHEXIDINE GLUCONATE 4 % EX SOLN: 1.0000 | CUTANEOUS | 1 refills | Status: DC

## 2022-09-30 MED ORDER — VITAMIN D3 25 MCG PO TABS: 2000.0000 [IU] | ORAL_TABLET | Freq: Every day | ORAL | 1 refills | Status: DC

## 2022-09-30 MED ORDER — VANCOMYCIN HCL 1000 MG IV SOLR
INTRAVENOUS | Status: AC
  Filled 2022-09-30: qty 20

## 2022-09-30 MED ORDER — OXYCODONE HCL 5 MG/5ML PO SOLN: 5.0000 mg | Freq: Once | ORAL | Status: DC | PRN

## 2022-09-30 MED ORDER — DEXAMETHASONE SODIUM PHOSPHATE 10 MG/ML IJ SOLN
INTRAMUSCULAR | Status: AC
  Filled 2022-09-30: qty 2

## 2022-09-30 MED ORDER — ROCURONIUM BROMIDE 10 MG/ML (PF) SYRINGE
PREFILLED_SYRINGE | INTRAVENOUS | Status: DC | PRN
  Administered 2022-09-30: 10 mg via INTRAVENOUS
  Administered 2022-09-30: 70 mg via INTRAVENOUS
  Administered 2022-09-30: 20 mg via INTRAVENOUS

## 2022-09-30 MED ORDER — ORAL CARE MOUTH RINSE: 15.0000 mL | Freq: Once | OROMUCOSAL | Status: AC

## 2022-09-30 MED ORDER — PHENYLEPHRINE 80 MCG/ML (10ML) SYRINGE FOR IV PUSH (FOR BLOOD PRESSURE SUPPORT)
PREFILLED_SYRINGE | INTRAVENOUS | Status: DC | PRN
  Administered 2022-09-30: 40 ug via INTRAVENOUS

## 2022-09-30 MED ORDER — CHLORHEXIDINE GLUCONATE 0.12 % MT SOLN
15.0000 mL | Freq: Once | OROMUCOSAL | Status: AC
  Administered 2022-09-30: 15 mL via OROMUCOSAL
  Filled 2022-09-30: qty 15

## 2022-09-30 MED ORDER — OXYCODONE HCL 5 MG PO TABS: 5.0000 mg | ORAL_TABLET | Freq: Once | ORAL | Status: DC | PRN

## 2022-09-30 MED ORDER — ROCURONIUM BROMIDE 10 MG/ML (PF) SYRINGE
PREFILLED_SYRINGE | INTRAVENOUS | Status: AC
  Filled 2022-09-30: qty 10

## 2022-09-30 MED ORDER — AMISULPRIDE (ANTIEMETIC) 5 MG/2ML IV SOLN
INTRAVENOUS | Status: AC
  Filled 2022-09-30: qty 4

## 2022-09-30 MED ORDER — ONDANSETRON HCL 4 MG/2ML IJ SOLN
4.0000 mg | Freq: Once | INTRAMUSCULAR | Status: AC | PRN
  Administered 2022-09-30: 4 mg via INTRAVENOUS

## 2022-09-30 MED ORDER — HYDROMORPHONE HCL 1 MG/ML IJ SOLN
INTRAMUSCULAR | Status: DC | PRN
  Administered 2022-09-30: .5 mg via INTRAVENOUS

## 2022-09-30 MED ORDER — VANCOMYCIN HCL 1000 MG IV SOLR
INTRAVENOUS | Status: DC | PRN
  Administered 2022-09-30: 1000 mg via TOPICAL

## 2022-09-30 MED ORDER — KETOROLAC TROMETHAMINE 15 MG/ML IJ SOLN
15.0000 mg | Freq: Four times a day (QID) | INTRAMUSCULAR | Status: AC
  Administered 2022-09-30 – 2022-10-03 (×12): 15 mg via INTRAVENOUS
  Filled 2022-09-30 (×12): qty 1

## 2022-09-30 MED ORDER — PROPOFOL 10 MG/ML IV BOLUS
INTRAVENOUS | Status: AC
  Filled 2022-09-30: qty 20

## 2022-09-30 MED ORDER — DIPHENHYDRAMINE HCL 50 MG/ML IJ SOLN
INTRAMUSCULAR | Status: AC
  Filled 2022-09-30: qty 1

## 2022-09-30 MED ORDER — 0.9 % SODIUM CHLORIDE (POUR BTL) OPTIME
TOPICAL | Status: DC | PRN
  Administered 2022-09-30: 1000 mL

## 2022-09-30 MED ORDER — LIDOCAINE 2% (20 MG/ML) 5 ML SYRINGE
INTRAMUSCULAR | Status: AC
  Filled 2022-09-30: qty 10

## 2022-09-30 MED ORDER — CHLORHEXIDINE GLUCONATE CLOTH 2 % EX PADS
6.0000 | MEDICATED_PAD | Freq: Every day | CUTANEOUS | Status: AC
  Administered 2022-09-30 – 2022-10-04 (×5): 6 via TOPICAL

## 2022-09-30 MED ORDER — HYDROMORPHONE HCL 1 MG/ML IJ SOLN: 0.2500 mg | INTRAMUSCULAR | Status: DC | PRN

## 2022-09-30 MED ORDER — ONDANSETRON HCL 4 MG/2ML IJ SOLN
INTRAMUSCULAR | Status: DC | PRN
  Administered 2022-09-30: 4 mg via INTRAVENOUS

## 2022-09-30 MED ORDER — MIDAZOLAM HCL 2 MG/2ML IJ SOLN
INTRAMUSCULAR | Status: AC
  Filled 2022-09-30: qty 2

## 2022-09-30 MED ORDER — MUPIROCIN 2 % EX OINT
1.0000 | TOPICAL_OINTMENT | Freq: Two times a day (BID) | CUTANEOUS | Status: AC
  Administered 2022-09-30 – 2022-10-04 (×9): 1 via NASAL
  Filled 2022-09-30 (×6): qty 22

## 2022-09-30 MED ORDER — AMISULPRIDE (ANTIEMETIC) 5 MG/2ML IV SOLN
10.0000 mg | Freq: Once | INTRAVENOUS | Status: AC | PRN
  Administered 2022-09-30: 10 mg via INTRAVENOUS

## 2022-09-30 MED ORDER — LACTATED RINGERS IV SOLN: INTRAVENOUS | Status: DC

## 2022-09-30 MED ORDER — DEXAMETHASONE SODIUM PHOSPHATE 10 MG/ML IJ SOLN
INTRAMUSCULAR | Status: DC | PRN
  Administered 2022-09-30: 10 mg via INTRAVENOUS

## 2022-09-30 MED ORDER — METHOCARBAMOL 500 MG PO TABS: 500.0000 mg | ORAL_TABLET | Freq: Four times a day (QID) | ORAL | 0 refills | Status: DC | PRN

## 2022-09-30 MED ORDER — ONDANSETRON HCL 4 MG/2ML IJ SOLN
INTRAMUSCULAR | Status: AC
  Filled 2022-09-30: qty 4

## 2022-09-30 MED ORDER — FENTANYL CITRATE (PF) 250 MCG/5ML IJ SOLN
INTRAMUSCULAR | Status: DC | PRN
  Administered 2022-09-30 (×2): 50 ug via INTRAVENOUS
  Administered 2022-09-30: 100 ug via INTRAVENOUS

## 2022-09-30 MED ORDER — ONDANSETRON HCL 4 MG/2ML IJ SOLN
INTRAMUSCULAR | Status: AC
  Filled 2022-09-30: qty 2

## 2022-09-30 MED ORDER — HYDRALAZINE HCL 20 MG/ML IJ SOLN
INTRAMUSCULAR | Status: AC
  Filled 2022-09-30: qty 1

## 2022-09-30 MED ORDER — CEFAZOLIN SODIUM-DEXTROSE 2-4 GM/100ML-% IV SOLN
2.0000 g | Freq: Three times a day (TID) | INTRAVENOUS | Status: AC
  Administered 2022-09-30 – 2022-10-01 (×3): 2 g via INTRAVENOUS
  Filled 2022-09-30 (×3): qty 100

## 2022-09-30 MED ORDER — MUPIROCIN 2 % EX OINT: 1.0000 | TOPICAL_OINTMENT | Freq: Two times a day (BID) | CUTANEOUS | 0 refills | Status: DC

## 2022-09-30 MED ORDER — PHENYLEPHRINE 80 MCG/ML (10ML) SYRINGE FOR IV PUSH (FOR BLOOD PRESSURE SUPPORT)
PREFILLED_SYRINGE | INTRAVENOUS | Status: AC
  Filled 2022-09-30: qty 10

## 2022-09-30 MED ORDER — LIDOCAINE 2% (20 MG/ML) 5 ML SYRINGE
INTRAMUSCULAR | Status: DC | PRN
  Administered 2022-09-30: 100 mg via INTRAVENOUS

## 2022-09-30 MED ORDER — APIXABAN 2.5 MG PO TABS: 2.5000 mg | ORAL_TABLET | Freq: Two times a day (BID) | ORAL | 0 refills | Status: DC

## 2022-09-30 MED ORDER — PROPOFOL 10 MG/ML IV BOLUS
INTRAVENOUS | Status: DC | PRN
Start: 2022-09-30 — End: 2022-09-30
  Administered 2022-09-30: 150 mg via INTRAVENOUS

## 2022-09-30 MED ORDER — HYDRALAZINE HCL 20 MG/ML IJ SOLN
10.0000 mg | Freq: Once | INTRAMUSCULAR | Status: AC
  Administered 2022-09-30: 10 mg via INTRAVENOUS

## 2022-09-30 MED ORDER — ACETAMINOPHEN 500 MG PO TABS: 1000.0000 mg | ORAL_TABLET | Freq: Four times a day (QID) | ORAL | Status: DC | PRN

## 2022-09-30 MED ORDER — SUGAMMADEX SODIUM 200 MG/2ML IV SOLN
INTRAVENOUS | Status: DC | PRN
  Administered 2022-09-30: 200 mg via INTRAVENOUS

## 2022-09-30 MED ORDER — OXYCODONE HCL 10 MG PO TABS: 5.0000 mg | ORAL_TABLET | ORAL | 0 refills | Status: DC | PRN

## 2022-09-30 MED ORDER — HYDROMORPHONE HCL 1 MG/ML IJ SOLN
INTRAMUSCULAR | Status: AC
  Filled 2022-09-30: qty 0.5

## 2022-09-30 MED ORDER — SUCCINYLCHOLINE CHLORIDE 200 MG/10ML IV SOSY
PREFILLED_SYRINGE | INTRAVENOUS | Status: DC | PRN
  Administered 2022-09-30: 140 mg via INTRAVENOUS

## 2022-09-30 SURGICAL SUPPLY — 77 items
APL PRP STRL LF DISP 70% ISPRP (MISCELLANEOUS)
BAG COUNTER SPONGE SURGICOUNT (BAG) ×2 IMPLANT
BAG SPNG CNTER NS LX DISP (BAG) ×2
BANDAGE ESMARK 6X9 LF (GAUZE/BANDAGES/DRESSINGS) ×2 IMPLANT
BIT DRILL 2 FAST STEP (BIT) IMPLANT
BIT DRILL 3.0 6IN (DRILL) IMPLANT
BLADE SURG 10 STRL SS (BLADE) ×2 IMPLANT
BNDG CMPR 5X4 KNIT ELC UNQ LF (GAUZE/BANDAGES/DRESSINGS) ×2
BNDG CMPR 6 X 5 YARDS HK CLSR (GAUZE/BANDAGES/DRESSINGS) ×2
BNDG CMPR 9X6 STRL LF SNTH (GAUZE/BANDAGES/DRESSINGS)
BNDG COHESIVE 4X5 TAN STRL (GAUZE/BANDAGES/DRESSINGS) ×2 IMPLANT
BNDG ELASTIC 4INX 5YD STR LF (GAUZE/BANDAGES/DRESSINGS) IMPLANT
BNDG ELASTIC 4X5.8 VLCR STR LF (GAUZE/BANDAGES/DRESSINGS) ×2 IMPLANT
BNDG ELASTIC 6INX 5YD STR LF (GAUZE/BANDAGES/DRESSINGS) IMPLANT
BNDG ELASTIC 6X5.8 VLCR STR LF (GAUZE/BANDAGES/DRESSINGS) ×2 IMPLANT
BNDG ESMARK 6X9 LF (GAUZE/BANDAGES/DRESSINGS)
BONE CANC CHIPS 20CC PCAN1/4 (Bone Implant) ×2 IMPLANT
BRUSH SCRUB EZ PLAIN DRY (MISCELLANEOUS) ×4 IMPLANT
CHIPS CANC BONE 20CC PCAN1/4 (Bone Implant) ×2 IMPLANT
CHLORAPREP W/TINT 26 (MISCELLANEOUS) ×2 IMPLANT
CONNECTOR 5 IN 1 STRAIGHT STRL (MISCELLANEOUS) ×2 IMPLANT
COVER MAYO STAND STRL (DRAPES) ×2 IMPLANT
COVER SURGICAL LIGHT HANDLE (MISCELLANEOUS) ×4 IMPLANT
DRAPE C-ARM 42X72 X-RAY (DRAPES) ×2 IMPLANT
DRAPE C-ARMOR (DRAPES) ×2 IMPLANT
DRAPE INCISE IOBAN 66X45 STRL (DRAPES) ×2 IMPLANT
DRAPE ORTHO SPLIT 77X108 STRL (DRAPES) ×4
DRAPE SURG ORHT 6 SPLT 77X108 (DRAPES) ×4 IMPLANT
DRAPE U-SHAPE 47X51 STRL (DRAPES) ×2 IMPLANT
DRILL 3.0 6IN (DRILL) ×2
DRSG EMULSION OIL 3X3 NADH (GAUZE/BANDAGES/DRESSINGS) ×2 IMPLANT
DRSG MEPITEL 4X7.2 (GAUZE/BANDAGES/DRESSINGS) IMPLANT
ELECT REM PT RETURN 9FT ADLT (ELECTROSURGICAL)
ELECTRODE REM PT RTRN 9FT ADLT (ELECTROSURGICAL) ×2 IMPLANT
GAUZE PAD ABD 8X10 STRL (GAUZE/BANDAGES/DRESSINGS) IMPLANT
GAUZE SPONGE 4X4 12PLY STRL (GAUZE/BANDAGES/DRESSINGS) ×2 IMPLANT
GLOVE BIO SURGEON STRL SZ 6.5 (GLOVE) ×6 IMPLANT
GLOVE BIO SURGEON STRL SZ7.5 (GLOVE) ×8 IMPLANT
GLOVE BIOGEL PI IND STRL 6.5 (GLOVE) ×2 IMPLANT
GLOVE BIOGEL PI IND STRL 7.5 (GLOVE) ×2 IMPLANT
GOWN STRL REUS W/ TWL LRG LVL3 (GOWN DISPOSABLE) ×4 IMPLANT
GOWN STRL REUS W/TWL LRG LVL3 (GOWN DISPOSABLE) ×4
GRAFT BNE CANC CHIPS 1-8 20CC (Bone Implant) IMPLANT
GUIDEWIRE 1.6 6IN (WIRE) IMPLANT
GUIDEWIRE TYB 2X9 (WIRE) IMPLANT
KIT BASIN OR (CUSTOM PROCEDURE TRAY) ×2 IMPLANT
KIT TURNOVER KIT B (KITS) ×2 IMPLANT
MANIFOLD NEPTUNE II (INSTRUMENTS) ×2 IMPLANT
NDL HYPO 21X1.5 SAFETY (NEEDLE) IMPLANT
NEEDLE HYPO 21X1.5 SAFETY (NEEDLE) IMPLANT
NS IRRIG 1000ML POUR BTL (IV SOLUTION) ×2 IMPLANT
PACK ORTHO EXTREMITY (CUSTOM PROCEDURE TRAY) ×2 IMPLANT
PAD ARMBOARD 7.5X6 YLW CONV (MISCELLANEOUS) ×4 IMPLANT
PAD CAST 4YDX4 CTTN HI CHSV (CAST SUPPLIES) ×4 IMPLANT
PAD CAST CTTN 4X4 STRL (SOFTGOODS) IMPLANT
PADDING CAST COTTON 4X4 STRL (CAST SUPPLIES)
PADDING CAST COTTON 4X4 STRL (SOFTGOODS) ×4
PADDING CAST COTTON 6X4 STRL (CAST SUPPLIES) ×2 IMPLANT
SCREW CANN HDLS SHT 6.5X70 (Screw) IMPLANT
SCREW CANN SHT HDLS 4X38 (Screw) IMPLANT
SCREW CANN SHT HDLS 4X42 (Screw) IMPLANT
SCREW SHANZ 4.0X60MM (EXFIX) IMPLANT
SPLINT PLASTER CAST XFAST 5X30 (CAST SUPPLIES) IMPLANT
SPONGE T-LAP 18X18 ~~LOC~~+RFID (SPONGE) IMPLANT
SUCTION TUBE FRAZIER 10FR DISP (SUCTIONS) ×2 IMPLANT
SUT ETHILON 3 0 PS 1 (SUTURE) ×4 IMPLANT
SUT VIC AB 0 CT1 27 (SUTURE) ×2
SUT VIC AB 0 CT1 27XBRD ANBCTR (SUTURE) ×2 IMPLANT
SUT VIC AB 2-0 CT1 (SUTURE) IMPLANT
SUT VIC AB 2-0 CT1 27 (SUTURE)
SUT VIC AB 2-0 CT1 TAPERPNT 27 (SUTURE) IMPLANT
SYR CONTROL 10ML LL (SYRINGE) IMPLANT
TOWEL GREEN STERILE (TOWEL DISPOSABLE) ×4 IMPLANT
TOWEL GREEN STERILE FF (TOWEL DISPOSABLE) ×2 IMPLANT
TUBE CONNECTING 12X1/4 (SUCTIONS) ×4 IMPLANT
UNDERPAD 30X36 HEAVY ABSORB (UNDERPADS AND DIAPERS) ×2 IMPLANT
WATER STERILE IRR 1000ML POUR (IV SOLUTION) ×2 IMPLANT

## 2022-09-30 NOTE — Anesthesia Postprocedure Evaluation (Signed)
Anesthesia Post Note  Patient: Judy Wood  Procedure(s) Performed: OPEN REDUCTION INTERNAL FIXATION (ORIF) TALUS FRACTURE (Left) REMOVAL EXTERNAL FIXATION LEG (Left: Ankle)     Patient location during evaluation: PACU Anesthesia Type: General Level of consciousness: awake and alert Pain management: pain level controlled Vital Signs Assessment: post-procedure vital signs reviewed and stable Respiratory status: spontaneous breathing, nonlabored ventilation, respiratory function stable and patient connected to nasal cannula oxygen Cardiovascular status: blood pressure returned to baseline and stable Postop Assessment: no apparent nausea or vomiting Anesthetic complications: no  No notable events documented.  Last Vitals:  Vitals:   09/30/22 1300 09/30/22 1327  BP: (!) 161/97 (!) 163/104  Pulse: 96 100  Resp: 20 18  Temp: 36.5 C 37.1 C  SpO2: 95% 96%    Last Pain:  Vitals:   09/30/22 1327  TempSrc: Oral  PainSc: 0-No pain                 Vinnie Gombert S

## 2022-09-30 NOTE — Progress Notes (Signed)
Ortho Trauma Note  We will plan to proceed with formal open reduction internal fixation of the talar neck.  She also has a large osteochondral fragment in the lateral talar process fragment which in addition to her subtalar dislocation puts her at high risk of subtalar arthritis.  Due to this concern we will plan to proceed with subtalar fusion at the same time and is open reduction internal fixation of the talar neck as she is likely significantly high risk for arthritic changes and dysfunction due to this.  Risks and benefits were discussed with the patient.  She agreed to proceed with surgery and consent was obtained.  Regards to right lower extremity she has a proximal fibula fracture.  Her mortise appears to be well reduced.  We can continue with weightbearing as tolerated to the right lower extremity.  I discussed this with her at bedside today.  Roby Lofts, MD [facility] [phone] (office) [email or web address]

## 2022-09-30 NOTE — Anesthesia Preprocedure Evaluation (Addendum)
Anesthesia Evaluation  Patient identified by MRN, date of birth, ID band Patient awake    Reviewed: Allergy & Precautions, H&P , NPO status , Patient's Chart, lab work & pertinent test results  Airway Mallampati: II  TM Distance: >3 FB Neck ROM: Full    Dental no notable dental hx.    Pulmonary Current Smoker and Patient abstained from smoking.   Pulmonary exam normal breath sounds clear to auscultation       Cardiovascular hypertension, Normal cardiovascular exam Rhythm:Regular Rate:Normal  Uncontrolled HTN   Neuro/Psych negative neurological ROS  negative psych ROS   GI/Hepatic negative GI ROS, Neg liver ROS,,,  Endo/Other    Morbid obesity  Renal/GU negative Renal ROS  negative genitourinary   Musculoskeletal negative musculoskeletal ROS (+)    Abdominal   Peds negative pediatric ROS (+)  Hematology negative hematology ROS (+)   Anesthesia Other Findings   Reproductive/Obstetrics negative OB ROS                             Anesthesia Physical Anesthesia Plan  ASA: 3  Anesthesia Plan: General   Post-op Pain Management: Toradol IV (intra-op)*   Induction: Intravenous  PONV Risk Score and Plan: 2 and Dexamethasone and Treatment may vary due to age or medical condition  Airway Management Planned: Oral ETT and Video Laryngoscope Planned  Additional Equipment:   Intra-op Plan:   Post-operative Plan: Extubation in OR  Informed Consent: I have reviewed the patients History and Physical, chart, labs and discussed the procedure including the risks, benefits and alternatives for the proposed anesthesia with the patient or authorized representative who has indicated his/her understanding and acceptance.     Dental advisory given  Plan Discussed with: CRNA and Surgeon  Anesthesia Plan Comments:        Anesthesia Quick Evaluation

## 2022-09-30 NOTE — H&P (View-Only) (Signed)
Ortho Trauma Note  We will plan to proceed with formal open reduction internal fixation of the talar neck.  She also has a large osteochondral fragment in the lateral talar process fragment which in addition to her subtalar dislocation puts her at high risk of subtalar arthritis.  Due to this concern we will plan to proceed with subtalar fusion at the same time and is open reduction internal fixation of the talar neck as she is likely significantly high risk for arthritic changes and dysfunction due to this.  Risks and benefits were discussed with the patient.  She agreed to proceed with surgery and consent was obtained.  Regards to right lower extremity she has a proximal fibula fracture.  Her mortise appears to be well reduced.  We can continue with weightbearing as tolerated to the right lower extremity.  I discussed this with her at bedside today.  Roby Lofts, MD [facility] [phone] (office) [email or web address]

## 2022-09-30 NOTE — Discharge Instructions (Signed)
Truitt Merle, MD Ulyses Southward PA-C [facility] [address] [phone] Jani Files)   [phone] (fax)                                  POST-OPERATIVE INSTRUCTIONS     WEIGHT BEARING STATUS: non-weightbearing left leg  RANGE OF MOTION/ACTIVITY: Knee motion as tolerated. Work on moving toes  WOUND CARE Please keep splint clean dry and intact until follow-up. If your splint gets wet for any reason please contact the office immediately.  Do not stick anything down your splint such as pencils, momey, hangers to try and scratch yourself.  If you feel itchy take Benadryl as prescribed on the bottle for itching You may shower on Post-Op Day #2.  You must keep splint dry during this process and may find that a plastic bag taped around the extremity or alternatively a towel based bath may be a better option.   If you get your splint wet or if it is damaged please contact our clinic.  EXERCISES Due to your splint being in place you will not be able to bear weight through your extremity.   DO NOT PUT ANY WEIGHT ON YOUR OPERATIVE LEG Please use crutches or a walker to avoid weight bearing.   DVT/PE prophylaxis:  Eliquis x 30 days  DIET: As you were eating previously.  Can use over the counter stool softeners and bowel preparations, such as Miralax, to help with bowel movements.  Narcotics can be constipating.  Be sure to drink plenty of fluids  REGIONAL ANESTHESIA (NERVE BLOCKS) The anesthesia team may have performed a nerve block for you if safe in the setting of your care.  This is a great tool used to minimize pain.  Typically the block may start wearing off overnight but the long acting medicine may last for 3-4 days.  The nerve block wearing off can be a challenging period but please utilize your as needed pain medications to try and manage this period.    POST-OP MEDICATIONS- Multimodal approach to pain control  In general your pain will be controlled with a  combination of substances.  Prescriptions unless otherwise discussed are electronically sent to your pharmacy.  This is a carefully made plan we use to minimize narcotic use.     - Acetaminophen - Non-narcotic pain medicine taken on a scheduled basis   - Oxycodone - This is a strong narcotic, to be used only on an "as needed" basis for pain.  -  Eliquis 2.5 mg- This medicine is used to minimize the risk of blood clots after surgery.   FOLLOW-UP If you develop a Fever (>101.5), Redness or Drainage from the surgical incision site, please call our office to arrange for an evaluation. Please call the office to schedule a follow-up appointment for your incision check if you do not already have one, 7-10 days post-operatively.   VISIT OUR WEBSITE FOR ADDITIONAL INFORMATION: [email or web address]   HELPFUL INFORMATION  If you had a block, it will wear off between 8-24 hrs postop typically.  This is period when your pain may go from nearly zero to the pain you would have had postop without the block.  This is an abrupt transition but nothing dangerous is happening.  You may take an extra dose of narcotic when this happens.  You should wean off your narcotic medicines as soon as you are able.  Most patients will  be off or using minimal narcotics before their first postop appointment.   We suggest you use the pain medication the first night prior to going to bed, in order to ease any pain when the anesthesia wears off. You should avoid taking pain medications on an empty stomach as it will make you nauseous.  Do not drink alcoholic beverages or take illicit drugs when taking pain medications.  In most states it is against the law to drive while you are in a splint or sling.  And certainly against the law to drive while taking narcotics.  You may return to work/school in the next couple of days when you feel up to it.   Pain medication may make you constipated.  Below are a few solutions to try in this  order: Decrease the amount of pain medication if you aren't having pain. Drink lots of decaffeinated fluids. Drink prune juice and/or each dried prunes  If the first 3 don't work start with additional solutions Take Colace - an over-the-counter stool softener Take Senokot - an over-the-counter laxative Take Miralax - a stronger over-the-counter laxative

## 2022-09-30 NOTE — Interval H&P Note (Signed)
History and Physical Interval Note:  09/30/2022 7:55 AM  Judy Wood  has presented today for surgery, with the diagnosis of Talus fracture dislocation.  The various methods of treatment have been discussed with the patient and family. After consideration of risks, benefits and other options for treatment, the patient has consented to  Procedure(s): OPEN REDUCTION INTERNAL FIXATION (ORIF) TALUS FRACTURE (Left) as a surgical intervention.  The patient's history has been reviewed, patient examined, no change in status, stable for surgery.  I have reviewed the patient's chart and labs.  Questions were answered to the patient's satisfaction.     Caryn Bee P Gaddiel Cullens
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
## 2022-09-30 NOTE — Progress Notes (Signed)
Orthopedic Tech Progress Note Patient Details:  Judy Wood Jan 17, 1996 [id number]  Patient ID: [name], female   DOB: 09-Jun-1995, 27 y.o.   MRN: [id number] Patient exceeds weight limit for OHF. Darleen Crocker 09/30/2022, 1:49 PM

## 2022-09-30 NOTE — Transfer of Care (Signed)
Immediate Anesthesia Transfer of Care Note  Patient: Judy Wood  Procedure(s) Performed: OPEN REDUCTION INTERNAL FIXATION (ORIF) TALUS FRACTURE (Left) REMOVAL EXTERNAL FIXATION LEG (Left: Ankle)  Patient Location: PACU  Anesthesia Type:General  Level of Consciousness: awake, alert , and oriented  Airway & Oxygen Therapy: Patient Spontanous Breathing and Patient connected to face mask oxygen  Post-op Assessment: Report given to RN, Post -op Vital signs reviewed and stable, Patient moving all extremities X 4, and Patient able to stick tongue midline  Post vital signs: Reviewed and stable  Last Vitals:  Vitals Value Taken Time  BP 136/92 09/30/22 1136  Temp 97.7   Pulse 94 09/30/22 1137  Resp 19 09/30/22 1137  SpO2 90 % 09/30/22 1137  Vitals shown include unfiled device data.  Last Pain:  Vitals:   09/30/22 0745  TempSrc: Oral  PainSc:       Patients Stated Pain Goal: 0 (09/30/22 0226)  Complications: No notable events documented.

## 2022-09-30 NOTE — Anesthesia Procedure Notes (Signed)
Procedure Name: Intubation Date/Time: 09/30/2022 8:38 AM  Performed by: Marena Chancy, CRNAPre-anesthesia Checklist: Patient identified, Emergency Drugs available, Suction available and Patient being monitored Patient Re-evaluated:Patient Re-evaluated prior to induction Oxygen Delivery Method: Circle System Utilized Preoxygenation: Pre-oxygenation with 100% oxygen Induction Type: IV induction Laryngoscope Size: Glidescope and 3 Grade View: Grade I Tube type: Oral Tube size: 7.5 mm Number of attempts: 1 Airway Equipment and Method: Stylet and Oral airway Placement Confirmation: ETT inserted through vocal cords under direct vision, positive ETCO2 and breath sounds checked- equal and bilateral Tube secured with: Tape Dental Injury: Teeth and Oropharynx as per pre-operative assessment

## 2022-09-30 NOTE — Progress Notes (Signed)
PT Cancellation Note  Patient Details Name: Judy Wood MRN: [id number] DOB: December 29, 1995   Cancelled Treatment:    Reason Eval/Treat Not Completed: Patient at procedure or test/unavailable, pt off unit at OR. Will continue to follow and re-evaluate as time/schedule allows.   Vickki Muff, PT, DPT   Acute Rehabilitation Department Office [phone] Secure Chat Communication Preferred   Ronnie Derby 09/30/2022, 9:59 AM
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
## 2022-10-01 NOTE — Progress Notes (Signed)
Subjective: 1 Day Post-Op s/p Procedure(s): OPEN REDUCTION INTERNAL FIXATION (ORIF) TALUS FRACTURE REMOVAL EXTERNAL FIXATION LEG   Patient is alert, oriented. Denies any pain, states feeling is completely back in left foot. Painful to weight bear at all on right foot. Denies chest pain, SOB, Calf pain. No nausea/vomiting. No other complaints.   Objective:  PE: VITALS:   Vitals:   09/30/22 1926 09/30/22 2345 10/01/22 0402 10/01/22 0721  BP: (!) 168/98 (!) 159/98 (!) 139/94 (!) 155/97  Pulse: 98 (!) 103 94 92  Resp: 17 16 19    Temp: 99 F (37.2 C) 98.2 F (36.8 C) 99 F (37.2 C) 98.2 F (36.8 C)  TempSrc:    Oral  SpO2: 95% 92% 95% 97%  Weight:      Height:       General: laying in bed, sleeping, in no acute distress Resp: normal respiratory effort Cardio: normal rate and rhythm MSK: RLE - dorsiflexion and plantarflexion intact without pain. TTP to proximal fibula, full ROM of right knee without pain. Distal sensation intact, foot warm and well perfused.  LLE - in splint, full ROM of left knee without pain. Flicker of movement at all toes. Denies sensation to toes. Cap refill intact to toes.    LABS  Results for orders placed or performed during the hospital encounter of 09/28/22 (from the past 24 hour(s))  CBC     Status: Abnormal   Collection Time: 10/01/22  1:49 AM  Result Value Ref Range   WBC 13.8 (H) 4.0 - 10.5 K/uL   RBC 3.60 (L) 3.87 - 5.11 MIL/uL   Hemoglobin 10.1 (L) 12.0 - 15.0 g/dL   HCT 78.2 (L) 95.6 - 21.3 %   MCV 88.6 80.0 - 100.0 fL   MCH 28.1 26.0 - 34.0 pg   MCHC 31.7 30.0 - 36.0 g/dL   RDW 08.6 57.8 - 46.9 %   Platelets 296 150 - 400 K/uL   nRBC 0.0 0.0 - 0.2 %  Basic metabolic panel     Status: Abnormal   Collection Time: 10/01/22  1:49 AM  Result Value Ref Range   Sodium 136 135 - 145 mmol/L   Potassium 4.1 3.5 - 5.1 mmol/L   Chloride 103 98 - 111 mmol/L   CO2 25 22 - 32 mmol/L   Glucose, Bld 125 (H) 70 - 99 mg/dL   BUN 14 6 - 20  mg/dL   Creatinine, Ser 6.29 0.44 - 1.00 mg/dL   Calcium 8.7 (L) 8.9 - 10.3 mg/dL   GFR, Estimated >52 >84 mL/min   Anion gap 8 5 - 15    DG Ankle Complete Left  Result Date: 09/30/2022 CLINICAL DATA:  Fracture.  Postoperative. EXAM: LEFT ANKLE COMPLETE - 3+ VIEW; LEFT OS CALCIS - 2+ VIEW COMPARISON:  Left ankle radiographs 09/28/2022, CT left ankle 09/28/2022 FINDINGS: There is overlying casting material that limits evaluation of fine bony detail. There is improved alignment of the previously seen displaced and markedly comminuted fracture of the junction of the talar body and neck following fixation with two longitudinal screws. There is new fixation of the posterior subtalar joint with two long screws. The ankle mortise is intact. Minimal relative widening of the lateral tibiotalar space (6 mm) with respect to the medial talar tibiotalar space (4-5 mm). IMPRESSION: 1. Improved alignment of the previously seen displaced and markedly comminuted fracture of the junction of the talar body and neck following fixation. 2. New fixation of the posterior subtalar  joint. Electronically Signed   By: Neita Garnet M.D.   On: 09/30/2022 13:29   DG Os Calcis Left  Result Date: 09/30/2022 CLINICAL DATA:  Fracture.  Postoperative. EXAM: LEFT ANKLE COMPLETE - 3+ VIEW; LEFT OS CALCIS - 2+ VIEW COMPARISON:  Left ankle radiographs 09/28/2022, CT left ankle 09/28/2022 FINDINGS: There is overlying casting material that limits evaluation of fine bony detail. There is improved alignment of the previously seen displaced and markedly comminuted fracture of the junction of the talar body and neck following fixation with two longitudinal screws. There is new fixation of the posterior subtalar joint with two long screws. The ankle mortise is intact. Minimal relative widening of the lateral tibiotalar space (6 mm) with respect to the medial talar tibiotalar space (4-5 mm). IMPRESSION: 1. Improved alignment of the previously seen  displaced and markedly comminuted fracture of the junction of the talar body and neck following fixation. 2. New fixation of the posterior subtalar joint. Electronically Signed   By: Neita Garnet M.D.   On: 09/30/2022 13:29   DG Foot Complete Left  Result Date: 09/30/2022 CLINICAL DATA:  Postoperative.  Fracture. EXAM: LEFT FOOT - COMPLETE 3+ VIEW COMPARISON:  Left ankle radiographs 09/28/2022, CT left ankle 09/28/2022 FINDINGS: Images were performed intraoperatively without the presence of a radiologist. Interval removal of the external fixator. There is felt to longitudinal screw fixation of the previously seen displaced fracture of the talar body and neck. There is two screw fixation of the posterior subtalar joint. There is markedly improved alignment. Total fluoroscopy images: 11 Please see intraoperative findings for further detail. IMPRESSION: 1. Interval removal of the external fixator. 2. Screw fixation of the previously seen displaced fracture of the talar body and neck and screw fixation of the posterior subtalar joint. Electronically Signed   By: Neita Garnet M.D.   On: 09/30/2022 13:27   DG C-Arm 1-60 Min-No Report  Result Date: 09/30/2022 Fluoroscopy was utilized by the requesting physician.  No radiographic interpretation.   DG C-Arm 1-60 Min-No Report  Result Date: 09/30/2022 Fluoroscopy was utilized by the requesting physician.  No radiographic interpretation.   DG Ankle Complete Right  Result Date: 09/29/2022 CLINICAL DATA:  Pain. EXAM: RIGHT ANKLE - COMPLETE 3+ VIEW COMPARISON:  None Available. FINDINGS: The ankle mortise is symmetric and intact. Normal bone mineralization. Joint spaces are preserved. No acute fracture is seen. No dislocation. Densities from the patient's none slip sock overlie portions of the visualized foot. IMPRESSION: No acute fracture. Electronically Signed   By: Neita Garnet M.D.   On: 09/29/2022 14:06   DG Knee 1-2 Views Right  Result Date:  09/29/2022 CLINICAL DATA:  Pain. EXAM: RIGHT KNEE - 1-2 VIEW COMPARISON:  None Available. FINDINGS: There is an acute, mildly comminuted fracture of the proximal fibular diaphysis spanning an approximate 5 cm length of the bone. There is 3 mm distal lateral cortical step-off of the fracture on frontal view. No significant displacement on lateral view. No knee joint effusion. IMPRESSION: Acute, mildly comminuted fracture of the proximal fibular diaphysis. Electronically Signed   By: Neita Garnet M.D.   On: 09/29/2022 14:05    Assessment/Plan: Principal Problem:   Displaced fracture of neck of left talus, initial encounter for open fracture Right proximal fibula fracture   S/p ORIF left talar neck fracutre, left subtalar fusion, removal of external fixation  09/30/22  Weightbearing: NWB LLE, WBAT RLE Insicional and dressing care: Reinforce dressings as needed Orthopedic device(s): Splint VTE prophylaxis: Lovenox 40mg   qd while inpatient Pain control: continue current regimen, block does not appear to have worn off yet completely.  Tylenol 1000 mg q 6 hrs Toradol 15 mg IV q 6 hrs for 3 days Oxycodone 5-15 mg q 4 prn for moderate-severe pain Robaxin 500 mg q 6 hrs PRN  Dilaudid 0.5-1 mg IV q 4 hours breakthrough pain Dispo: pending PT/OT eval today  Contact information:   Janine Ores, PA-C Weekdays 8-5  After hours and holidays please check [email or web address] for group call information for [facility]  Armida Sans 10/01/2022, 9:10 AM

## 2022-10-02 LAB — CBC
HCT: 28.4 % — ABNORMAL LOW (ref 36.0–46.0)
Hemoglobin: 9.2 g/dL — ABNORMAL LOW (ref 12.0–15.0)
MCH: 28.8 pg (ref 26.0–34.0)
MCHC: 32.4 g/dL (ref 30.0–36.0)
MCV: 89 fL (ref 80.0–100.0)
Platelets: 255 10*3/uL (ref 150–400)
RBC: 3.19 MIL/uL — ABNORMAL LOW (ref 3.87–5.11)
RDW: 13.1 % (ref 11.5–15.5)
WBC: 10.2 10*3/uL (ref 4.0–10.5)
nRBC: 0 % (ref 0.0–0.2)

## 2022-10-02 NOTE — Progress Notes (Signed)
Subjective: 2 Days Post-Op s/p Procedure(s): OPEN REDUCTION INTERNAL FIXATION (ORIF) TALUS FRACTURE REMOVAL EXTERNAL FIXATION LEG   Patient is alert, oriented. Denies significant pain, feels like block has worn off. Painful to weight bear at all on right foot with PT yesterday. Wants more time to work with PT. Denies chest pain, SOB, Calf pain. No nausea/vomiting. No other complaints.   Objective:  PE: VITALS:   Vitals:   10/01/22 1512 10/01/22 2028 10/02/22 0354 10/02/22 0742  BP: 130/84 (!) 151/96 121/73 (!) 150/97  Pulse: (!) 103 100 86 83  Resp:  18 14 18   Temp: 97.9 F (36.6 C) 98.6 F (37 C) 98.5 F (36.9 C) 97.8 F (36.6 C)  TempSrc: Oral Oral Oral Oral  SpO2: 96% 97% 98% 98%  Weight:      Height:       General: laying in bed, sleeping, in no acute distress Resp: normal respiratory effort Cardio: normal rate and rhythm MSK: RLE - dorsiflexion and plantarflexion intact without pain. TTP to proximal fibula, full ROM of right knee without pain. Distal sensation intact, foot warm and well perfused.  LLE - in splint, full ROM of left knee without pain. Able to flexa and extend all toes. Sensation intact to toes. Cap refill intact to toes.    LABS  Results for orders placed or performed during the hospital encounter of 09/28/22 (from the past 24 hour(s))  CBC     Status: Abnormal   Collection Time: 10/02/22  2:12 AM  Result Value Ref Range   WBC 10.2 4.0 - 10.5 K/uL   RBC 3.19 (L) 3.87 - 5.11 MIL/uL   Hemoglobin 9.2 (L) 12.0 - 15.0 g/dL   HCT 16.1 (L) 09.6 - 04.5 %   MCV 89.0 80.0 - 100.0 fL   MCH 28.8 26.0 - 34.0 pg   MCHC 32.4 30.0 - 36.0 g/dL   RDW 40.9 81.1 - 91.4 %   Platelets 255 150 - 400 K/uL   nRBC 0.0 0.0 - 0.2 %    DG Ankle Complete Left  Result Date: 09/30/2022 CLINICAL DATA:  Fracture.  Postoperative. EXAM: LEFT ANKLE COMPLETE - 3+ VIEW; LEFT OS CALCIS - 2+ VIEW COMPARISON:  Left ankle radiographs 09/28/2022, CT left ankle 09/28/2022 FINDINGS:  There is overlying casting material that limits evaluation of fine bony detail. There is improved alignment of the previously seen displaced and markedly comminuted fracture of the junction of the talar body and neck following fixation with two longitudinal screws. There is new fixation of the posterior subtalar joint with two long screws. The ankle mortise is intact. Minimal relative widening of the lateral tibiotalar space (6 mm) with respect to the medial talar tibiotalar space (4-5 mm). IMPRESSION: 1. Improved alignment of the previously seen displaced and markedly comminuted fracture of the junction of the talar body and neck following fixation. 2. New fixation of the posterior subtalar joint. Electronically Signed   By: Neita Garnet M.D.   On: 09/30/2022 13:29   DG Os Calcis Left  Result Date: 09/30/2022 CLINICAL DATA:  Fracture.  Postoperative. EXAM: LEFT ANKLE COMPLETE - 3+ VIEW; LEFT OS CALCIS - 2+ VIEW COMPARISON:  Left ankle radiographs 09/28/2022, CT left ankle 09/28/2022 FINDINGS: There is overlying casting material that limits evaluation of fine bony detail. There is improved alignment of the previously seen displaced and markedly comminuted fracture of the junction of the talar body and neck following fixation with two longitudinal screws. There is new fixation of  the posterior subtalar joint with two long screws. The ankle mortise is intact. Minimal relative widening of the lateral tibiotalar space (6 mm) with respect to the medial talar tibiotalar space (4-5 mm). IMPRESSION: 1. Improved alignment of the previously seen displaced and markedly comminuted fracture of the junction of the talar body and neck following fixation. 2. New fixation of the posterior subtalar joint. Electronically Signed   By: Neita Garnet M.D.   On: 09/30/2022 13:29   DG Foot Complete Left  Result Date: 09/30/2022 CLINICAL DATA:  Postoperative.  Fracture. EXAM: LEFT FOOT - COMPLETE 3+ VIEW COMPARISON:  Left ankle  radiographs 09/28/2022, CT left ankle 09/28/2022 FINDINGS: Images were performed intraoperatively without the presence of a radiologist. Interval removal of the external fixator. There is felt to longitudinal screw fixation of the previously seen displaced fracture of the talar body and neck. There is two screw fixation of the posterior subtalar joint. There is markedly improved alignment. Total fluoroscopy images: 11 Please see intraoperative findings for further detail. IMPRESSION: 1. Interval removal of the external fixator. 2. Screw fixation of the previously seen displaced fracture of the talar body and neck and screw fixation of the posterior subtalar joint. Electronically Signed   By: Neita Garnet M.D.   On: 09/30/2022 13:27   DG C-Arm 1-60 Min-No Report  Result Date: 09/30/2022 Fluoroscopy was utilized by the requesting physician.  No radiographic interpretation.   DG C-Arm 1-60 Min-No Report  Result Date: 09/30/2022 Fluoroscopy was utilized by the requesting physician.  No radiographic interpretation.    Assessment/Plan: Principal Problem:   Displaced fracture of neck of left talus, initial encounter for open fracture Right proximal fibula fracture   S/p ORIF left talar neck fracutre, left subtalar fusion, removal of external fixation  09/30/22  Weightbearing: NWB LLE, WBAT RLE Insicional and dressing care: Reinforce dressings as needed Orthopedic device(s): Splint VTE prophylaxis: Lovenox 40mg  qd while inpatient Pain control: continue current regimen, block does not appear to have worn off yet completely.  Tylenol 1000 mg q 6 hrs Toradol 15 mg IV q 6 hrs for 3 days Oxycodone 5-15 mg q 4 prn for moderate-severe pain Robaxin 500 mg q 6 hrs PRN  Dilaudid 0.5-1 mg IV q 4 hours breakthrough pain Dispo: plan to continue to keep overnight for more work with therapy  Contact information:   Janine Ores, PA-C Weekdays 8-5  After hours and holidays please check [email or web address] for group call  information for [facility]  Armida Sans 10/02/2022, 9:23 AM

## 2022-10-02 NOTE — Plan of Care (Signed)

## 2022-10-03 MED ORDER — HYDRALAZINE HCL 10 MG PO TABS: 10.0000 mg | ORAL_TABLET | Freq: Four times a day (QID) | ORAL | Status: DC | PRN
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
## 2022-10-03 NOTE — Progress Notes (Signed)
Orthopaedic Trauma Progress Note  SUBJECTIVE: Doing okay this morning.  Notes pain in left ankle but this has been fairly well-controlled with the medications.  Denies any significant numbness or tingling about the left lower extremity.  No chest pain. No SOB. No nausea/vomiting. No other complaints.  Feels she is making progress with therapies.  Notes some stiffness in the right ankle and foot when standing.  This is improving.  Imaging of the right knee and right ankle were performed last week.  Imaging of the ankle negative.  Patient found to have a right proximal fibula fracture.    OBJECTIVE:  Vitals:   10/03/22 0424 10/03/22 0740  BP: (!) 164/104 (!) 144/88  Pulse: 85 84  Resp: 16   Temp: 98.2 F (36.8 C)   SpO2: 99% 99%    General: Sitting up in bed on the phone, no acute distress Respiratory: No increased work of breathing.  Left lower extremity: Well-padded, well-fitting splint in place.  Nontender about the knee.  Endorses sensation to light touch of the toes.  Able to wiggle toes a small amount.  Toes warm well-perfused.  Brisk cap refill. Right lower extremity: Tenderness to palpation of the proximal fibula.  Full range of motion of the knee without pain.  Able to dorsiflex and plantarflex ankle without pain.  Sensation intact distally.  Foot warm well-perfused. + DP pulse  IMAGING: Stable postop imaging.  LABS:  No results found for this or any previous visit (from the past 24 hour(s)).   ASSESSMENT: Judy Wood is a 27 y.o. female, 3 Days Post-Op s/p OPEN REDUCTION INTERNAL FIXATION LEFT TALUS FRACTURE REMOVAL EXTERNAL FIXATION LEFT LEG NON-OP MANAGEMENT RIGHT PROXIMAL FIBULA FRACTURE  CV/Blood loss: Acute blood loss anemia, Hgb 9.2 on 10/02/22.    PLAN: Weightbearing: NWB LLE.  WBAT RLE ROM: Okay for knee motion Incisional and dressing care: Reinforce dressings as needed  Showering: Okay to shower, keep splint dry Orthopedic device(s): Splint LLE Pain  management:  1. Tylenol 1000 mg q 6 hours scheduled 2. Robaxin 500 mg q 6 hours PRN 3. Oxycodone 5-15 mg q 4 hours PRN 4. Dilaudid 0.5-1 mg q 4 hours PRN 5.  Toradol 15 mg every 6 hours x 4 doses VTE prophylaxis: Lovenox, SCDs ID: [id number].  Completed Foley/Lines:  No foley, KVO IVFs Impediments to Fracture Healing: Vitamin D level 21, started on supplementation Dispo: PT/OT evaluation ongoing, may be a candidate for CIR.  Consult has been placed.  If patient going home, will need to be more mobile and able to get bedside commode prior to discharge.  D/C recommendations: -Oxycodone and Robaxin for pain control -Aspirin 325 mg daily x 30 days for DVT prophylaxis -Continue 2000 units daily Vit D supplementation  Follow - up plan: 2 weeks after discharge for wound check and repeat x-rays   Contact information:  Truitt Merle MD, Thyra Breed PA-C. After hours and holidays please check [email or web address] for group call information for [facility]   Thompson Caul, PA-C [phone] (office) [email or web address]

## 2022-10-03 NOTE — Progress Notes (Signed)
Inpatient Rehab Coordinator Note:  I met with patient at bedside to discuss CIR recommendations and goals/expectations of CIR stay.  We reviewed 3 hrs/day of therapy, physician follow up, and average length of stay 2 weeks (dependent upon progress) with goals of mod I.  She lives with multiple family members, and reports her grandmother is there all the time. Grandmother could provide some assistance/supervision but not much physical assist. Left message for patient's mother, Meriam Sprague to confirm. Will await discharge dispo prior to sending to insurance. Continue to follow.   Rehab Admissons Coordinator [place], [place], [place] [phone]

## 2022-10-04 NOTE — Progress Notes (Addendum)
Orthopaedic Trauma Progress Note  SUBJECTIVE: Sleeping this morning. Pain remains well-controlled on current medications.  Making good progress with therapies.  Patient medically stable for discharge.  Appears to be a good CIR candidate.  Grandmother will be able to provide supervision but not much physical assistance.  Rehab admission coordinator has reached out to patient's mom, awaiting callback.  OBJECTIVE:  Vitals:   10/04/22 0500 10/04/22 0737  BP: 119/84 (!) 129/94  Pulse: 71 82  Resp: 18 17  Temp: 98 F (36.7 C) 98.3 F (36.8 C)  SpO2: 98% 100%    General: Sleeping, no acute distress Respiratory: No increased work of breathing.  Left lower extremity: Well-padded, well-fitting splint in place. Toes warm well-perfused.  Brisk cap refill.  IMAGING: Stable postop imaging.  LABS:  No results found for this or any previous visit (from the past 24 hour(s)).   ASSESSMENT: Judy Wood is a 27 y.o. female, 4 Days Post-Op s/p OPEN REDUCTION INTERNAL FIXATION LEFT TALUS FRACTURE REMOVAL EXTERNAL FIXATION LEFT LEG NON-OP MANAGEMENT RIGHT PROXIMAL FIBULA FRACTURE  CV/Blood loss: Acute blood loss anemia, Hgb 9.2 on 10/02/22.    PLAN: Weightbearing: NWB LLE.  WBAT RLE ROM: Okay for L knee ROM as tolerated.  Unrestricted ROM RLE Incisional and dressing care: Dressings left intact until follow-up Showering: Okay to shower, keep splint dry Orthopedic device(s): Splint LLE Pain management:  1. Tylenol 1000 mg q 6 hours scheduled 2. Robaxin 500 mg q 6 hours PRN 3. Oxycodone 5-15 mg q 4 hours PRN 4. Dilaudid 0.5-1 mg q 4 hours PRN VTE prophylaxis: Lovenox, SCDs ID: [email or web address] post op completed Foley/Lines:  No foley, KVO IVFs Impediments to Fracture Healing: Vitamin D level 21, started on supplementation Dispo: PT/OT evaluation ongoing, appears to be a good CIR candidate.  Admission coordinator awaiting callback from patient's mom to confirm dispo prior to sending for insurance  authorization  D/C recommendations: -Oxycodone and Robaxin for pain control -Aspirin 325 mg daily x 30 days for DVT prophylaxis -Continue 2000 units daily Vit D supplementation  Follow - up plan: 2 weeks after discharge for wound check and repeat x-rays   Contact information:  Truitt Merle MD, Thyra Breed PA-C. After hours and holidays please check [email or web address] for group call information for [facility]   Thompson Caul, PA-C [phone] (office) [email or web address]
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
## 2022-10-04 NOTE — Plan of Care (Signed)
  Problem: Education: Goal: Knowledge of General Education information will improve Description: Including pain rating scale, medication(s)/side effects and non-pharmacologic comfort measures Outcome: Progressing   Problem: Clinical Measurements: Goal: Ability to maintain clinical measurements within normal limits will improve Outcome: Progressing Goal: Will remain free from infection Outcome: Progressing Goal: Diagnostic test results will improve Outcome: Progressing Goal: Respiratory complications will improve Outcome: Completed/Met Goal: Cardiovascular complication will be avoided Outcome: Progressing   Problem: Activity: Goal: Risk for activity intolerance will decrease Outcome: Progressing   Problem: Nutrition: Goal: Adequate nutrition will be maintained Outcome: Completed/Met   Problem: Coping: Goal: Level of anxiety will decrease Outcome: Progressing   Problem: Elimination: Goal: Will not experience complications related to bowel motility Outcome: Progressing Goal: Will not experience complications related to urinary retention Outcome: Progressing   Problem: Pain Managment: Goal: General experience of comfort will improve Outcome: Progressing   Problem: Safety: Goal: Ability to remain free from injury will improve Outcome: Progressing   Problem: Skin Integrity: Goal: Risk for impaired skin integrity will decrease Outcome: Progressing
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
## 2022-10-05 ENCOUNTER — Encounter (HOSPITAL_COMMUNITY): Payer: Self-pay | Admitting: Student

## 2022-10-05 NOTE — Plan of Care (Signed)
  Problem: Education: Goal: Knowledge of General Education information will improve Description: Including pain rating scale, medication(s)/side effects and non-pharmacologic comfort measures Outcome: Progressing   Problem: Health Behavior/Discharge Planning: Goal: Ability to manage health-related needs will improve Outcome: Progressing   Problem: Clinical Measurements: Goal: Ability to maintain clinical measurements within normal limits will improve Outcome: Progressing Goal: Will remain free from infection Outcome: Progressing Goal: Diagnostic test results will improve Outcome: Progressing Goal: Cardiovascular complication will be avoided Outcome: Progressing   Problem: Activity: Goal: Risk for activity intolerance will decrease Outcome: Progressing   Problem: Coping: Goal: Level of anxiety will decrease Outcome: Progressing   Problem: Elimination: Goal: Will not experience complications related to bowel motility Outcome: Progressing Goal: Will not experience complications related to urinary retention Outcome: Progressing   Problem: Pain Managment: Goal: General experience of comfort will improve Outcome: Progressing   Problem: Safety: Goal: Ability to remain free from injury will improve Outcome: Progressing   Problem: Skin Integrity: Goal: Risk for impaired skin integrity will decrease Outcome: Progressing   
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
## 2022-10-05 NOTE — Progress Notes (Signed)
Orthopaedic Trauma Progress Note  SUBJECTIVE: Doing okay today.  Has felt nauseous for the past few days but denies any vomiting.  Has been able to tolerate a normal diet and fluids.  Received Zofran yesterday which helped some.  Patient is hopeful to be excepted for CIR.  Is also asking for counseling and resources.  Patient's friend [place] at bedside.  CIR admission coordinator met with patient this morning, awaiting insurance authorization.   OBJECTIVE:  Vitals:   10/05/22 0413 10/05/22 0736  BP: 139/86 (!) 140/91  Pulse: 82 81  Resp: 18 17  Temp: 97.7 F (36.5 C) 98.4 F (36.9 C)  SpO2: 98% 99%    General: Resting in bed, no acute distress Respiratory: No increased work of breathing.  Left lower extremity: Well-padded, well-fitting splint in place. Toes warm well-perfused.  Brisk cap refill.  Endorses sensation to light touch over the toes.  Remainder motor and sensory exam limited due to splint placement.  Nontender about the knee.  Able to wiggle toes.  IMAGING: Stable postop imaging.  LABS:  Results for orders placed or performed during the hospital encounter of 09/28/22 (from the past 24 hour(s))  Creatinine, serum     Status: None   Collection Time: 10/05/22  6:11 AM  Result Value Ref Range   Creatinine, Ser 0.63 0.44 - 1.00 mg/dL   GFR, Estimated >16 >10 mL/min     ASSESSMENT: Judy Wood is a 27 y.o. female, 5 Days Post-Op s/p OPEN REDUCTION INTERNAL FIXATION LEFT TALUS FRACTURE REMOVAL EXTERNAL FIXATION LEFT LEG NON-OP MANAGEMENT RIGHT PROXIMAL FIBULA FRACTURE  CV/Blood loss: Acute blood loss anemia, Hgb 9.2 on 10/02/22.    PLAN: Weightbearing: NWB LLE.  WBAT RLE ROM: Okay for L knee ROM as tolerated.  Unrestricted ROM RLE Incisional and dressing care: Dressings left intact until follow-up Showering: Okay to shower, keep splint dry Orthopedic device(s): Splint LLE Pain management:  1. Tylenol 1000 mg q 6 hours scheduled 2. Robaxin 500 mg q 6 hours  PRN 3. Oxycodone 5-15 mg q 4 hours PRN 4. Dilaudid 0.5-1 mg q 4 hours PRN VTE prophylaxis: Lovenox, SCDs ID: [id number] post op completed Foley/Lines:  No foley, KVO IVFs Impediments to Fracture Healing: Vitamin D level 21, started on supplementation Dispo: PT/OT evaluation ongoing, awaiting insurance authorization for CIR.  Patient's mom will be able to assist at discharge.  Will recheck labs tomorrow morning.  D/C recommendations: -Oxycodone and Robaxin for pain control -Aspirin 325 mg daily x 30 days for DVT prophylaxis -Continue 2000 units daily Vit D supplementation  Follow - up plan: 2 weeks after discharge for wound check and repeat x-rays   Contact information:  Truitt Merle MD, Thyra Breed PA-C. After hours and holidays please check [email or web address] for group call information for [facility]   Thompson Caul, PA-C [phone] (office) [email or web address]
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
## 2022-10-05 NOTE — Plan of Care (Signed)
  Problem: Health Behavior/Discharge Planning: Goal: Ability to manage health-related needs will improve Outcome: Progressing   Problem: Clinical Measurements: Goal: Will remain free from infection Outcome: Progressing Goal: Cardiovascular complication will be avoided Outcome: Progressing   Problem: Activity: Goal: Risk for activity intolerance will decrease Outcome: Progressing   
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
## 2022-10-05 NOTE — Progress Notes (Signed)
Inpatient Rehabilitation Admissions Coordinator   I have received insurance approval for CIR admit. I await bed availability to admit over next couple of days.  Ottie Glazier, RN, MSN Rehab Admissions Coordinator [phone] 10/05/2022 4:19 PM

## 2022-10-06 ENCOUNTER — Other Ambulatory Visit: Payer: Self-pay

## 2022-10-06 ENCOUNTER — Encounter (HOSPITAL_COMMUNITY): Payer: Self-pay | Admitting: Physical Medicine and Rehabilitation

## 2022-10-06 ENCOUNTER — Inpatient Hospital Stay (HOSPITAL_COMMUNITY)
Admission: RE | Admit: 2022-10-06 | Discharge: 2022-10-13 | DRG: 560 | Disposition: A | Discharge status: Home / Self Care | Payer: Medicaid Other | Source: Intra-hospital | Attending: Physical Medicine and Rehabilitation | Admitting: Physical Medicine and Rehabilitation

## 2022-10-06 DIAGNOSIS — Z882 Allergy status to sulfonamides status: Secondary | ICD-10-CM

## 2022-10-06 DIAGNOSIS — E739 Lactose intolerance, unspecified: Secondary | ICD-10-CM | POA: Diagnosis present

## 2022-10-06 DIAGNOSIS — S82892D Other fracture of left lower leg, subsequent encounter for closed fracture with routine healing: Secondary | ICD-10-CM

## 2022-10-06 DIAGNOSIS — S93315A Dislocation of tarsal joint of left foot, initial encounter: Secondary | ICD-10-CM

## 2022-10-06 DIAGNOSIS — F319 Bipolar disorder, unspecified: Secondary | ICD-10-CM | POA: Diagnosis present

## 2022-10-06 DIAGNOSIS — S92112B Displaced fracture of neck of left talus, initial encounter for open fracture: Secondary | ICD-10-CM | POA: Diagnosis not present

## 2022-10-06 DIAGNOSIS — Z818 Family history of other mental and behavioral disorders: Secondary | ICD-10-CM | POA: Diagnosis not present

## 2022-10-06 DIAGNOSIS — K5900 Constipation, unspecified: Secondary | ICD-10-CM | POA: Diagnosis present

## 2022-10-06 DIAGNOSIS — R5381 Other malaise: Secondary | ICD-10-CM | POA: Diagnosis present

## 2022-10-06 DIAGNOSIS — S92102D Unspecified fracture of left talus, subsequent encounter for fracture with routine healing: Secondary | ICD-10-CM | POA: Diagnosis not present

## 2022-10-06 DIAGNOSIS — Z6841 Body Mass Index (BMI) 40.0 and over, adult: Secondary | ICD-10-CM | POA: Diagnosis not present

## 2022-10-06 DIAGNOSIS — F17210 Nicotine dependence, cigarettes, uncomplicated: Secondary | ICD-10-CM | POA: Diagnosis present

## 2022-10-06 DIAGNOSIS — S82831D Other fracture of upper and lower end of right fibula, subsequent encounter for closed fracture with routine healing: Secondary | ICD-10-CM | POA: Diagnosis not present

## 2022-10-06 DIAGNOSIS — F4310 Post-traumatic stress disorder, unspecified: Secondary | ICD-10-CM | POA: Diagnosis present

## 2022-10-06 DIAGNOSIS — S82892E Other fracture of left lower leg, subsequent encounter for open fracture type I or II with routine healing: Principal | ICD-10-CM

## 2022-10-06 DIAGNOSIS — S91002A Unspecified open wound, left ankle, initial encounter: Secondary | ICD-10-CM | POA: Insufficient documentation

## 2022-10-06 DIAGNOSIS — S82892S Other fracture of left lower leg, sequela: Secondary | ICD-10-CM

## 2022-10-06 DIAGNOSIS — Z8249 Family history of ischemic heart disease and other diseases of the circulatory system: Secondary | ICD-10-CM

## 2022-10-06 DIAGNOSIS — I10 Essential (primary) hypertension: Secondary | ICD-10-CM | POA: Diagnosis present

## 2022-10-06 DIAGNOSIS — F4311 Post-traumatic stress disorder, acute: Secondary | ICD-10-CM | POA: Diagnosis present

## 2022-10-06 DIAGNOSIS — J45909 Unspecified asthma, uncomplicated: Secondary | ICD-10-CM | POA: Diagnosis present

## 2022-10-06 DIAGNOSIS — S82831A Other fracture of upper and lower end of right fibula, initial encounter for closed fracture: Secondary | ICD-10-CM | POA: Insufficient documentation

## 2022-10-06 DIAGNOSIS — Z833 Family history of diabetes mellitus: Secondary | ICD-10-CM | POA: Diagnosis not present

## 2022-10-06 DIAGNOSIS — Z79899 Other long term (current) drug therapy: Secondary | ICD-10-CM

## 2022-10-06 DIAGNOSIS — S82892A Other fracture of left lower leg, initial encounter for closed fracture: Secondary | ICD-10-CM | POA: Diagnosis not present

## 2022-10-06 DIAGNOSIS — Z91018 Allergy to other foods: Secondary | ICD-10-CM

## 2022-10-06 DIAGNOSIS — Z560 Unemployment, unspecified: Secondary | ICD-10-CM

## 2022-10-06 HISTORY — DX: Unspecified open wound, left ankle, initial encounter: S91.002A

## 2022-10-06 HISTORY — DX: Dislocation of tarsal joint of left foot, initial encounter: S93.315A

## 2022-10-06 HISTORY — DX: Other fracture of upper and lower end of right fibula, initial encounter for closed fracture: S82.831A

## 2022-10-06 LAB — HEMOGLOBIN A1C
Hgb A1c MFr Bld: 5.5 % (ref 4.8–5.6)
Mean Plasma Glucose: 111.15 mg/dL

## 2022-10-06 MED ORDER — SORBITOL 70 % SOLN: 30.0000 mL | Freq: Every day | Status: DC | PRN

## 2022-10-06 MED ORDER — GABAPENTIN 300 MG PO CAPS
300.0000 mg | ORAL_CAPSULE | Freq: Two times a day (BID) | ORAL | Status: DC
  Administered 2022-10-06 – 2022-10-13 (×14): 300 mg via ORAL
  Filled 2022-10-06 (×14): qty 1

## 2022-10-06 MED ORDER — ONDANSETRON HCL 4 MG PO TABS: 4.0000 mg | ORAL_TABLET | Freq: Four times a day (QID) | ORAL | Status: DC | PRN

## 2022-10-06 MED ORDER — FLEET ENEMA 7-19 GM/118ML RE ENEM: 1.0000 | ENEMA | Freq: Once | RECTAL | Status: DC | PRN

## 2022-10-06 MED ORDER — TRAZODONE HCL 50 MG PO TABS
50.0000 mg | ORAL_TABLET | Freq: Every evening | ORAL | Status: DC | PRN
  Administered 2022-10-08 – 2022-10-12 (×2): 50 mg via ORAL
  Filled 2022-10-06 (×2): qty 1

## 2022-10-06 MED ORDER — DIPHENHYDRAMINE HCL 25 MG PO CAPS: 25.0000 mg | ORAL_CAPSULE | Freq: Four times a day (QID) | ORAL | Status: DC | PRN

## 2022-10-06 MED ORDER — METHOCARBAMOL 500 MG PO TABS
500.0000 mg | ORAL_TABLET | Freq: Four times a day (QID) | ORAL | Status: DC | PRN
  Administered 2022-10-07 – 2022-10-12 (×10): 500 mg via ORAL
  Filled 2022-10-06 (×10): qty 1

## 2022-10-06 MED ORDER — ALUM & MAG HYDROXIDE-SIMETH 200-200-20 MG/5ML PO SUSP: 30.0000 mL | ORAL | Status: DC | PRN

## 2022-10-06 MED ORDER — AMLODIPINE BESYLATE 10 MG PO TABS
10.0000 mg | ORAL_TABLET | Freq: Every day | ORAL | Status: DC
  Administered 2022-10-07 – 2022-10-13 (×7): 10 mg via ORAL
  Filled 2022-10-06 (×7): qty 1

## 2022-10-06 MED ORDER — ENOXAPARIN SODIUM 40 MG/0.4ML IJ SOSY: 40.0000 mg | PREFILLED_SYRINGE | INTRAMUSCULAR | Status: DC

## 2022-10-06 MED ORDER — DOCUSATE SODIUM 100 MG PO CAPS
100.0000 mg | ORAL_CAPSULE | Freq: Two times a day (BID) | ORAL | Status: DC
  Administered 2022-10-06 – 2022-10-13 (×12): 100 mg via ORAL
  Filled 2022-10-06 (×14): qty 1

## 2022-10-06 MED ORDER — GUAIFENESIN-DM 100-10 MG/5ML PO SYRP: 10.0000 mL | ORAL_SOLUTION | Freq: Four times a day (QID) | ORAL | Status: DC | PRN

## 2022-10-06 MED ORDER — ACETAMINOPHEN 500 MG PO TABS
1000.0000 mg | ORAL_TABLET | Freq: Four times a day (QID) | ORAL | Status: DC
  Administered 2022-10-06 – 2022-10-11 (×12): 1000 mg via ORAL
  Filled 2022-10-06 (×18): qty 2

## 2022-10-06 MED ORDER — ONDANSETRON HCL 4 MG/2ML IJ SOLN: 4.0000 mg | Freq: Four times a day (QID) | INTRAMUSCULAR | Status: DC | PRN

## 2022-10-06 MED ORDER — VITAMIN D 25 MCG (1000 UNIT) PO TABS
2000.0000 [IU] | ORAL_TABLET | Freq: Every day | ORAL | Status: DC
  Administered 2022-10-07 – 2022-10-13 (×7): 2000 [IU] via ORAL
  Filled 2022-10-06 (×8): qty 2

## 2022-10-06 MED ORDER — POLYETHYLENE GLYCOL 3350 17 G PO PACK: 17.0000 g | PACK | Freq: Every day | ORAL | Status: DC | PRN

## 2022-10-06 MED ORDER — PRAZOSIN HCL 2 MG PO CAPS
2.0000 mg | ORAL_CAPSULE | Freq: Every day | ORAL | Status: DC
  Administered 2022-10-06 – 2022-10-11 (×6): 2 mg via ORAL
  Filled 2022-10-06 (×6): qty 1

## 2022-10-06 MED ORDER — ENOXAPARIN SODIUM 40 MG/0.4ML IJ SOSY
40.0000 mg | PREFILLED_SYRINGE | INTRAMUSCULAR | Status: DC
  Administered 2022-10-07 – 2022-10-13 (×7): 40 mg via SUBCUTANEOUS
  Filled 2022-10-06 (×7): qty 0.4

## 2022-10-06 MED ORDER — OXYCODONE HCL 5 MG PO TABS
5.0000 mg | ORAL_TABLET | ORAL | Status: DC | PRN
  Administered 2022-10-06 – 2022-10-08 (×5): 10 mg via ORAL
  Filled 2022-10-06 (×5): qty 2

## 2022-10-06 MED ORDER — OXYCODONE HCL 5 MG PO TABS: 10.0000 mg | ORAL_TABLET | ORAL | Status: DC | PRN

## 2022-10-06 MED ORDER — OXYCODONE HCL 5 MG PO TABS: 5.0000 mg | ORAL_TABLET | ORAL | Status: DC | PRN
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
## 2022-10-06 NOTE — Progress Notes (Signed)
Inpatient Rehabilitation Admission Medication Review by a Pharmacist  A complete drug regimen review was completed for this patient to identify any potential clinically significant medication issues.  High Risk Drug Classes Is patient taking? Indication by Medication  Antipsychotic No   Anticoagulant Yes Lovenox - DVT px  Antibiotic No   Opioid Yes Oxycodone - pain  Antiplatelet No   Hypoglycemics/insulin No   Vasoactive Medication Yes Norvasc - HTN  Chemotherapy No   Other Yes Vitamin D - supplementation Robaxin - spasm Trazodone - sleep     Type of Medication Issue Identified Description of Issue Recommendation(s)  Drug Interaction(s) (clinically significant)     Duplicate Therapy     Allergy     No Medication Administration End Date     Incorrect Dose     Additional Drug Therapy Needed     Significant med changes from prior encounter (inform family/care partners about these prior to discharge).    Other       Clinically significant medication issues were identified that warrant physician communication and completion of prescribed/recommended actions by midnight of the next day:  No  Name of provider notified for urgent issues identified:   Provider Method of Notification:     Pharmacist comments:   Time spent performing this drug regimen review (minutes):  20   Ashtian Villacis A. Jeanella Craze, PharmD, BCPS, FNKF Clinical Pharmacist  Please utilize Amion for appropriate phone number to reach the unit pharmacist [facility])

## 2022-10-06 NOTE — Progress Notes (Signed)
Orthopaedic Trauma Progress Note  No acute events overnight.  Patient remains medically stable for discharge.  Has received insurance authorization for CIR, awaiting bed availability.  Patient has not required IV pain medication over the last 3 days, we will go ahead and discontinue this today.  Vitals:   10/05/22 2009 10/06/22 0536  BP: (!) 145/83 (!) 149/98  Pulse: 81 87  Resp: 18 18  Temp: 97.7 F (36.5 C) 97.7 F (36.5 C)  SpO2: 99% 97%    IMAGING: Stable postop imaging.  LABS:  Results for orders placed or performed during the hospital encounter of 09/28/22 (from the past 24 hour(s))  CBC     Status: Abnormal   Collection Time: 10/06/22  1:43 AM  Result Value Ref Range   WBC 9.0 4.0 - 10.5 K/uL   RBC 3.70 (L) 3.87 - 5.11 MIL/uL   Hemoglobin 10.2 (L) 12.0 - 15.0 g/dL   HCT 44.0 (L) 34.7 - 42.5 %   MCV 87.3 80.0 - 100.0 fL   MCH 27.6 26.0 - 34.0 pg   MCHC 31.6 30.0 - 36.0 g/dL   RDW 95.6 38.7 - 56.4 %   Platelets 388 150 - 400 K/uL   nRBC 0.0 0.0 - 0.2 %  Basic metabolic panel     Status: Abnormal   Collection Time: 10/06/22  1:43 AM  Result Value Ref Range   Sodium 136 135 - 145 mmol/L   Potassium 4.1 3.5 - 5.1 mmol/L   Chloride 101 98 - 111 mmol/L   CO2 26 22 - 32 mmol/L   Glucose, Bld 115 (H) 70 - 99 mg/dL   BUN 12 6 - 20 mg/dL   Creatinine, Ser 3.32 0.44 - 1.00 mg/dL   Calcium 9.0 8.9 - 95.1 mg/dL   GFR, Estimated >88 >41 mL/min   Anion gap 9 5 - 15  Hemoglobin A1c     Status: None   Collection Time: 10/06/22  1:43 AM  Result Value Ref Range   Hgb A1c MFr Bld 5.5 4.8 - 5.6 %   Mean Plasma Glucose 111.15 mg/dL     ASSESSMENT: Denton Brick Aldredge is a 27 y.o. female, 6 Days Post-Op s/p OPEN REDUCTION INTERNAL FIXATION LEFT TALUS FRACTURE REMOVAL EXTERNAL FIXATION LEFT LEG NON-OP MANAGEMENT RIGHT PROXIMAL FIBULA FRACTURE  CV/Blood loss: Hemoglobin 10.2 this morning, stable  PLAN: Weightbearing: NWB LLE.  WBAT RLE ROM: Okay for L knee ROM as tolerated.   Unrestricted ROM RLE Incisional and dressing care: Dressings left intact until follow-up Showering: Okay to shower, keep splint dry Orthopedic device(s): Splint LLE Pain management: Continue current regimen. VTE prophylaxis: Lovenox, SCDs ID: [id number] post op completed Foley/Lines:  No foley, KVO IVFs Impediments to Fracture Healing: Vitamin D level 21, started on supplementation Dispo: Awaiting bed and CIR.  Plan for repeat x-rays left ankle on 10/14/2022  D/C recommendations: -Oxycodone and Robaxin for pain control -Aspirin 325 mg daily x 30 days for DVT prophylaxis -Continue 2000 units daily Vit D supplementation  Follow - up plan: 2 weeks after discharge for wound check and repeat x-rays   Contact information:  Truitt Merle MD, Thyra Breed PA-C. After hours and holidays please check [email or web address] for group call information for [facility]   Thompson Caul, PA-C [phone] (office) [email or web address]
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
## 2022-10-06 NOTE — Plan of Care (Signed)
  Problem: Education: Goal: Knowledge of General Education information will improve Description: Including pain rating scale, medication(s)/side effects and non-pharmacologic comfort measures Outcome: Progressing   Problem: Health Behavior/Discharge Planning: Goal: Ability to manage health-related needs will improve Outcome: Progressing   Problem: Clinical Measurements: Goal: Ability to maintain clinical measurements within normal limits will improve Outcome: Progressing Goal: Will remain free from infection Outcome: Progressing Goal: Diagnostic test results will improve Outcome: Progressing Goal: Cardiovascular complication will be avoided Outcome: Progressing   Problem: Activity: Goal: Risk for activity intolerance will decrease Outcome: Progressing   Problem: Coping: Goal: Level of anxiety will decrease Outcome: Progressing   Problem: Elimination: Goal: Will not experience complications related to bowel motility Outcome: Progressing Goal: Will not experience complications related to urinary retention Outcome: Progressing   Problem: Pain Managment: Goal: General experience of comfort will improve Outcome: Progressing   Problem: Safety: Goal: Ability to remain free from injury will improve Outcome: Progressing   Problem: Skin Integrity: Goal: Risk for impaired skin integrity will decrease Outcome: Progressing   

## 2022-10-06 NOTE — Progress Notes (Signed)
Allowed visitors are as follows:  Judy Wood, Mother [name] [name]   [name] IS NOT AN APPROVED VISITOR AND SHOULD NOT BE ALLOWED IN THE HOSPITAL.

## 2022-10-06 NOTE — Progress Notes (Signed)
Inpatient Rehabilitation Admissions Coordinator   CIR bed is available to admit her to today. Acute team and TOC made aware. I will make the arrangements.  Ottie Glazier, RN, MSN Rehab Admissions Coordinator [phone] 10/06/2022 10:19 AM
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
## 2022-10-07 DIAGNOSIS — S82892S Other fracture of left lower leg, sequela: Secondary | ICD-10-CM | POA: Diagnosis not present

## 2022-10-07 MED ORDER — SORBITOL 70 % SOLN
30.0000 mL | Freq: Once | Status: AC
  Administered 2022-10-07: 30 mL via ORAL
  Filled 2022-10-07: qty 30
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
## 2022-10-07 NOTE — Plan of Care (Signed)
  Problem: Consults Goal: RH GENERAL PATIENT EDUCATION Description: See Patient Education module for education specifics. Outcome: Progressing   Problem: RH BOWEL ELIMINATION Goal: RH STG MANAGE BOWEL WITH ASSISTANCE Description: STG Manage Bowel with min Assistance. Outcome: Progressing Goal: RH STG MANAGE BOWEL W/MEDICATION W/ASSISTANCE Description: STG Manage Bowel with Medication with min Assistance. Outcome: Progressing   Problem: RH SAFETY Goal: RH STG ADHERE TO SAFETY PRECAUTIONS W/ASSISTANCE/DEVICE Description: STG Adhere to Safety Precautions With cueing Assistance/Device. Outcome: Progressing Goal: RH STG DECREASED RISK OF FALL WITH ASSISTANCE Description: STG Decreased Risk of Fall With cueing Assistance. Outcome: Progressing   Problem: RH PAIN MANAGEMENT Goal: RH STG PAIN MANAGED AT OR BELOW PT'S PAIN GOAL Description: Pain will be managed 4 out of 10 on pain scale with PRN medications min assist  Outcome: Progressing   Problem: RH KNOWLEDGE DEFICIT GENERAL Goal: RH STG INCREASE KNOWLEDGE OF SELF CARE AFTER HOSPITALIZATION Description: Patient/caregiver will be able to manage medications, self care, and diet/lifestyle modifications to improve blood pressure and manage a healthy weight from nursing education, nursing handouts, and other resources independently  Outcome: Progressing

## 2022-10-07 NOTE — Progress Notes (Signed)
[facility]patient Rehabilitation Center Individual Statement of Services  Patient Name:  Judy Wood  Date:  10/07/2022  Welcome to the Inpatient Rehabilitation [facility].  Our goal is to provide you with an individualized program based on your diagnosis and situation, designed to meet your specific needs.  With this comprehensive rehabilitation program, you will be expected to participate in at least 3 hours of rehabilitation therapies Monday-Friday, with modified therapy programming on the weekends.  Your rehabilitation program will include the following services:  Physical Therapy (PT), Occupational Therapy (OT), 24 hour per day rehabilitation nursing, Therapeutic Recreaction (TR), Neuropsychology, Care Coordinator, Rehabilitation Medicine, Nutrition Services, and Pharmacy Services  Weekly team conferences will be held on Tuesday to discuss your progress.  Your Inpatient Rehabilitation Care Coordinator will talk with you frequently to get your input and to update you on team discussions.  Team conferences with you and your family in attendance may also be held.  Expected length of stay: 7-10 days  Overall anticipated outcome: Independent with device  Depending on your progress and recovery, your program may change. Your Inpatient Rehabilitation Care Coordinator will coordinate services and will keep you informed of any changes. Your Inpatient Rehabilitation Care Coordinator's name and contact numbers are listed  below.  The following services may also be recommended but are not provided by the Inpatient Rehabilitation Center:  Driving Evaluations Home Health Rehabiltiation Services Outpatient Rehabilitation Services Vocational Rehabilitation   Arrangements will be made to provide these services after discharge if needed.  Arrangements include referral to agencies that provide these services.  Your insurance has been verified to be:  [employer] Your primary doctor is:   None  Pertinent information will be shared with your doctor and your insurance company.  Inpatient Rehabilitation Care Coordinator:  Dossie Der, [place] [phone] or Luna Glasgow  Information discussed with and copy given to patient by: Lucy Chris, 10/07/2022, 10:53 AM
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
## 2022-10-07 NOTE — Progress Notes (Signed)
Physical Therapy Session Note  Patient Details  Name: Judy Wood MRN: [id number] Date of Birth: 07/28/1995  Today's Date: 10/07/2022 PT Individual Time: 1415-1510 PT Individual Time Calculation (min): 55 min  and Today's Date: 10/07/2022 PT Missed Time: 20 Minutes Missed Time Reason: Patient fatigue;Patient ill (Comment) (loopy after taking medication)  Short Term Goals: Week 1:  PT Short Term Goal 1 (Week 1): =LTGs d/t ELOS  Skilled Therapeutic Interventions/Progress Updates:  pt received in bed and agreeable to therapy. Pt reports no pain at this time d/t recently taking oxy, however pt is loop and reports feeling very sleepy. Deferred gait training until pt is more awake on later date.   Bed mobility with supervision, supine<>sit with use of bed features.  Lateral scoot transfer with CGA, with pt using unconventional method, but able to do so safely. Discussed better technique, and pt able to partially apply later in session, but will benefit from reinforcement.  Sit to stand to RW with min A overall and Stand pivot transfer to mat table with min A for balance and safety.   Pt performed modified stand pivot car transfer at sedan and van height with min A for safety and balance. Pt largely able to perform without assist.   During session, pt had urge to void, so set up with urinal. Set up a with instruction for 3/3 toileting tasks.   Pt returned to room and to bed, was left with all needs in reach and alarm active.   Therapy Documentation Precautions:  Precautions Precautions: Fall Precaution Comments: L ankle cast, LLE NWB, RLE WBAT Required Braces or Orthoses: Splint/Cast Splint/Cast: L ankle Restrictions Weight Bearing Restrictions: Yes RLE Weight Bearing: Weight bearing as tolerated LLE Weight Bearing: Non weight bearing Other Position/Activity Restrictions: okay for knee motion General: PT Amount of Missed Time (min): 20 Minutes PT Missed Treatment Reason: Patient  fatigue;Patient ill (Comment) (loopy after taking medication) Vital Signs: Therapy Vitals Temp: 98.4 F (36.9 C) Temp Source: Oral Pulse Rate: 83 Resp: 16 BP: (!) 137/94 Patient Position (if appropriate): Lying Oxygen Therapy SpO2: 99 % O2 Device: Room Air Pain: Pain Assessment Patients Stated Pain Goal: 0 Pain Intervention(s): RN made aware;Medication (See eMAR);Emotional support;Repositioned Multiple Pain Sites: Yes 2nd Pain Site Pain Score: 6 Pain Type: Acute pain Pain Location: Leg Pain Orientation: Right Pain Descriptors / Indicators: Aching Pain Frequency: Constant Pain Onset: On-going Patient's Stated Pain Goal: 0 Pain Intervention(s): Medication (See eMAR);Emotional support;RN made aware;Repositioned Mobility: Bed Mobility Bed Mobility: Supine to Sit;Sit to Supine Supine to Sit: Supervision/Verbal cueing Sit to Supine: Supervision/Verbal cueing Transfers Transfers: Sit to Stand;Squat Pivot Transfers Sit to Stand: Minimal Assistance - Patient > 75% Squat Pivot Transfers: Set up assist Transfer (Assistive device): Rolling walker Locomotion : Gait Ambulation: No Gait Gait: No Stairs / Additional Locomotion Stairs: No Wheelchair Mobility Wheelchair Mobility: No  Trunk/Postural Assessment : Cervical Assessment Cervical Assessment: Within Functional Limits Thoracic Assessment Thoracic Assessment: Within Functional Limits Lumbar Assessment Lumbar Assessment: Within Functional Limits Postural Control Postural Control: Within Functional Limits  Balance: Balance Balance Assessed: Yes Static Sitting Balance Static Sitting - Balance Support: No upper extremity supported;Feet supported Static Sitting - Level of Assistance: 6: Modified independent (Device/Increase time) Dynamic Sitting Balance Dynamic Sitting - Balance Support: Feet supported;During functional activity;No upper extremity supported Dynamic Sitting - Level of Assistance: 6: Modified independent  (Device/Increase time) Sitting balance - Comments: able to do full latral lean for pericare/ LB dressing Static Standing Balance Static Standing - Balance Support:  Bilateral upper extremity supported;During functional activity Static Standing - Level of Assistance: 4: Min assist Dynamic Standing Balance Dynamic Standing - Balance Support: Bilateral upper extremity supported Dynamic Standing - Level of Assistance: 3: Mod assist Exercises:   Other Treatments:      Therapy/Group: Individual Therapy  Juluis Rainier 10/07/2022, 3:24 PM
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
## 2022-10-07 NOTE — Progress Notes (Signed)
Inpatient Rehabilitation  Patient information reviewed and entered into eRehab system by Melissa M. Bowie, M.A., CCC/SLP, PPS Coordinator.  Information including medical coding, functional ability and quality indicators will be reviewed and updated through discharge.    
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
## 2022-10-07 NOTE — Plan of Care (Signed)
  Problem: Sit to Stand Goal: LTG:  Patient will perform sit to stand in prep for activites of daily living with assistance level (OT) Description: LTG:  Patient will perform sit to stand in prep for activites of daily living with assistance level (OT) Flowsheets (Taken 10/07/2022 1259) LTG: PT will perform sit to stand in prep for activites of daily living with assistance level: Independent with assistive device   Problem: RH Bathing Goal: LTG Patient will bathe all body parts with assist levels (OT) Description: LTG: Patient will bathe all body parts with assist levels (OT) Flowsheets (Taken 10/07/2022 1259) LTG: Pt will perform bathing with assistance level/cueing: Independent with assistive device  LTG: Position pt will perform bathing:  Edge of bed  Shower   Problem: RH Dressing Goal: LTG Patient will perform lower body dressing w/assist (OT) Description: LTG: Patient will perform lower body dressing with assist, with/without cues in positioning using equipment (OT) Flowsheets (Taken 10/07/2022 1259) LTG: Pt will perform lower body dressing with assistance level of: Independent with assistive device   Problem: RH Toileting Goal: LTG Patient will perform toileting task (3/3 steps) with assistance level (OT) Description: LTG: Patient will perform toileting task (3/3 steps) with assistance level (OT)  Flowsheets (Taken 10/07/2022 1259) LTG: Pt will perform toileting task (3/3 steps) with assistance level: Independent with assistive device   Problem: RH Simple Meal Prep Goal: LTG Patient will perform simple meal prep w/assist (OT) Description: LTG: Patient will perform simple meal prep with assistance, with/without cues (OT). Flowsheets (Taken 10/07/2022 1259) LTG: Pt will perform simple meal prep with assistance level of: Independent with assistive device LTG: Pt will perform simple meal prep w/level of: Wheelchair level   Problem: RH Toilet Transfers Goal: LTG Patient will perform toilet  transfers w/assist (OT) Description: LTG: Patient will perform toilet transfers with assist, with/without cues using equipment (OT) Flowsheets (Taken 10/07/2022 1259) LTG: Pt will perform toilet transfers with assistance level of: Independent with assistive device   Problem: RH Tub/Shower Transfers Goal: LTG Patient will perform tub/shower transfers w/assist (OT) Description: LTG: Patient will perform tub/shower transfers with assist, with/without cues using equipment (OT) Flowsheets (Taken 10/07/2022 1259) LTG: Pt will perform tub/shower stall transfers with assistance level of: Set up assist LTG: Pt will perform tub/shower transfers from: Tub/shower combination

## 2022-10-08 DIAGNOSIS — S82892A Other fracture of left lower leg, initial encounter for closed fracture: Secondary | ICD-10-CM

## 2022-10-08 MED ORDER — HYDROCODONE-ACETAMINOPHEN 5-325 MG PO TABS
1.0000 | ORAL_TABLET | ORAL | Status: DC | PRN
  Administered 2022-10-08 – 2022-10-11 (×13): 1 via ORAL
  Filled 2022-10-08 (×13): qty 1

## 2022-10-08 NOTE — Plan of Care (Signed)
  Problem: Consults Goal: RH GENERAL PATIENT EDUCATION Description: See Patient Education module for education specifics. Outcome: Progressing   Problem: RH BOWEL ELIMINATION Goal: RH STG MANAGE BOWEL WITH ASSISTANCE Description: STG Manage Bowel with min Assistance. Outcome: Progressing Goal: RH STG MANAGE BOWEL W/MEDICATION W/ASSISTANCE Description: STG Manage Bowel with Medication with min Assistance. Outcome: Progressing   Problem: RH SAFETY Goal: RH STG ADHERE TO SAFETY PRECAUTIONS W/ASSISTANCE/DEVICE Description: STG Adhere to Safety Precautions With cueing Assistance/Device. Outcome: Progressing Goal: RH STG DECREASED RISK OF FALL WITH ASSISTANCE Description: STG Decreased Risk of Fall With cueing Assistance. Outcome: Progressing   Problem: RH PAIN MANAGEMENT Goal: RH STG PAIN MANAGED AT OR BELOW PT'S PAIN GOAL Description: Pain will be managed 4 out of 10 on pain scale with PRN medications min assist  Outcome: Progressing   Problem: RH KNOWLEDGE DEFICIT GENERAL Goal: RH STG INCREASE KNOWLEDGE OF SELF CARE AFTER HOSPITALIZATION Description: Patient/caregiver will be able to manage medications, self care, and diet/lifestyle modifications to improve blood pressure and manage a healthy weight from nursing education, nursing handouts, and other resources independently  Outcome: Progressing
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
## 2022-10-08 NOTE — Progress Notes (Signed)
Physical Therapy Session Note  Patient Details  Name: AZALYN MOLE MRN: [id number] Date of Birth: June 01, 1995  Today's Date: 10/08/2022 PT Individual Time: 1415-1500 PT Individual Time Calculation (min): 45 min   Short Term Goals: Week 1:  PT Short Term Goal 1 (Week 1): =LTGs d/t ELOS  Skilled Therapeutic Interventions/Progress Updates:    pt received in bed and agreeable to therapy. Pt reports unrated BLE pain, nsg made aware, rest and positioning as needed.   Bed mobility with supervision. Min A for scoot transfer to w/c for safety.   Pt propelled w/c with BUE  throughout with supervision for strength and endurance.   Session focused on transfer training for safety with squat pivot with focus on head hips relation ship, progressing from min A to CGA with repetition.   Also showed pt images of her ankle and discussed relevant anatomy. Pt  remained in w/c at end of session and was left with all needs in reach and alarm active.   Therapy Documentation Precautions:  Precautions Precautions: Fall Precaution Comments: L ankle cast, LLE NWB, RLE WBAT Required Braces or Orthoses: Splint/Cast Splint/Cast: L ankle Restrictions Weight Bearing Restrictions: Yes RLE Weight Bearing: Weight bearing as tolerated LLE Weight Bearing: Non weight bearing Other Position/Activity Restrictions: okay for knee motion General:       Therapy/Group: Individual Therapy  Juluis Rainier 10/08/2022, 4:15 PM

## 2022-10-08 NOTE — Progress Notes (Signed)
Physical Therapy Session Note  Patient Details  Name: Judy Wood MRN: [id number] Date of Birth: [birth year]  Today's Date: 10/08/2022 PT Individual Time: 0805-0847 PT Individual Time Calculation (min): 42 min  and Today's Date: 10/08/2022 PT Missed Time: 33 Minutes Missed Time Reason: Patient ill (Comment) (Nausea, lightheadedness)  Short Term Goals: Week 1:  PT Short Term Goal 1 (Week 1): =LTGs d/t ELOS  Skilled Therapeutic Interventions/Progress Updates:      Therapy Documentation Precautions:  Precautions Precautions: Fall Precaution Comments: L ankle cast, LLE NWB, RLE WBAT Required Braces or Orthoses: Splint/Cast Splint/Cast: L ankle Restrictions Weight Bearing Restrictions: Yes RLE Weight Bearing: Weight bearing as tolerated LLE Weight Bearing: Non weight bearing Other Position/Activity Restrictions: okay for knee motion  Pt received semi-reclined in bed, agreeable to PT. Pt reports she is enjoying her time at rehab and feels like she is able to be more active. Pt reports she received all her medication prior to session and requires (S) with bed mobility to edge of bed. Pt utilized L LE to push from bed and PT educated on NWB precautions and pt states she tends to forget. PT explained anatomical location of injuries and the importance of adhering to precautions and pt receptive and reports now she understands why they are important to follow. Pt reports need to toilet and CGA/supervision with lateral scooting without slide board (pt preference) to drop arm BSC. Pt continent of bowel and MD and nurse notified. Pt reports increased nausea and feeling lightheadedness and attributes symptom onset to pain medication. MD arrived during situation and pt explained symptoms and MD consulted with patient and adjusted pain medication. Pt returned to bed with CGA/min A from drop arm bed side commode with scooting technique and pt dependent for peri-care. Pt left semi-reclined in bed with all  needs in reach and alarm on. Pt missed skilled PT due to feeling ill, plan to make up missed minutes as able.      Therapy/Group: Individual Therapy  Truitt Leep Truitt Leep PT, DPT  10/08/2022, 9:06 AM

## 2022-10-08 NOTE — Progress Notes (Signed)
Occupational Therapy Session Note  Patient Details  Name: Judy Wood MRN: [id number] Date of Birth: 31-Jul-1995  Today's Date: 10/08/2022 OT Individual Time: 1000-1115 OT Individual Time Calculation (min): 75 min    Short Term Goals: Week 1:  OT Short Term Goal 1 (Week 1): Patient will transfer to bedside commode with set up assist OT Short Term Goal 2 (Week 1): Patient will shower with mod assist  Skilled Therapeutic Interventions/Progress Updates:    Patient seen this day for skilled OT.  Patient indicated that she wasn't feeling well secondary to medication and she would prefer to decline treatment.  Nursing was informed and we were able to get the pt to agree with completing BADL related task at EOB.  The pt was able to come from supine to EOB with SBA. The pt went on to remove her over head gown with SBA, she was able to bathing her UB with s/u assist and  her LB by leaning and incorporating the grab bars for additional balance at CGA.  The pt was able to donn her over head gown with s/u assist, she went on to transfer from EOB to w/c LOF using the sliding board at [facility]. The pt  completed UB exercise using a 3lb dumb bell for bicep curl, shld flexion, horizontal abduction and elbow extension 2 sets of 10 with rest breaks as needed. The pt required 1 rest breaks with each exercise. At the end of treatment, the pt remained at w/c LOF with BLE supported.  The call light and bedside table were placed within reach and  all additional needs were addressed prior to exiting the room.    Therapy Documentation Precautions:  Precautions Precautions: Fall Precaution Comments: L ankle cast, LLE NWB, RLE WBAT Required Braces or Orthoses: Splint/Cast Splint/Cast: L ankle Restrictions Weight Bearing Restrictions: Yes RLE Weight Bearing: Weight bearing as tolerated LLE Weight Bearing: Non weight bearing Other Position/Activity Restrictions: okay for knee motion  Therapy/Group: Individual  Therapy  Lavona Mound 10/08/2022, 3:24 PM
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
## 2022-10-09 NOTE — IPOC Note (Signed)
Overall Plan of Care [facility]) Patient Details Name: Judy Wood MRN: [id number] DOB: 1995/10/16  Admitting Diagnosis: Open left ankle fracture, sequela  Hospital Problems: Principal Problem:   Open left ankle fracture, sequela Active Problems:   Post traumatic stress disorder (PTSD)     Functional Problem List: Nursing Bowel, Safety, Edema, Medication Management, Pain  PT Skin Integrity, Pain, Edema, Endurance, Safety, Sensory  OT Balance, Pain, Edema, Safety, Skin Integrity, Sensory  SLP    TR         Basic ADL's: OT Bathing, Dressing, Toileting     Advanced  ADL's: OT Simple Meal Preparation     Transfers: PT Bed Mobility, Bed to Chair, Customer service manager, Tub/Shower     Locomotion: PT Ambulation, Psychologist, prison and probation services, Stairs     Additional Impairments: OT None  SLP        TR      Anticipated Outcomes Item Anticipated Outcome  Self Feeding Independent  Swallowing      Basic self-care  Mod I  Toileting  Mod I   Bathroom Transfers Mod I  Bowel/Bladder  continent B/B  Transfers  mod I lateral scoot/squat pivot, CGA SPT  Locomotion  min A short distance gait  Communication     Cognition     Pain  less than 4  Safety/Judgment  no falls   Therapy Plan: PT Intensity: Minimum of 1-2 x/day ,45 to 90 minutes PT Frequency: 5 out of 7 days PT Duration Estimated Length of Stay: 7-10 days OT Intensity: Minimum of 1-2 x/day, 45 to 90 minutes OT Frequency: 5 out of 7 days OT Duration/Estimated Length of Stay: 7 - 10 days     Team Interventions: Nursing Interventions Patient/Family Education, Pain Management, Bowel Management, Medication Management, Discharge Planning, Skin Care/Wound Management, Psychosocial Support  PT interventions Ambulation/gait training, Discharge planning, DME/adaptive equipment instruction, Functional mobility training, Pain management, Psychosocial support, Therapeutic Activities, UE/LE Strength taining/ROM, Stair training,  Therapeutic Exercise, UE/LE Coordination activities, Skin care/wound management, Patient/family education  OT Interventions Balance/vestibular training, Patient/family education, Self Care/advanced ADL retraining, Splinting/orthotics, Therapeutic Exercise, Discharge planning, DME/adaptive equipment instruction, Functional mobility training, Pain management, Therapeutic Activities, UE/LE Strength taining/ROM  SLP Interventions    TR Interventions    SW/CM Interventions Discharge Planning, Psychosocial Support, Patient/Family Education   Barriers to Discharge MD  Medical stability, Home enviroment access/loayout, Wound care, Lack of/limited family support, Weight, and Weight bearing restrictions  Nursing Decreased caregiver support, Home environment access/layout, Wound Care, Weight, Weight bearing restrictions, Medication compliance home with mom 1 level with 8 ste no rail  PT Inaccessible home environment, Home environment access/layout, Wound Care, Weight, Weight bearing restrictions    OT      SLP      SW Weight bearing restrictions, Insurance for SNF coverage, Other (comments)     Team Discharge Planning: Destination: PT-Home ,OT- Home , SLP-  Projected Follow-up: PT-Outpatient PT, OT-  Home health OT, SLP-  Projected Equipment Needs: PT-Wheelchair (measurements), Wheelchair cushion (measurements), Rolling walker with 5" wheels, OT- To be determined, SLP-  Equipment Details: PT- , OT-Anticipate drop arm commode and tub trasnfer bench Patient/family involved in discharge planning: PT- Patient,  OT-Patient, SLP-   MD ELOS: 7-10 days Medical Rehab Prognosis:  Good Assessment: The patient has been admitted for CIR therapies with the diagnosis of L talus fx- NWB and PTSD. The team will be addressing functional mobility, strength, stamina, balance, safety, adaptive techniques and equipment, self-care, bowel and bladder mgt, patient and caregiver  education, PTSD treatment. Goals have been set  at mod I to min A. Anticipated discharge destination is home with mother/kids.        See Team Conference Notes for weekly updates to the plan of care

## 2022-10-10 DIAGNOSIS — S82892S Other fracture of left lower leg, sequela: Secondary | ICD-10-CM | POA: Diagnosis not present

## 2022-10-10 DIAGNOSIS — I10 Essential (primary) hypertension: Secondary | ICD-10-CM | POA: Diagnosis not present

## 2022-10-10 DIAGNOSIS — K5900 Constipation, unspecified: Secondary | ICD-10-CM

## 2022-10-10 DIAGNOSIS — F4310 Post-traumatic stress disorder, unspecified: Secondary | ICD-10-CM | POA: Diagnosis not present

## 2022-10-10 DIAGNOSIS — D62 Acute posthemorrhagic anemia: Secondary | ICD-10-CM
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
## 2022-10-10 NOTE — Plan of Care (Signed)
  Problem: Consults Goal: RH GENERAL PATIENT EDUCATION Description: See Patient Education module for education specifics. Outcome: Progressing   Problem: RH BOWEL ELIMINATION Goal: RH STG MANAGE BOWEL WITH ASSISTANCE Description: STG Manage Bowel with min Assistance. Outcome: Progressing   Problem: RH SAFETY Goal: RH STG ADHERE TO SAFETY PRECAUTIONS W/ASSISTANCE/DEVICE Description: STG Adhere to Safety Precautions With cueing Assistance/Device. Outcome: Progressing   Problem: RH PAIN MANAGEMENT Goal: RH STG PAIN MANAGED AT OR BELOW PT'S PAIN GOAL Description: Pain will be managed 4 out of 10 on pain scale with PRN medications min assist  Outcome: Progressing   Problem: RH KNOWLEDGE DEFICIT GENERAL Goal: RH STG INCREASE KNOWLEDGE OF SELF CARE AFTER HOSPITALIZATION Description: Patient/caregiver will be able to manage medications, self care, and diet/lifestyle modifications to improve blood pressure and manage a healthy weight from nursing education, nursing handouts, and other resources independently  Outcome: Progressing

## 2022-10-10 NOTE — Progress Notes (Addendum)
Occupational Therapy Session Note  Patient Details  Name: Judy Wood MRN: [id number] Date of Birth: 1995-07-26  Today's Date: 10/10/2022 OT Individual Time: 1000-1100 OT Individual Time Calculation (min): 60 min    Short Term Goals: Week 1:  OT Short Term Goal 1 (Week 1): Patient will transfer to bedside commode with set up assist OT Short Term Goal 2 (Week 1): Patient will shower with mod assist  Skilled Therapeutic Interventions/Progress Updates:      Therapy Documentation Precautions:  Precautions Precautions: Fall Precaution Comments: L ankle cast, LLE NWB, RLE WBAT Required Braces or Orthoses: Splint/Cast Splint/Cast: L ankle Restrictions Weight Bearing Restrictions: Yes RLE Weight Bearing: Weight bearing as tolerated LLE Weight Bearing: Non weight bearing Other Position/Activity Restrictions: okay for knee motion General: "I really miss my kids." Pt seated in W/C upon OT arrival, agreeable to OT.  Pain:  no pain reported   ADL: Toilet transfer: SBA with RW stand pivot W/C><toilet, no VC required for WB  Toileting: SBA, doffing romper, able to complete hygiene after urinating  UB dressing: SBA to doff/don tight romper  Grooming: SBA seated at sink for oral hygiene, hand hygiene and applying makeup   Exercises: Pt completed the following exercise circuit in order to improve functional activity, strength and endurance to prepare for ADLs such as bathing. Pt completed with 7# dowel rod. Pt completed the following exercises in seated position with no noted LOB/SOB and 3x10 repetitions on each exercise: -bicep curls -triceps extensions -forward punches -shoulder flexion  Other Treatments: Pt and OT discussed D/C planning and completing tasks at W/C level for increased independence d/t WB status. Pt and OT therapeutically discussed trauma and grief and finding pt own way of managing grief in a healthy way. Pt and OT discussed continuing speaking to specialist once D/C  in order to manage trauma/grief.    Pt supine in bed with bed alarm activated, 2 bed rails up, call light within reach and 4Ps assessed.   Therapy/Group: Individual Therapy  Velia Meyer, OTD, OTR/L 10/10/2022, 12:51 PM
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
## 2022-10-10 NOTE — Progress Notes (Signed)
sPhysical Therapy Session Note  Patient Details  Name: Judy Wood MRN: [id number] Date of Birth: [birth year]  Today's Date: 10/10/2022 PT Individual Time: 1115-1155 + 1445  PT Individual Time Calculation (min): 40 min + 0 mins  Short Term Goals: Week 1:  PT Short Term Goal 1 (Week 1): =LTGs d/t ELOS  Skilled Therapeutic Interventions/Progress Updates:     Session 1: Chart reviewed and pt agreeable to therapy. Pt received seated in bed with 8/10 c/o pain in LLE. Session focused on amb independence to promote safe home access. Pt initiated session with blocked practice of sit to stand with VC for safe + energy saving stanidng techniques using CGA + RW and fading to S + RW. Pt RW also adjusted to promote stability and reduce back pain. Pt then completed blocked practice of amb ranging 10-66ft with CGA + RW and fading to S + RW. Session education emphasized safe hand placement for sitting and standing. At end of session, pt was left seated in bed with alarm engaged, nurse call bell and all needs in reach.  Session 2: Chart reviewed. Pt received in bed sleeping. PT offered therapy session, and pt politely informed PT of feeling high fatigue. Pt displayed difficulty staying awake. PT explained role of PT and educated pt on need to make up time, which pt verbalized understanding. PT again offered session, and pt again politely declined 2/2 fatigue. Pt then difficult to rouse further. At end of session, pt was left in bed with alarm engaged, nurse call bell and all needs in reach.  Therapy Documentation Precautions:  Precautions Precautions: Fall Precaution Comments: L ankle cast, LLE NWB, RLE WBAT Required Braces or Orthoses: Splint/Cast Splint/Cast: L ankle Restrictions Weight Bearing Restrictions: Yes RLE Weight Bearing: Weight bearing as tolerated LLE Weight Bearing: Non weight bearing Other Position/Activity Restrictions: okay for knee motion General: PT Amount of Missed Time (min): 45  Minutes PT Missed Treatment Reason: Patient fatigue     Therapy/Group: Individual Therapy  Dionne Milo, PT, DPT 10/10/2022, 3:15 PM
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
## 2022-10-11 MED ORDER — ACETAMINOPHEN 325 MG PO TABS
325.0000 mg | ORAL_TABLET | Freq: Four times a day (QID) | ORAL | Status: DC | PRN
  Administered 2022-10-12 – 2022-10-13 (×2): 325 mg via ORAL
  Filled 2022-10-11 (×2): qty 1

## 2022-10-11 MED ORDER — HYDROCODONE-ACETAMINOPHEN 7.5-325 MG PO TABS
1.0000 | ORAL_TABLET | ORAL | Status: DC | PRN
  Administered 2022-10-11 – 2022-10-13 (×7): 1 via ORAL
  Filled 2022-10-11 (×8): qty 1

## 2022-10-11 NOTE — Progress Notes (Signed)
Physical Therapy Session Note  Patient Details  Name: Judy Wood MRN: [id number] Date of Birth: 07/04/1995  Today's Date: 10/11/2022 PT Individual Time: 0800-0845 PT Individual Time Calculation (min): 45 min   Short Term Goals: Week 1:  PT Short Term Goal 1 (Week 1): =LTGs d/t ELOS  Skilled Therapeutic Interventions/Progress Updates:    Pt seated up in w/c on arrival. Pt reports pain controlled on medication at this time. MD in/out during session, discussed discharge plan and current status.   Pt propelled w/c throughout session at supervision level. She is able to partially manage leg rests, but requires assist for doffing.   Pt instructed on and performed stair navigation using shower chair method with min A to move stool and CGA for sit/stand using hand rail. Discussed this as viable option if ramp is not built prior to d/c or to use front steps if needed.   Pt participated in 2 x 10 Sit to stand to RW from mat table for RLE strengthening. During rest breaks, discussed pursuing mental health care and that many people need to try more than one provider before finding the best fit.   Pt returned to room and remained in w/c with all needs in reach.   Therapy Documentation Precautions:  Precautions Precautions: Fall Precaution Comments: L ankle cast, LLE NWB, RLE WBAT Required Braces or Orthoses: Splint/Cast Splint/Cast: L ankle Restrictions Weight Bearing Restrictions: Yes RLE Weight Bearing: Weight bearing as tolerated LLE Weight Bearing: Non weight bearing Other Position/Activity Restrictions: okay for knee motion General:       Therapy/Group: Individual Therapy  Juluis Rainier 10/11/2022, 1:01 PM

## 2022-10-11 NOTE — Progress Notes (Signed)
Recreational Therapy Session Note  Patient Details  Name: Judy Wood MRN: [id number] Date of Birth: 07-Jan-1996 Today's Date: 10/11/2022  Pain: no c/o Skilled Therapeutic Interventions/Progress Updates:  Goal:  Pt will identify at least 2 coping strategies to assist in stress management once home.  MET  Session focused on discussing stress management/coping strategies for use during remainder of hospitalization and at home.  Pt provided with examples, but easily identified 3 strategies that were relaxing, self expressive and grounding for her. Pt stated excitement about potential discharge on Thursday and citing her family as a source of encouragement and stress relief as she moves forward with recovery.  Pt also shared that she realizes that she needs additional professional psychological support at discharge and will be looking into that in the future.  Therapy/Group: Individual Therapy   [place] 10/11/2022, 10:16 AM

## 2022-10-11 NOTE — Progress Notes (Signed)
Occupational Therapy Session Note  Patient Details  Name: LEWIS GLERUM MRN: [id number] Date of Birth: 1995-05-24  Today's Date: 10/11/2022 OT Individual Time: 1000-1100 OT Individual Time Calculation (min): 60 min    Short Term Goals: Week 1:  OT Short Term Goal 1 (Week 1): Patient will transfer to bedside commode with set up assist OT Short Term Goal 2 (Week 1): Patient will shower with mod assist  Skilled Therapeutic Interventions/Progress Updates:      Therapy Documentation Precautions:  Precautions Precautions: Fall Precaution Comments: L ankle cast, LLE NWB, RLE WBAT Required Braces or Orthoses: Splint/Cast Splint/Cast: L ankle Restrictions Weight Bearing Restrictions: Yes RLE Weight Bearing: Weight bearing as tolerated LLE Weight Bearing: Non weight bearing Other Position/Activity Restrictions: okay for knee motion General: "I feel so much better!" Pt supine in bed upon OT arrival, agreeable to OT session. Handoff from rec therapist Misty Stanley.  Pain: No pain reported   ADL: Bed mobility: SBA from [facility] raised supine>EOB Grooming: SBA seated in front of sink in W/C wiping face Oral hygiene: SBA seated at sink, able to manipulate containers  UB dressing: SBA seated in W/C donning and doffing overhead shirts LB dressing: SBA doffing pants for shower Footwear: SBA seated in W/C using figure 4 method  Shower transfer:SBA overall with RW all transfers, one instance of slight LOB requiring Min A to correct while hopping backward to approach shower chair d/t slight decline of ground level Bathing: SBA overall for washing/rinsing/drying all body parts, LLE covered, lateral leaning to clean peri area Transfers: SBA overall with RW all transfers  Other Treatments: Education for safe transfer training with good carryover of education  Pt seated in W/C at end of session with W/C alarm donned, call light within reach and 4Ps assessed.   Therapy/Group: Individual Therapy  Velia Meyer, OTD, OTR/L 10/11/2022, 12:59 PM
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
## 2022-10-11 NOTE — Patient Care Conference (Signed)
Inpatient RehabilitationTeam Conference and Plan of Care Update Date: 10/11/2022   Time: 11:52 AM    Patient Name: Judy Wood      Medical Record Number: [id number]  Date of Birth: [birth year] Sex: Female         Room/Bed: 4W16C/4W16C-01 Payor Info: Payor: [place] MEDICAID PREPAID HEALTH PLAN / Plan: [place] MEDICAID [employer] OF  / Product Type: *No Product type* /    Admit Date/Time:  10/06/2022  5:04 PM  Primary Diagnosis:  Open left ankle fracture, sequela  Hospital Problems: Principal Problem:   Open left ankle fracture, sequela Active Problems:   PTSD (post-traumatic stress disorder)    Expected Discharge Date: Expected Discharge Date: 10/13/22  Team Members Present: Physician leading conference: Dr. Genice Rouge Social Worker Present: Dossie Der, LCSW Nurse Present: Vedia Pereyra, RN PT Present: Truitt Leep, PT OT Present: Velia Meyer, OT PPS Coordinator present : Fae Pippin, SLP     Current Status/Progress Goal Weekly Team Focus  Bowel/Bladder   pt continent of b/b   Remain continent   Assist with needs qshift and prn    Swallow/Nutrition/ Hydration               ADL's   SBA overall for ADLs and functional transfers with RW, occasional VC for NWB   Mod I   shower level bathing, education on NWB status    Mobility   supervision bed mobility, transfers (squat pivot), w/c mobility >100 ft, CGA gait with RW x 15 ft   Supervision/CGA  stress management and improved stand pivot and ambulatory transfers    Communication                Safety/Cognition/ Behavioral Observations               Pain   Pt c/o LLE pain 9/10, prn and scheduled meds given   <3 pain score   Assess qshift and prn    Skin   Skin intact, LLE uta   Remain intact  Assess qhshift and prn      Discharge Planning:  Going home with Mom where her children are and grandmother to assist also. Will need equipment, follow up counseling and eventual OP once can WB    Team Discussion: Left ankle fracture. Pain managed with PRN medications. Incision to left leg with dressing intact. RLE-WBAT and LLE-NWB. Therapies going well.  Patient on target to meet rehab goals: yes, will be at goal level with discharge date of 10/13/22  *See Care Plan and progress notes for long and short-term goals.   Revisions to Treatment Plan:  Adjusting pain medications.  Monitor labs/VS Teaching Needs: Medications, safety, self care, incision care, transfer training, etc.   Current Barriers to Discharge: Decreased caregiver support, Weight, and Weight bearing restrictions  Possible Resolutions to Barriers: Family education Adhere to healthy diet and lifestyle modifications Adhere to weight bearing restrictions Order recommended DME     Medical Summary Current Status: continent- LBM 8/11- 9/10 pain LLE- having nerve and bone pain of LLE- NWB LLE-  Barriers to Discharge: Behavior/Mood;Complicated Wound;Weight bearing restrictions;Morbid Obesity;Uncontrolled Pain;Self-care education  Barriers to Discharge Comments: limtied by NWB- propelled w/c 151ft- steps done- probable LLE foot drop- but cannot tell in splint- Possible Resolutions to Barriers/Weekly Focus: hypomanic- wants to get rid of confidentiality- since man in custody-  needs w/c and shower chair- 8/15/   Continued Need for Acute Rehabilitation Level of Care: The patient requires daily medical management by a physician  with specialized training in physical medicine and rehabilitation for the following reasons: Direction of a multidisciplinary physical rehabilitation program to maximize functional independence : Yes Medical management of patient stability for increased activity during participation in an intensive rehabilitation regime.: Yes Analysis of laboratory values and/or radiology reports with any subsequent need for medication adjustment and/or medical intervention. : Yes   I attest that I was present,  lead the team conference, and concur with the assessment and plan of the team.   Jearld Adjutant 10/11/2022, 5:11 PM

## 2022-10-11 NOTE — Progress Notes (Signed)
Physical Therapy Session Note  Patient Details  Name: Judy Wood MRN: [id number] Date of Birth: January 18, 1996  Today's Date: 10/11/2022 PT Individual Time: 1433-1540 PT Individual Time Calculation (min): 67 min   Short Term Goals: Week 1:  PT Short Term Goal 1 (Week 1): =LTGs d/t ELOS  Skilled Therapeutic Interventions/Progress Updates:      Therapy Documentation Precautions:  Precautions Precautions: Fall Precaution Comments: L ankle cast, LLE NWB, RLE WBAT Required Braces or Orthoses: Splint/Cast Splint/Cast: L ankle Restrictions Weight Bearing Restrictions: Yes RLE Weight Bearing: Weight bearing as tolerated LLE Weight Bearing: Non weight bearing Other Position/Activity Restrictions: okay for knee motion  Pt received prone in bed as she reports optimal positioning for pain relief. Pt agreeable to PT session with emphasis on transfer training, dynamic standing balance and L LE flexibility training.   Pt (S) with bed mobility and stand pivot with RW to w/c. Pt (S) for w/c propulsion x 150 ft to dayroom and transferred to mat table with above technique.   Pt requires (S) for dynamic standing balance with RW while maintaining L LE NWB status. Pt engaged in contralateral reaching for bean bags and tossing for corn hole board. Pt performed 2 x 2. 30 seconds with improved standing tolerance.   Pt transitioned to left heel slides on scooter board for knee ROM/flexibility training 3 x 12.   Pt propelled w/c to room with (S) and left seated in w/c at bedside with chair alarm on and all needs within reach.    Therapy/Group: Individual Therapy  Truitt Leep Truitt Leep PT, DPT  10/11/2022, 7:53 AM

## 2022-10-11 NOTE — Progress Notes (Signed)
Patient ID: Judy Wood, female   DOB: 02/20/1996, 27 y.o.   MRN: [id number]  Met with pt to give her the team conference update regarding goals of supervision-CGA level and discharge date 8/15. She is wanting to be at home for her son's birthday, which is 8/15. Pt is doing well and feels better than she has. She is happy for all the support she is getting from everyone. She is not confidential anymore per her request and is looking forward to more visitors coming to see her. Aware will need follow up rehab once can WB on leg, will save her visits. Have ordered wheelchair and tub seat via [employer] and she will get rolling walker on own-feels grandmother can get for her. Prepare for discharge Thursday

## 2022-10-11 NOTE — Progress Notes (Signed)
Physical Therapy Session Note  Patient Details  Name: Judy Wood MRN: [id number] Date of Birth: Jan 03, 1996  Today's Date: 10/11/2022 PT Individual Time: 1115-1155 PT Individual Time Calculation (min): 40 min   Short Term Goals: Week 1:  PT Short Term Goal 1 (Week 1): =LTGs d/t ELOS  Skilled Therapeutic Interventions/Progress Updates:    Chart reviewed and pt agreeable to therapy. Pt received seated in WC with c/o pain in LLE that was not quantified. Session focused on wheelchair management and home set up to prepare for safe d/c. Pt initiated session with WC propulsion of 28ft to therapy gym with S. Pt displayed safe use of WC breaks. Pt then participated in blocked practice of WC part management including leg rests and arm rests. Pt displayed good ability to manage WC. Pt then completed 349ft+ WC propulsion with S. Pt returned to room and discussed home set up with PT, confirming ability to use WC in house. Pt then transferred to bed using amb with S + RW. At end of session, pt was left semi-reclined in bed with alarm engaged, nurse call bell and all needs in reach.     Therapy Documentation Precautions:  Precautions Precautions: Fall Precaution Comments: L ankle cast, LLE NWB, RLE WBAT Required Braces or Orthoses: Splint/Cast Splint/Cast: L ankle Restrictions Weight Bearing Restrictions: Yes RLE Weight Bearing: Weight bearing as tolerated LLE Weight Bearing: Non weight bearing Other Position/Activity Restrictions: okay for knee motion General:        Therapy/Group: Individual Therapy  Dionne Milo, PT, DPT 10/11/2022, 2:18 PM

## 2022-10-11 NOTE — Progress Notes (Signed)
Pt continues to self transfer in room independently. Reinforced safety precautions with pt.Alarm was activated and when staff went to pt room, pt was already in bathroom.  Mylo Red, LPN

## 2022-10-12 MED ORDER — PRAZOSIN HCL 2 MG PO CAPS
4.0000 mg | ORAL_CAPSULE | Freq: Every day | ORAL | Status: DC
  Administered 2022-10-12: 4 mg via ORAL
  Filled 2022-10-12 (×2): qty 2

## 2022-10-12 MED ORDER — METHOCARBAMOL 500 MG PO TABS
500.0000 mg | ORAL_TABLET | Freq: Four times a day (QID) | ORAL | Status: DC
  Administered 2022-10-12 – 2022-10-13 (×6): 500 mg via ORAL
  Filled 2022-10-12 (×6): qty 1

## 2022-10-12 NOTE — Progress Notes (Signed)
Physical Therapy Discharge Summary  Patient Details  Name: Judy Wood MRN: [id number] Date of Birth: 08/18/1995  Date of Discharge from PT service:October 12, 2022  Today's Date: 10/12/2022 PT Individual Time: 0630-1601    and Today's Date: 10/12/2022 PT Missed Time:  12 Missed Time Reason:  pain, fatigue    Patient has met 6 of 6 long term goals due to improved activity tolerance, improved balance, improved postural control, increased strength, increased range of motion, decreased pain, ability to compensate for deficits, and improved coordination.  Patient to discharge at a wheelchair level Modified Independent.     Reasons goals not met: N/A   Recommendation:  Patient will benefit from ongoing skilled PT services in outpatient setting to continue to advance safe functional mobility, address ongoing impairments in left foot and ankle strength and ROM, and minimize fall risk.  Equipment: 22 x 18 manual w/c and cushion   Reasons for discharge: treatment goals met and discharge from hospital  Patient/family agrees with progress made and goals achieved: Yes  PT Discharge Pain Interference Pain Interference Pain Effect on Sleep: 3. Frequently Pain Interference with Therapy Activities: 2. Occasionally Pain Interference with Day-to-Day Activities: 2. Occasionally Cognition Overall Cognitive Status: Within Functional Limits for tasks assessed Arousal/Alertness: Awake/alert Orientation Level: Oriented X4 Attention: Selective Selective Attention: Appears intact Memory: Appears intact Awareness: Appears intact Problem Solving: Appears intact Safety/Judgment: Appears intact Sensation Sensation Light Touch: Impaired Detail Central sensation comments: UE in tact Peripheral sensation comments: absent light touch L LE L4, L5, S1 Light Touch Impaired Details: Impaired LLE Hot/Cold: Appears Intact Proprioception: Appears Intact Stereognosis: Not tested Coordination Gross Motor  Movements are Fluid and Coordinated: Yes Fine Motor Movements are Fluid and Coordinated: Yes Coordination and Movement Description: grossly coordinated within WB restrictions Finger Nose Finger Test: Grossly WFL Heel Shin Test: limited 2/2 decreased range of motion, strength and flexibility Motor  Motor Motor: Within Functional Limits Motor - Skilled Clinical Observations: WFL, improved coordination Motor - Discharge Observations: WFL within precautions  Mobility Bed Mobility Bed Mobility: Rolling Right;Rolling Left;Supine to Sit;Sit to Supine Rolling Right: Independent Rolling Left: Independent Supine to Sit: Independent with assistive device Sit to Supine: Independent with assistive device Transfers Transfers: Sit to Stand;Stand to Sit;Stand Pivot Transfers;Squat Pivot Transfers Sit to Stand: Independent with assistive device Stand to Sit: Independent with assistive device Stand Pivot Transfers: Independent with assistive device Squat Pivot Transfers: Independent with assistive device Transfer (Assistive device): Rolling walker Locomotion  Gait Ambulation: Yes Gait Assistance: Supervision/Verbal cueing Gait Distance (Feet): 15 Feet Assistive device: Rolling walker Gait Gait: Yes Gait Pattern: Within Functional Limits Gait velocity: decreased Stairs / Additional Locomotion Stairs: Yes Stairs Assistance: Minimal Assistance - Patient > 75% Stair Management Technique: One rail Right;Other (comment) (one rail and shower chair) Number of Stairs: 4 Height of Stairs: 6 Curb: Minimal Assistance - Patient >75% Naval architect Mobility: Yes Wheelchair Assistance: Independent with Scientist, research (life sciences): Both upper extremities Wheelchair Parts Management: Independent Distance: 300 ft  Trunk/Postural Assessment  Cervical Assessment Cervical Assessment: Within Functional Limits Thoracic Assessment Thoracic Assessment: Within Functional  Limits Lumbar Assessment Lumbar Assessment: Within Functional Limits Postural Control Postural Control: Within Functional Limits  Balance Balance Balance Assessed: Yes Static Sitting Balance Static Sitting - Balance Support: No upper extremity supported;Feet supported Static Sitting - Level of Assistance: 7: Independent Dynamic Sitting Balance Dynamic Sitting - Balance Support: Feet supported;During functional activity;No upper extremity supported Dynamic Sitting - Level of Assistance: 7: Independent Sitting balance -  Comments: able to do full latral lean for pericare/ LB dressing Static Standing Balance Static Standing - Balance Support: Bilateral upper extremity supported;During functional activity Static Standing - Level of Assistance: 6: Modified independent (Device/Increase time) Dynamic Standing Balance Dynamic Standing - Balance Support: Bilateral upper extremity supported Dynamic Standing - Level of Assistance: 6: Modified independent (Device/Increase time) Dynamic Standing - Balance Activities: Forward lean/weight shifting;Reaching for objects;Reaching across midline Extremity Assessment  RUE Assessment RUE Assessment: Within Functional Limits Active Range of Motion (AROM) Comments: WFL General Strength Comments: 5/5 overall LUE Assessment LUE Assessment: Within Functional Limits Active Range of Motion (AROM) Comments: WFL General Strength Comments: 5/5 overall RLE Assessment RLE Assessment: Within Functional Limits LLE Assessment LLE Assessment: Exceptions to [facility] General Strength Comments: hip and knee WFL, ankle limited due to cast    PT Treatment Session:   Pt received prone sleeping in bed and arouses to voice. Pt with unrated left LE pain, deferred to room level session and plan to practice stair negotiation later in the day.   Pt emotional and verbose throughout session. PT utilized therapeutic use of self to provide emotional support as pt preservative on  conflict with hospital staff and difficulties coping with injuries. Recreational therapist arrived in session and also provided support to patient.   PT assessed pain interference, sensation, coordination, strength and balance in preparation for discharge. Pt made MOD I within hospital room and pt excited to have increased independence. Pt missed remaining minutes of therapy due to pain as she states she wants to reposition and rest prior to next PT session as she received all pain medications prior to current PT session.    Truitt Leep Truitt Leep PT, DPT  10/12/2022, 5:13 PM

## 2022-10-12 NOTE — Progress Notes (Signed)
Recreational Therapy Discharge Summary Patient Details  Name: Judy Wood MRN: [id number] Date of Birth: 1995/06/21 Today's Date: 10/12/2022  Comments on progress toward goals: Pt has made good progress during LOS and is scheduled for d/c home tomorrow.  TR sessions focused on leisure education, activity analysis/modifications, relaxation strategies.  Pt is discharging home at Mod I w/c level.  Goal met.  Reasons for discharge: discharge from hospital  Follow-up: Outpatient  Patient/family agrees with progress made and goals achieved: Yes  [name] 10/12/2022, 10:50 AM

## 2022-10-12 NOTE — Progress Notes (Signed)
Physical Therapy Session Note  Patient Details  Name: Judy Wood MRN: [id number] Date of Birth: December 23, 1995  Today's Date: 10/12/2022 PT Individual Time: 0930-0955 PT Individual Time Calculation (min): 25 min   Short Term Goals: Week 1:  PT Short Term Goal 1 (Week 1): =LTGs d/t ELOS  Skilled Therapeutic Interventions/Progress Updates:    Chart reviewed and pt agreeable to therapy. Pt received seated in WC with no c/o pain. Session focused on review of WC and amb safety in preparation for safe d/c to home. Pt completed long practice of WC mobility for >1063ft around various surfaces including inclines and uneven terrain. Session education emphasized safe use of WC in community. Pt made modI in room with WC. At end of session, pt was left seated in [facility] with  nurse call bell and all needs in reach.     Therapy Documentation Precautions:  Precautions Precautions: Fall Precaution Comments: L ankle cast, LLE NWB, RLE WBAT Required Braces or Orthoses: Splint/Cast Splint/Cast: L ankle Restrictions Weight Bearing Restrictions: Yes RLE Weight Bearing: Weight bearing as tolerated LLE Weight Bearing: Non weight bearing Other Position/Activity Restrictions: okay for knee motion General:      Therapy/Group: Individual Therapy  Dionne Milo, PT, DPT 10/12/2022, 10:49 AM
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
## 2022-10-12 NOTE — Progress Notes (Signed)
Occupational Therapy Discharge Summary  Patient Details  Name: Judy Wood MRN: [id number] Date of Birth: 1995-08-26  Date of Discharge from OT service:October 12, 2022  Today's Date: 10/12/2022 OT Individual Time: 1000-1045 & 1415-1525 OT Individual Time Calculation (min): 45 min & 70 min    Patient has met 7 of 7 long term goals due to improved activity tolerance, improved balance, ability to compensate for deficits, and improved awareness.  Patient to discharge at overall Modified Independent level.  Patient's care partner is independent to provide the necessary physical assistance at discharge.    Reasons goals not met: N/A  Recommendation:  Patient will benefit from ongoing skilled OT services in home health setting to continue to advance functional skills in the area of BADL, iADL, and Reduce care partner burden.  Equipment: W/C and shower seat  Reasons for discharge: treatment goals met and discharge from hospital  Patient/family agrees with progress made and goals achieved: Yes  OT Discharge Precautions/Restrictions  Precautions Precautions: Fall Precaution Comments: L ankle cast, LLE NWB, RLE WBAT Required Braces or Orthoses: Splint/Cast Splint/Cast: LLE Restrictions Weight Bearing Restrictions: Yes RLE Weight Bearing: Weight bearing as tolerated LLE Weight Bearing: Non weight bearing Other Position/Activity Restrictions: okay for knee motion General Chart Reviewed: Yes PT Missed Treatment Reason: Pain Family/Caregiver Present: No Vital Signs Therapy Vitals Temp: 98.5 F (36.9 C) Temp Source: Oral Pulse Rate: (!) 105 Resp: 17 BP: (!) 152/66 Patient Position (if appropriate): Lying Oxygen Therapy SpO2: 100 % O2 Device: Room Air Pain Pain Assessment Pain Scale: 0-10 Pain Score: 6  Pain Type: Acute pain Pain Location: Ankle Pain Orientation: Right Pain Descriptors / Indicators: Aching Pain Frequency: Intermittent Pain Onset: On-going Pain  Intervention(s): Medication (See eMAR) ADL ADL Eating: Modified independent Grooming: Modified independent Where Assessed-Grooming: Sitting at sink Upper Body Bathing: Modified independent Where Assessed-Upper Body Bathing: Shower Lower Body Bathing: Modified independent Where Assessed-Lower Body Bathing: Shower Upper Body Dressing: Modified independent (Device) Where Assessed-Upper Body Dressing: Edge of bed Lower Body Dressing: Modified independent Where Assessed-Lower Body Dressing: Sitting at sink, Standing at sink Toileting: Modified independent Where Assessed-Toileting: Neurosurgeon Method: Surveyor, minerals: Drop arm bedside commode Tub/Shower Transfer: Modified independent Web designer Method: Engineer, technical sales: Insurance underwriter: Modified independent Film/video editor Method: Warden/ranger: Information systems manager with back Vision Baseline Vision/History: 1 Wears glasses Patient Visual Report: No change from baseline;Central vision impairment Perception  Perception: Within Functional Limits Praxis Praxis: WFL Cognition Cognition Overall Cognitive Status: Within Functional Limits for tasks assessed Arousal/Alertness: Awake/alert Memory: Appears intact Attention: Selective Selective Attention: Appears intact Awareness: Appears intact Problem Solving: Appears intact Safety/Judgment: Appears intact Brief Interview for Mental Status (BIMS) Repetition of Three Words (First Attempt): 3 Temporal Orientation: Year: Correct Temporal Orientation: Month: Accurate within 5 days Temporal Orientation: Day: Correct Recall: "Sock": Yes, no cue required Recall: "Blue": Yes, no cue required Recall: "Bed": Yes, no cue required BIMS Summary Score: 15 Sensation Sensation Light Touch: Impaired by gross assessment Central sensation comments: UE in  tact Peripheral sensation comments: impaired light touch in LLE toes Light Touch Impaired Details: Impaired LLE Hot/Cold: Appears Intact Proprioception: Appears Intact Stereognosis: Not tested Coordination Gross Motor Movements are Fluid and Coordinated: Yes Fine Motor Movements are Fluid and Coordinated: Yes Coordination and Movement Description: grossly dyscoordinated d/t pain and WB precautions Motor  Motor Motor: Within Functional Limits Motor - Discharge Observations: WFL within precautions Mobility  Bed Mobility Bed Mobility: Supine to Sit;Sit to Supine Supine to Sit: Independent with assistive device Sit to Supine: Independent with assistive device Transfers Sit to Stand: Independent with assistive device  Trunk/Postural Assessment  Cervical Assessment Cervical Assessment: Within Functional Limits Thoracic Assessment Thoracic Assessment: Within Functional Limits Lumbar Assessment Lumbar Assessment: Within Functional Limits  Balance Balance Balance Assessed: Yes Static Sitting Balance Static Sitting - Balance Support: No upper extremity supported;Feet supported Static Sitting - Level of Assistance: 6: Modified independent (Device/Increase time) Dynamic Sitting Balance Dynamic Sitting - Balance Support: Feet supported;During functional activity;No upper extremity supported Dynamic Sitting - Level of Assistance: 6: Modified independent (Device/Increase time) Sitting balance - Comments: able to do full latral lean for pericare/ LB dressing Static Standing Balance Static Standing - Balance Support: Bilateral upper extremity supported;During functional activity Static Standing - Level of Assistance: 6: Modified independent (Device/Increase time) Dynamic Standing Balance Dynamic Standing - Balance Support: Bilateral upper extremity supported Dynamic Standing - Level of Assistance: 6: Modified independent (Device/Increase time) Dynamic Standing - Balance Activities: Forward  lean/weight shifting;Reaching for objects Extremity/Trunk Assessment RUE Assessment RUE Assessment: Within Functional Limits Active Range of Motion (AROM) Comments: WFL General Strength Comments: 5/5 overall LUE Assessment LUE Assessment: Within Functional Limits Active Range of Motion (AROM) Comments: WFL General Strength Comments: 5/5 overall  Session 1 General: "It was healing being in here." Pt seated in W/C upon OT arrival, agreeable to OT.  Pain: 4/10 pain reported in LLE, activity, intermittent rest breaks, distractions provided for pain management, pt reports tolerable to proceed.   ADL: Tub/shower transfers: Mod I using tub/shower bench into tub/shower combo stand pivot transfer  IADL: Pt participated in simulated simple meal prep in ADL kitchen by maneuvering items out of cabinets and onto counter. Pt educated on placing items at countertop level in order for easier access at W/C level.   Other Treatments: Pt demonstrated community mobility with W/C around unit in order to prepare for community integration at D/C. Pt propelled in W/C requiring no rest breaks d/t increased activity tolerance. Pt practices maneuvering around obstacles and propelling at advanced distances.   Pt seated in W/C at end of session with W/C alarm donned, call light within reach and 4Ps assessed.   Session 2 General: "I can't wait to leave!" Pt supine in bed upon OT arrival, agreeable to OT session.  Pain:  6/10 pain reported in LLE, activity, intermittent rest breaks, distractions provided for pain management, pt reports tolerable to proceed.   ADL: Bed mobility: Mod I supine>EOB Grooming: Mod I seated in W/C at sink applying lotion UB dressing: Mod I seated in W/C for overhead shirt LB dressing: Mod I in W/C seated and standing at RW for managing pants over waist Footwear: Mod I for slip on shoes seated in W/C Shower transfer: Mod I with RW stand pivot transfer Bathing: Mod I seated in  shower, lateral leaning for peri hygiene, LLE covered Transfers: Mod I with stand pivots with RW and squat pivots  Pt seated in W/C at end of session with W/C alarm donned, call light within reach and 4Ps assessed.    Velia Meyer, OTD, OTR/L 10/12/2022, 3:42 PM

## 2022-10-13 ENCOUNTER — Other Ambulatory Visit (HOSPITAL_COMMUNITY): Payer: Self-pay

## 2022-10-13 MED ORDER — PRAZOSIN HCL 1 MG PO CAPS
4.0000 mg | ORAL_CAPSULE | Freq: Every day | ORAL | 1 refills | Status: DC
  Filled 2022-10-13: qty 120, 30d supply, fill #0
  Filled 2022-11-07: qty 120, 30d supply, fill #1

## 2022-10-13 MED ORDER — METHOCARBAMOL 500 MG PO TABS
500.0000 mg | ORAL_TABLET | Freq: Four times a day (QID) | ORAL | 1 refills | Status: DC
  Filled 2022-10-13: qty 120, 30d supply, fill #0
  Filled 2022-11-07: qty 120, 30d supply, fill #1

## 2022-10-13 MED ORDER — CHOLECALCIFEROL 25 MCG (1000 UT) PO TABS
2000.0000 [IU] | ORAL_TABLET | Freq: Every day | ORAL | 0 refills | Status: DC
  Filled 2022-10-13: qty 180, 90d supply, fill #0

## 2022-10-13 MED ORDER — ACETAMINOPHEN 325 MG PO TABS: 325.0000 mg | ORAL_TABLET | Freq: Four times a day (QID) | ORAL | Status: DC | PRN

## 2022-10-13 MED ORDER — ASPIRIN 325 MG PO TBEC
325.0000 mg | DELAYED_RELEASE_TABLET | Freq: Every day | ORAL | 0 refills | Status: AC
  Filled 2022-10-13: qty 100, 100d supply, fill #0

## 2022-10-13 MED ORDER — GABAPENTIN 300 MG PO CAPS
300.0000 mg | ORAL_CAPSULE | Freq: Two times a day (BID) | ORAL | 1 refills | Status: DC
  Filled 2022-10-13: qty 60, 30d supply, fill #0
  Filled 2022-11-07: qty 60, 30d supply, fill #1

## 2022-10-13 MED ORDER — AMLODIPINE BESYLATE 10 MG PO TABS
10.0000 mg | ORAL_TABLET | Freq: Every day | ORAL | 1 refills | Status: DC
  Filled 2022-10-13: qty 30, 30d supply, fill #0
  Filled 2022-11-07: qty 30, 30d supply, fill #1

## 2022-10-13 MED ORDER — HYDROCODONE-ACETAMINOPHEN 7.5-325 MG PO TABS
1.0000 | ORAL_TABLET | ORAL | 0 refills | Status: DC | PRN
  Filled 2022-10-13: qty 28, 5d supply, fill #0
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
## 2022-10-13 NOTE — Progress Notes (Signed)
Inpatient Rehabilitation Care Coordinator Discharge Note   Patient Details  Name: Judy Wood MRN: [id number] Date of Birth: 29-Jun-1995   Discharge location: GOING TO MOMS' HOME WHERE SHE AND PT'S GRANDMOTHER CAN ASSIST  Length of Stay: 7 DAYS  Discharge activity level: MOD/I WHEELCHAIR LEVEL  Home/community participation: ACTIVE  Patient response VH:QIONGE Literacy - How often do you need to have someone help you when you read instructions, pamphlets, or other written material from your doctor or pharmacy?: Never  Patient response XB:MWUXLK Isolation - How often do you feel lonely or isolated from those around you?: Never  Services provided included: MD, RD, PT, OT, RN, CM, TR, Pharmacy, Neuropsych, SW  Financial Services:  Field seismologist Utilized: Medicaid    Choices offered to/list presented to: PT  Follow-up services arranged:  DME, Patient/Family has no preference for HH/DME agencies      DME : [facility]  WHEELCHAIR AND TUB SEAT WILL GET ROLLING WALKER ON OWN AND HAS TUB BENCH   WILL NEED FOLLOW UP ONCE CAN WB. [facility] AND [facility] GIVEN TO PT TO FOLLOW UP ON FOR COUNSELING. WILL NEED TO USE PCP LISTED ON MEDICAID CARD OR CALL HER MEDICAID WORKER TO GET SWITCHED.  Patient response to transportation need: Is the patient able to respond to transportation needs?: Yes In the past 12 months, has lack of transportation kept you from medical appointments or from getting medications?: No In the past 12 months, has lack of transportation kept you from meetings, work, or from getting things needed for daily living?: No   Patient/Family verbalized understanding of follow-up arrangements:  Yes  Individual responsible for coordination of the follow-up plan: self [phone]  Confirmed correct DME delivered: Lucy Chris 10/13/2022    Comments (or additional information):PT DID WELL AND PROGRESSED CAN NOT GO  FURTHER UNTIL CAN WB ON HER LEG. WANTED TO BE HOME FOR SON'S BIRTHDAY WHICH IS TODAY.   Summary of Stay    Date/Time Discharge Planning CSW  10/11/22 [phone] Going home with Mom where her children are and grandmother to assist also. Will need equipment, follow up counseling and eventual OP once can WB RGD       Nadiya Pieratt, Lemar Livings

## 2022-10-13 NOTE — Progress Notes (Signed)
Inpatient Rehabilitation Discharge Medication Review by a Pharmacist  A complete drug regimen review was completed for this patient to identify any potential clinically significant medication issues.  High Risk Drug Classes Is patient taking? Indication by Medication  Antipsychotic No   Anticoagulant No   Antibiotic No   Opioid Yes Vicodin prn pain  Antiplatelet No   Hypoglycemics/insulin No   Vasoactive Medication Yes Amlodipine - BP  Chemotherapy No   Other Yes Gabapentin - pain Prazosin - PTSD Methocarbamol - muscle spasms     Type of Medication Issue Identified Description of Issue Recommendation(s)  Drug Interaction(s) (clinically significant)     Duplicate Therapy     Allergy     No Medication Administration End Date     Incorrect Dose     Additional Drug Therapy Needed     Significant med changes from prior encounter (inform family/care partners about these prior to discharge).    Other       Clinically significant medication issues were identified that warrant physician communication and completion of prescribed/recommended actions by midnight of the next day:  No  Name of provider notified for urgent issues identified:   Provider Method of Notification:     Pharmacist comments:   Time spent performing this drug regimen review (minutes):  20 minutes  Thank you Okey Regal, PharmD

## 2022-10-18 ENCOUNTER — Telehealth: Payer: Self-pay | Admitting: *Deleted

## 2022-10-18 MED ORDER — HYDROCODONE-ACETAMINOPHEN 5-325 MG PO TABS: 1.0000 | ORAL_TABLET | Freq: Four times a day (QID) | ORAL | 0 refills | Status: DC | PRN

## 2022-10-18 NOTE — Telephone Encounter (Signed)
Judy Wood called for a refill on her hydrocodone. She was told to call the office and also re schedule a hospital follow up appointment with Dr Berline Chough.

## 2022-10-19 NOTE — Telephone Encounter (Signed)
Notified of change in rx dose and directions.

## 2022-10-29 ENCOUNTER — Emergency Department (HOSPITAL_COMMUNITY): Payer: Medicaid Other

## 2022-10-29 ENCOUNTER — Emergency Department (HOSPITAL_COMMUNITY)
Admission: EM | Admit: 2022-10-29 | Discharge: 2022-10-29 | Disposition: A | Discharge status: Home / Self Care | Payer: Medicaid Other | Source: Home / Self Care | Attending: Emergency Medicine | Admitting: Emergency Medicine

## 2022-10-29 ENCOUNTER — Other Ambulatory Visit: Payer: Self-pay

## 2022-10-29 ENCOUNTER — Encounter (HOSPITAL_COMMUNITY): Payer: Self-pay

## 2022-10-29 DIAGNOSIS — Z3491 Encounter for supervision of normal pregnancy, unspecified, first trimester: Secondary | ICD-10-CM

## 2022-10-29 DIAGNOSIS — O99711 Diseases of the skin and subcutaneous tissue complicating pregnancy, first trimester: Secondary | ICD-10-CM | POA: Diagnosis not present

## 2022-10-29 DIAGNOSIS — G8918 Other acute postprocedural pain: Secondary | ICD-10-CM | POA: Insufficient documentation

## 2022-10-29 DIAGNOSIS — O26891 Other specified pregnancy related conditions, first trimester: Secondary | ICD-10-CM | POA: Insufficient documentation

## 2022-10-29 DIAGNOSIS — Z3A00 Weeks of gestation of pregnancy not specified: Secondary | ICD-10-CM | POA: Diagnosis not present

## 2022-10-29 DIAGNOSIS — L03116 Cellulitis of left lower limb: Secondary | ICD-10-CM | POA: Diagnosis not present

## 2022-10-29 LAB — CBC WITH DIFFERENTIAL/PLATELET
Abs Immature Granulocytes: 0.01 10*3/uL (ref 0.00–0.07)
Basophils Absolute: 0 10*3/uL (ref 0.0–0.1)
Basophils Relative: 1 %
Eosinophils Absolute: 0.1 10*3/uL (ref 0.0–0.5)
Eosinophils Relative: 1 %
HCT: 33.8 % — ABNORMAL LOW (ref 36.0–46.0)
Hemoglobin: 10.6 g/dL — ABNORMAL LOW (ref 12.0–15.0)
Immature Granulocytes: 0 %
Lymphocytes Relative: 37 %
Lymphs Abs: 1.8 10*3/uL (ref 0.7–4.0)
MCH: 28.2 pg (ref 26.0–34.0)
MCHC: 31.4 g/dL (ref 30.0–36.0)
MCV: 89.9 fL (ref 80.0–100.0)
Monocytes Absolute: 0.5 10*3/uL (ref 0.1–1.0)
Monocytes Relative: 10 %
Neutro Abs: 2.6 10*3/uL (ref 1.7–7.7)
Neutrophils Relative %: 51 %
Platelets: 301 10*3/uL (ref 150–400)
RBC: 3.76 MIL/uL — ABNORMAL LOW (ref 3.87–5.11)
RDW: 13.3 % (ref 11.5–15.5)
WBC: 5 10*3/uL (ref 4.0–10.5)
nRBC: 0 % (ref 0.0–0.2)

## 2022-10-29 LAB — BASIC METABOLIC PANEL
Anion gap: 12 (ref 5–15)
BUN: 12 mg/dL (ref 6–20)
CO2: 20 mmol/L — ABNORMAL LOW (ref 22–32)
Calcium: 9.1 mg/dL (ref 8.9–10.3)
Chloride: 106 mmol/L (ref 98–111)
Creatinine, Ser: 0.65 mg/dL (ref 0.44–1.00)
GFR, Estimated: >60 mL/min (ref >60)
Glucose, Bld: 91 mg/dL (ref 70–99)
Potassium: 4 mmol/L (ref 3.5–5.1)
Sodium: 138 mmol/L (ref 135–145)

## 2022-10-29 LAB — I-STAT CG4 LACTIC ACID, ED: Lactic Acid, Venous: 1.4 mmol/L (ref 0.5–1.9)

## 2022-10-29 LAB — HCG, SERUM, QUALITATIVE: Preg, Serum: POSITIVE — AB

## 2022-10-29 MED ORDER — CLINDAMYCIN HCL 150 MG PO CAPS: 450.0000 mg | ORAL_CAPSULE | Freq: Once | ORAL | Status: DC

## 2022-10-29 MED ORDER — MORPHINE SULFATE (PF) 4 MG/ML IV SOLN
4.0000 mg | Freq: Once | INTRAVENOUS | Status: AC
  Administered 2022-10-29: 4 mg via INTRAVENOUS
  Filled 2022-10-29: qty 1

## 2022-10-29 MED ORDER — ONDANSETRON HCL 4 MG/2ML IJ SOLN
4.0000 mg | Freq: Once | INTRAMUSCULAR | Status: AC
  Administered 2022-10-29: 4 mg via INTRAVENOUS
  Filled 2022-10-29: qty 2

## 2022-10-29 MED ORDER — KETOROLAC TROMETHAMINE 15 MG/ML IJ SOLN
15.0000 mg | Freq: Once | INTRAMUSCULAR | Status: AC
  Administered 2022-10-29: 15 mg via INTRAVENOUS
  Filled 2022-10-29: qty 1

## 2022-10-29 MED ORDER — CEPHALEXIN 500 MG PO CAPS: 500.0000 mg | ORAL_CAPSULE | Freq: Three times a day (TID) | ORAL | 0 refills | Status: AC
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
## 2022-10-29 NOTE — Discharge Instructions (Addendum)
Thank you for letting us take care of you today.  You do have significant swelling over your foot and ankle with skin changes that look like a condition called cellulitis or an infection of the skin.  Your x-ray shows the swelling but does not show any infection to the bone.  Your blood work is reassuring and we do not see signs of infection in your bloodstream.  Your pregnancy test was positive.  This limits the pain medication that we are able to use.  You may take up to 1000 mg of Tylenol every 6 hours as needed for pain.  It is important to take all of the antibiotics even if you start to feel better.  Follow-up with your orthopedic surgeon first thing next week to discuss your ED visit today and that you are currently being treated for an infection at your previous surgical site.  You need to be rechecked by an orthopedic next week.  I provided 2 other orthopedic clinics that you may follow-up with if you so desire.  For new or worsening symptoms, return to the nearest ED for reevaluation.

## 2022-10-29 NOTE — ED Triage Notes (Signed)
Pt c/o pain at incision site on left ankle. Pt states had stitches removed on Tuesday. Pt has scabbed area in incision area. Pt states she's having white/yellowish drainage from incision area.

## 2022-10-31 ENCOUNTER — Emergency Department (HOSPITAL_COMMUNITY)
Admission: EM | Admit: 2022-10-31 | Discharge: 2022-10-31 | Disposition: A | Discharge status: Home / Self Care | Payer: Medicaid Other | Attending: Emergency Medicine | Admitting: Emergency Medicine

## 2022-10-31 ENCOUNTER — Encounter (HOSPITAL_COMMUNITY): Payer: Self-pay

## 2022-10-31 ENCOUNTER — Other Ambulatory Visit: Payer: Self-pay

## 2022-10-31 ENCOUNTER — Emergency Department (HOSPITAL_COMMUNITY): Payer: Medicaid Other

## 2022-10-31 DIAGNOSIS — S92102G Unspecified fracture of left talus, subsequent encounter for fracture with delayed healing: Secondary | ICD-10-CM | POA: Insufficient documentation

## 2022-10-31 DIAGNOSIS — Z3A00 Weeks of gestation of pregnancy not specified: Secondary | ICD-10-CM | POA: Insufficient documentation

## 2022-10-31 DIAGNOSIS — O9A219 Injury, poisoning and certain other consequences of external causes complicating pregnancy, unspecified trimester: Secondary | ICD-10-CM | POA: Diagnosis present

## 2022-10-31 DIAGNOSIS — O26899 Other specified pregnancy related conditions, unspecified trimester: Secondary | ICD-10-CM | POA: Insufficient documentation

## 2022-10-31 DIAGNOSIS — X58XXXD Exposure to other specified factors, subsequent encounter: Secondary | ICD-10-CM | POA: Insufficient documentation

## 2022-10-31 LAB — URINALYSIS, ROUTINE W REFLEX MICROSCOPIC
Glucose, UA: NEGATIVE mg/dL
Hgb urine dipstick: NEGATIVE
Ketones, ur: NEGATIVE mg/dL
Nitrite: NEGATIVE
Protein, ur: 30 mg/dL — AB
Specific Gravity, Urine: 1.04 — ABNORMAL HIGH (ref 1.005–1.030)
Squamous Epithelial / HPF: >50 /HPF (ref 0–5)
pH: 5 (ref 5.0–8.0)

## 2022-10-31 LAB — CBC WITH DIFFERENTIAL/PLATELET
Abs Immature Granulocytes: 0.02 10*3/uL (ref 0.00–0.07)
Basophils Absolute: 0 10*3/uL (ref 0.0–0.1)
Basophils Relative: 1 %
Eosinophils Absolute: 0.1 10*3/uL (ref 0.0–0.5)
Eosinophils Relative: 1 %
HCT: 37 % (ref 36.0–46.0)
Hemoglobin: 11.3 g/dL — ABNORMAL LOW (ref 12.0–15.0)
Immature Granulocytes: 0 %
Lymphocytes Relative: 35 %
Lymphs Abs: 2.3 10*3/uL (ref 0.7–4.0)
MCH: 27.7 pg (ref 26.0–34.0)
MCHC: 30.5 g/dL (ref 30.0–36.0)
MCV: 90.7 fL (ref 80.0–100.0)
Monocytes Absolute: 0.6 10*3/uL (ref 0.1–1.0)
Monocytes Relative: 10 %
Neutro Abs: 3.5 10*3/uL (ref 1.7–7.7)
Neutrophils Relative %: 53 %
Platelets: 326 10*3/uL (ref 150–400)
RBC: 4.08 MIL/uL (ref 3.87–5.11)
RDW: 13.3 % (ref 11.5–15.5)
WBC: 6.6 10*3/uL (ref 4.0–10.5)
nRBC: 0 % (ref 0.0–0.2)

## 2022-10-31 LAB — BASIC METABOLIC PANEL
Anion gap: 9 (ref 5–15)
BUN: 11 mg/dL (ref 6–20)
CO2: 20 mmol/L — ABNORMAL LOW (ref 22–32)
Calcium: 9.1 mg/dL (ref 8.9–10.3)
Chloride: 108 mmol/L (ref 98–111)
Creatinine, Ser: 0.64 mg/dL (ref 0.44–1.00)
GFR, Estimated: >60 mL/min (ref >60)
Glucose, Bld: 95 mg/dL (ref 70–99)
Potassium: 3.5 mmol/L (ref 3.5–5.1)
Sodium: 137 mmol/L (ref 135–145)

## 2022-10-31 LAB — I-STAT CG4 LACTIC ACID, ED: Lactic Acid, Venous: 1 mmol/L (ref 0.5–1.9)

## 2022-10-31 MED ORDER — OXYCODONE-ACETAMINOPHEN 5-325 MG PO TABS
1.0000 | ORAL_TABLET | Freq: Once | ORAL | Status: AC
  Administered 2022-10-31: 1 via ORAL
  Filled 2022-10-31: qty 1

## 2022-10-31 MED ORDER — OXYCODONE HCL 5 MG PO TABS: 5.0000 mg | ORAL_TABLET | Freq: Four times a day (QID) | ORAL | 0 refills | Status: DC | PRN

## 2022-10-31 NOTE — ED Provider Notes (Signed)
[place] EMERGENCY DEPARTMENT AT [facility] Provider Note   CSN: [id number] Arrival date & time: 10/31/22  1444     History  Chief Complaint  Patient presents with   Ankle Pain    Judy Wood is a 27 y.o. female.  Patient is a 27 year old female with a past medical history of hypertension, an open talus fracture status post repair at the end of July of this year and recent positive pregnancy test presenting to the emergency department with ankle pain.  Patient states that since Friday she has had increased pain in her left ankle.  She states that she has noticed some purulent drainage.  She was seen in the emergency department on Friday and was started on Keflex but states that in the last day her pain has worsened.  She states it feels like a burning pain in her foot.  She has been taking Tylenol without significant relief.  She states that she is still noticed yellow drainage from her wound.  She denies any spreading redness.  She states that she felt feverish last night but did not check her temperature at home.  She denies any numbness or weakness.  The history is provided by the patient.  Ankle Pain      Home Medications Prior to Admission medications   Medication Sig Start Date End Date Taking? Authorizing Provider  oxyCODONE (ROXICODONE) 5 MG immediate release tablet Take 1 tablet (5 mg total) by mouth every 6 (six) hours as needed. 10/31/22  Yes Elayne Snare K, DO  acetaminophen (TYLENOL) 325 MG tablet Take 1 tablet (325 mg total) by mouth every 6 (six) hours as needed for mild pain. 10/13/22   Setzer, Lynnell Jude, PA-C  amLODipine (NORVASC) 10 MG tablet Take 1 tablet (10 mg total) by mouth daily. 10/13/22 10/13/23  Setzer, Lynnell Jude, PA-C  aspirin EC 325 MG tablet Take 1 tablet (325 mg total) by mouth daily. 10/13/22 01/21/23  Setzer, Lynnell Jude, PA-C  cephALEXin (KEFLEX) 500 MG capsule Take 1 capsule (500 mg total) by mouth 3 (three) times daily for 5 days. 10/29/22  11/03/22  Gowens, Lawrence Marseilles, PA-C  Cholecalciferol 25 MCG (1000 UT) tablet Take 2 tablets (2,000 Units total) by mouth daily at 2 PM. 10/13/22   Setzer, Lynnell Jude, PA-C  gabapentin (NEURONTIN) 300 MG capsule Take 1 capsule (300 mg total) by mouth 2 (two) times daily. 10/13/22   Setzer, Lynnell Jude, PA-C  HYDROcodone-acetaminophen (NORCO) 5-325 MG tablet Take 1-1.5 tablets by mouth every 6 (six) hours as needed for moderate pain. 10/18/22   Lovorn, Aundra Millet, MD  HYDROcodone-acetaminophen (NORCO) 7.5-325 MG tablet Take 1 tablet by mouth every 4 (four) hours as needed for severe pain. 10/13/22   Setzer, Lynnell Jude, PA-C  methocarbamol (ROBAXIN) 500 MG tablet Take 1 tablet (500 mg total) by mouth 4 (four) times daily. 10/13/22   Setzer, Lynnell Jude, PA-C  prazosin (MINIPRESS) 1 MG capsule Take 4 capsules (4 mg total) by mouth at bedtime. 10/13/22   Setzer, Lynnell Jude, PA-C      Allergies    Banana, Lactose intolerance (gi), and Sulfa antibiotics    Review of Systems   Review of Systems  Physical Exam Updated Vital Signs BP (!) 161/99 (BP Location: Right Wrist)   Pulse 94   Temp 98.2 F (36.8 C) (Oral)   Resp 18   Wt 123.8 kg   LMP  (LMP Unknown)   SpO2 100%   BMI 46.86 kg/m  Physical Exam  Vitals and nursing note reviewed.  Constitutional:      General: She is not in acute distress.    Appearance: Normal appearance.  HENT:     Head: Normocephalic and atraumatic.     Nose: Nose normal.     Mouth/Throat:     Mouth: Mucous membranes are moist.  Eyes:     Extraocular Movements: Extraocular movements intact.     Conjunctiva/sclera: Conjunctivae normal.  Cardiovascular:     Rate and Rhythm: Normal rate and regular rhythm.     Pulses: Normal pulses.  Pulmonary:     Effort: Pulmonary effort is normal.     Breath sounds: Normal breath sounds.  Abdominal:     General: Abdomen is flat.     Palpations: Abdomen is soft.  Musculoskeletal:        General: Normal range of motion.     Cervical back: Normal  range of motion.     Comments: Active plantar/dorsi flexion intact in the left ankle with some pain, full passive ROM without pain, mild diffuse tenderness to palpation of left ankle with mild ankle swelling  Skin:    General: Skin is warm and dry.     Comments: Scabbing to the mild wound dehiscence to her left ankle without active drainage, no surrounding erythema or warmth  Neurological:     General: No focal deficit present.     Mental Status: She is alert and oriented to person, place, and time.     Sensory: No sensory deficit.     Motor: No weakness.  Psychiatric:        Mood and Affect: Mood normal.        Behavior: Behavior normal.     ED Results / Procedures / Treatments   Labs (all labs ordered are listed, but only abnormal results are displayed) Labs Reviewed  CBC WITH DIFFERENTIAL/PLATELET - Abnormal; Notable for the following components:      Result Value   Hemoglobin 11.3 (*)    All other components within normal limits  BASIC METABOLIC PANEL - Abnormal; Notable for the following components:   CO2 20 (*)    All other components within normal limits  URINALYSIS, ROUTINE W REFLEX MICROSCOPIC - Abnormal; Notable for the following components:   APPearance CLOUDY (*)    Specific Gravity, Urine 1.040 (*)    Bilirubin Urine MODERATE (*)    Protein, ur 30 (*)    Leukocytes,Ua SMALL (*)    Bacteria, UA RARE (*)    All other components within normal limits  I-STAT CG4 LACTIC ACID, ED    EKG None  Radiology DG Ankle Complete Left  Result Date: 10/31/2022 CLINICAL DATA:  Ankle pain with burning sensation. Recent ankle fracture with fixation. EXAM: LEFT ANKLE COMPLETE - 3+ VIEW COMPARISON:  CT 09/28/2022. Ankle radiographs 09/30/2022 and foot radiographs 10/29/2022. FINDINGS: Stable postsurgical changes from cannulated screw ORIF of the previously demonstrated talar neck fracture and subtalar arthrodesis. Today's lateral view is mildly limited by rotation, although suggests  interval displacement at the fracture of the talar neck. No hardware loosening identified. There is no evidence of acute fracture or dislocation. Generalized soft tissue swelling noted. IMPRESSION: 1. Possible interval displacement at the talar neck fracture post ORIF. No hardware displacement identified. 2. Generalized soft tissue swelling. 3. Stable postsurgical changes. Electronically Signed   By: Carey Bullocks M.D.   On: 10/31/2022 17:11    Procedures Procedures    Medications Ordered in ED Medications  oxyCODONE-acetaminophen (PERCOCET/ROXICET) 5-325  MG per tablet 1 tablet (1 tablet Oral Given 10/31/22 1737)    ED Course/ Medical Decision Making/ A&P Clinical Course as of 10/31/22 1857  Mon Oct 31, 2022  1715 XR with possible interval displacement at the talar neck fracture post ORIF, no hardware displacement, no obvious evidence of osteo. Will discuss with ortho if she may need re-splinting. [VK]  1739 Patient reports cast was taken off on Tuesday and given a boot. Reports has not been wearing the boot because it has been uncomfortable and rubbing against her scar. Denies any new trauma or falls. [VK]  1753 I spoke with orthopedics who recommended posterior short leg and stirrup splint and follow up with [facility] next week. [VK]    Clinical Course User Index [VK] Rexford Maus, DO                                 Medical Decision Making This patient presents to the ED with chief complaint(s) of L ankle pain with pertinent past medical history of ~1 month pregnant, prior open L talus fracture s/p repair with recent treatment for cellulitis which further complicates the presenting complaint. The complaint involves an extensive differential diagnosis and also carries with it a high risk of complications and morbidity.    The differential diagnosis includes patient has no redness or warmth to her ankle with full ankle ROM making a septic joint unlikely, no surrounding erythema or  warmth making cellulitis or failure of outpatient antibiotics unlikely, considering osteomyelitis, sepsis, arthritis/postop pain  Additional history obtained: Additional history obtained from family Records reviewed previous admission documents  ED Course and Reassessment: On patient's arrival she was mildly tachycardic and hypertensive otherwise in no acute distress.  She was initially evaluated by triage and had labs and ankle x-ray performed.  Labs showed normal WBC and normal lactate, no signs of sepsis.  The patient's x-ray is pending at this time.  She has no obvious signs concerning for a septic joint on exam or worsening cellulitis.  Per recent ED records does seem that her erythema and warmth has resolved with antibiotics.  She was given Percocet for pain control and will be closely reassessed.  Independent labs interpretation:  The following labs were independently interpreted: Within normal range  Independent visualization of imaging: - I independently visualized the following imaging with scope of interpretation limited to determining acute life threatening conditions related to emergency care: Left ankle x-ray, which revealed possible displacement of prior talus fracture status post ORIF, hardware in place  Consultation: - Consulted or discussed management/test interpretation w/ external professional: Orthopedics  Consideration for admission or further workup: Patient has no emergent conditions requiring admission or further work-up at this time and is stable for discharge home with orthopedics follow-up  Social Determinants of health: N/A    Amount and/or Complexity of Data Reviewed Labs: ordered. Radiology: ordered.  Risk Prescription drug management.          Final Clinical Impression(s) / ED Diagnoses Final diagnoses:  Open displaced fracture of left talus with delayed healing, unspecified portion of talus, subsequent encounter    Rx / DC Orders ED  Discharge Orders          Ordered    oxyCODONE (ROXICODONE) 5 MG immediate release tablet  Every 6 hours PRN        10/31/22 1856  Rexford Maus, DO 10/31/22 1857

## 2022-10-31 NOTE — ED Notes (Signed)
Splint placed by ortho tech. Patient declined crutches. ED attending physician, Dr. Theresia Lo, made aware via face-to-face conversation.

## 2022-10-31 NOTE — Progress Notes (Signed)
Orthopedic Tech Progress Note Patient Details:  Judy Wood 1995/05/23 [id number]  Ortho Devices Type of Ortho Device: Short leg splint Ortho Device/Splint Location: LLE Ortho Device/Splint Interventions: Ordered, Application   Post Interventions Patient Tolerated: Well Instructions Provided: Poper ambulation with device, Care of device  Rydan Gulyas A Sherrell Weir 10/31/2022, 7:31 PM

## 2022-10-31 NOTE — Discharge Instructions (Addendum)
You were seen in the emergency department for your ankle pain.  Your x-ray showed possible displacement of your bones since your surgery though your hardware does still appear to be in place.  You have no signs of worsening infection to your ankle.  Orthopedics recommended placing you back into a splint and we have given you crutches.  You should wear your splint at all times until you are further instructed by orthopedics.  You can take Tylenol every 6 hours as needed for pain and I have given you a few oxycodone to take for breakthrough pain.  This can make you drowsy so do not take it before driving, working, operating heavy machinery or watching small children alone.  You should avoid NSAIDs such as ibuprofen or Aleve as you are pregnant.  You should continue to complete your antibiotics as prescribed.  You can follow-up with your orthopedic doctor within the next week to have your symptoms rechecked.  You should return to the emergency department if you have fevers despite the antibiotics, increasing redness or swelling to your ankle or any other new or concerning symptoms.

## 2022-10-31 NOTE — ED Triage Notes (Signed)
Pt arrives with left ankle pain and swelling that has worsened over the last day. Pt had surgery recently and states she is having yellowish drainage from incision. No redness noted at incision. Per pt, she pregnant, but unsure how far along. LMP in July.

## 2022-11-01 ENCOUNTER — Telehealth: Payer: Self-pay | Admitting: Family Medicine

## 2022-11-01 DIAGNOSIS — Z3490 Encounter for supervision of normal pregnancy, unspecified, unspecified trimester: Secondary | ICD-10-CM

## 2022-11-01 NOTE — Telephone Encounter (Signed)
Patient state she went to the ED and found out that she was pregnant,   patient is unsure of LMP,  she want to know if she cane get an Ultra Sound
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
## 2022-11-07 ENCOUNTER — Other Ambulatory Visit: Payer: Medicaid Other

## 2022-11-07 ENCOUNTER — Other Ambulatory Visit (HOSPITAL_BASED_OUTPATIENT_CLINIC_OR_DEPARTMENT_OTHER): Payer: Self-pay

## 2022-11-07 ENCOUNTER — Other Ambulatory Visit: Payer: Self-pay | Admitting: Physical Medicine and Rehabilitation

## 2022-11-07 ENCOUNTER — Ambulatory Visit (INDEPENDENT_AMBULATORY_CARE_PROVIDER_SITE_OTHER): Payer: Medicaid Other

## 2022-11-07 DIAGNOSIS — Z3A01 Less than 8 weeks gestation of pregnancy: Secondary | ICD-10-CM

## 2022-11-07 DIAGNOSIS — Z3490 Encounter for supervision of normal pregnancy, unspecified, unspecified trimester: Secondary | ICD-10-CM | POA: Diagnosis not present

## 2022-11-09 ENCOUNTER — Other Ambulatory Visit: Payer: Self-pay

## 2022-11-09 MED ORDER — PRENATAL 28-0.8 MG PO TABS: 1.0000 | ORAL_TABLET | Freq: Every day | ORAL | 12 refills | Status: AC

## 2022-11-09 NOTE — Progress Notes (Signed)
Pt requesting PNV. Rx sent to pharmacy.  Judeth Cornfield, RNC

## 2022-11-11 ENCOUNTER — Telehealth: Payer: Self-pay

## 2022-11-11 ENCOUNTER — Inpatient Hospital Stay: Payer: Medicaid Other | Admitting: Physical Medicine and Rehabilitation

## 2022-11-11 NOTE — Telephone Encounter (Signed)
Task completed

## 2022-11-11 NOTE — Telephone Encounter (Signed)
Per patient she is GOOD! She is currently pregnant and is NOT taking any pain medications. Patient has been advised to follow up with her OBGYN for pain control.  If needed, call PM&R back for future needs after pregnancy.

## 2022-11-11 NOTE — Progress Notes (Signed)
This encounter was created in error - please disregard.

## 2022-11-16 ENCOUNTER — Encounter: Payer: Medicaid Other | Admitting: Physical Medicine and Rehabilitation

## 2022-11-16 ENCOUNTER — Telehealth: Payer: Self-pay

## 2022-11-16 DIAGNOSIS — O3680X0 Pregnancy with inconclusive fetal viability, not applicable or unspecified: Secondary | ICD-10-CM

## 2022-11-16 NOTE — Telephone Encounter (Signed)
Per Dr. Vergie Living, she needs another viability u/s 2wks from when she had this 9/9 u/s.  Pt advised providers recommendation and agreed to U/S on 11/21/22 at 1515.    Judy Wood  11/16/22

## 2022-11-21 ENCOUNTER — Ambulatory Visit: Payer: Medicaid Other

## 2022-11-21 DIAGNOSIS — O3680X0 Pregnancy with inconclusive fetal viability, not applicable or unspecified: Secondary | ICD-10-CM

## 2022-11-21 DIAGNOSIS — Z3A00 Weeks of gestation of pregnancy not specified: Secondary | ICD-10-CM | POA: Diagnosis not present

## 2022-11-22 ENCOUNTER — Other Ambulatory Visit: Payer: Self-pay | Admitting: Physical Medicine and Rehabilitation

## 2022-12-14 ENCOUNTER — Telehealth: Payer: Medicaid Other

## 2022-12-14 DIAGNOSIS — O0990 Supervision of high risk pregnancy, unspecified, unspecified trimester: Secondary | ICD-10-CM | POA: Insufficient documentation

## 2022-12-14 DIAGNOSIS — O0991 Supervision of high risk pregnancy, unspecified, first trimester: Secondary | ICD-10-CM

## 2022-12-14 DIAGNOSIS — Z3A10 10 weeks gestation of pregnancy: Secondary | ICD-10-CM

## 2022-12-14 NOTE — Progress Notes (Signed)
New OB Intake  I connected with Cheryll D Cropp  on 12/14/22 at  1:15 PM EDT by MyChart Video Visit and verified that I am speaking with the correct person using two identifiers. Nurse is located at [facility] and pt is located at home.  I discussed the limitations, risks, security and privacy concerns of performing an evaluation and management service by telephone and the availability of in person appointments. I also discussed with the patient that there may be a patient responsible charge related to this service. The patient expressed understanding and agreed to proceed.  I explained I am completing New OB Intake today. We discussed EDD of Not found.. Pt is [id number]. I reviewed her allergies, medications and Medical/Surgical/OB history.    Patient Active Problem List   Diagnosis Date Noted   Supervision of other normal pregnancy, antepartum 12/14/2022   Fracture of right proximal fibula 10/06/2022   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   Open dislocation of left talus 10/06/2022   Open left ankle fracture, sequela 10/06/2022   PTSD (post-traumatic stress disorder) 10/06/2022   Displaced fracture of neck of left talus, initial encounter for open fracture 09/28/2022   Depression 12/30/2015   Well woman exam with routine gynecological exam 12/30/2015   Breast screening 12/30/2015   History of syphilis 11/24/2014   Obesity 11/24/2014   Hypertension 10/17/2011    Concerns addressed today  Delivery Plans Plans to deliver at [facility]. Discussed the nature of our practice with multiple providers including residents and students. Due to the size of the practice, the delivering provider may not be the same as those providing prenatal care.   Patient  is not  interested in water birth. Offered upcoming OB visit with CNM to discuss further.  MyChart/Babyscripts MyChart access verified. I explained pt will have some visits in office and some virtually. Babyscripts instructions given and  order placed. Patient verifies receipt of registration text/e-mail. Account successfully created and app downloaded.  Blood Pressure Cuff/Weight Scale Pt has own BP Cuff, Explained after first prenatal appt pt will check weekly and document in Babyscripts. Patient does have weight scale.  Anatomy US Explained first scheduled [place] will be around 19 weeks. Anatomy US scheduled for 02/13/23 at 0815a.  Is patient a CenteringPregnancy candidate?    Not a candidate due to [facility], medication controlled If accepted,    Is patient a Mom+Baby Combined Care candidate?     If accepted, confirm patient does not intend to move from the area for at least 12 months, then notify Mom+Baby staff  Interested in [place]? If yes, send referral and doula dot phrase.   Is patient a candidate for Babyscripts Optimization? No - CHTN-Med Control  First visit review I reviewed new OB appt with patient. Explained pt will be seen by Dr. Macon Large at first visit. Discussed Avelina Laine genetic screening with patient. needs Panorama and Horizon.. Routine prenatal labs  needed at [facility] visit.    Last Pap Diagnosis  Date Value Ref Range Status  12/30/2015   Final   NEGATIVE FOR INTRAEPITHELIAL LESIONS OR MALIGNANCY.    Henrietta Dine, CMA 12/14/2022  1:53 PM

## 2022-12-20 ENCOUNTER — Ambulatory Visit: Payer: Medicaid Other
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
## 2022-12-21 ENCOUNTER — Encounter: Payer: Self-pay | Admitting: Physical Therapy

## 2022-12-21 ENCOUNTER — Other Ambulatory Visit: Payer: Self-pay

## 2022-12-21 ENCOUNTER — Other Ambulatory Visit (HOSPITAL_COMMUNITY)
Admission: RE | Admit: 2022-12-21 | Discharge: 2022-12-21 | Disposition: A | Discharge status: Home / Self Care | Payer: Medicaid Other | Source: Ambulatory Visit | Attending: Obstetrics & Gynecology | Admitting: Obstetrics & Gynecology

## 2022-12-21 ENCOUNTER — Ambulatory Visit: Payer: Medicaid Other | Admitting: Obstetrics & Gynecology

## 2022-12-21 ENCOUNTER — Encounter: Payer: Self-pay | Admitting: Obstetrics & Gynecology

## 2022-12-21 ENCOUNTER — Ambulatory Visit: Payer: Medicaid Other | Attending: Student | Admitting: Physical Therapy

## 2022-12-21 VITALS — BP 137/98 | HR 99

## 2022-12-21 DIAGNOSIS — F341 Dysthymic disorder: Secondary | ICD-10-CM

## 2022-12-21 DIAGNOSIS — S82892S Other fracture of left lower leg, sequela: Secondary | ICD-10-CM | POA: Insufficient documentation

## 2022-12-21 DIAGNOSIS — Z3A11 11 weeks gestation of pregnancy: Secondary | ICD-10-CM | POA: Diagnosis not present

## 2022-12-21 DIAGNOSIS — M25572 Pain in left ankle and joints of left foot: Secondary | ICD-10-CM | POA: Insufficient documentation

## 2022-12-21 DIAGNOSIS — N760 Acute vaginitis: Secondary | ICD-10-CM

## 2022-12-21 DIAGNOSIS — M6281 Muscle weakness (generalized): Secondary | ICD-10-CM | POA: Insufficient documentation

## 2022-12-21 DIAGNOSIS — O0990 Supervision of high risk pregnancy, unspecified, unspecified trimester: Secondary | ICD-10-CM | POA: Insufficient documentation

## 2022-12-21 DIAGNOSIS — O10919 Unspecified pre-existing hypertension complicating pregnancy, unspecified trimester: Secondary | ICD-10-CM | POA: Diagnosis not present

## 2022-12-21 DIAGNOSIS — O26893 Other specified pregnancy related conditions, third trimester: Secondary | ICD-10-CM | POA: Diagnosis present

## 2022-12-21 DIAGNOSIS — E6689 Other obesity not elsewhere classified: Secondary | ICD-10-CM | POA: Insufficient documentation

## 2022-12-21 DIAGNOSIS — O0991 Supervision of high risk pregnancy, unspecified, first trimester: Secondary | ICD-10-CM | POA: Diagnosis not present

## 2022-12-21 DIAGNOSIS — F4310 Post-traumatic stress disorder, unspecified: Secondary | ICD-10-CM

## 2022-12-21 DIAGNOSIS — Z98891 History of uterine scar from previous surgery: Secondary | ICD-10-CM | POA: Diagnosis not present

## 2022-12-21 DIAGNOSIS — R2689 Other abnormalities of gait and mobility: Secondary | ICD-10-CM | POA: Diagnosis present

## 2022-12-21 DIAGNOSIS — N898 Other specified noninflammatory disorders of vagina: Secondary | ICD-10-CM

## 2022-12-21 DIAGNOSIS — O99211 Obesity complicating pregnancy, first trimester: Secondary | ICD-10-CM

## 2022-12-21 DIAGNOSIS — O26891 Other specified pregnancy related conditions, first trimester: Secondary | ICD-10-CM

## 2022-12-21 DIAGNOSIS — O99213 Obesity complicating pregnancy, third trimester: Secondary | ICD-10-CM | POA: Diagnosis not present

## 2022-12-21 DIAGNOSIS — O10911 Unspecified pre-existing hypertension complicating pregnancy, first trimester: Secondary | ICD-10-CM

## 2022-12-21 DIAGNOSIS — F1190 Opioid use, unspecified, uncomplicated: Secondary | ICD-10-CM | POA: Insufficient documentation

## 2022-12-21 DIAGNOSIS — B9689 Other specified bacterial agents as the cause of diseases classified elsewhere: Secondary | ICD-10-CM

## 2022-12-21 DIAGNOSIS — Z8619 Personal history of other infectious and parasitic diseases: Secondary | ICD-10-CM

## 2022-12-21 DIAGNOSIS — Z8759 Personal history of other complications of pregnancy, childbirth and the puerperium: Secondary | ICD-10-CM

## 2022-12-21 MED ORDER — ASPIRIN 81 MG PO TBEC
162.0000 mg | DELAYED_RELEASE_TABLET | Freq: Every day | ORAL | 2 refills | Status: DC
Start: 2022-12-21 — End: 2023-06-29
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
## 2022-12-21 NOTE — Patient Instructions (Signed)
Access Code: [email or web address] URL: [email or web address] Date: 12/21/2022 Prepared by: Rosana Hoes  Exercises - Seated Heel Toe Raises  - 5 x daily - 20 reps - Seated Calf Stretch with Strap  - 5 x daily - 5 reps - 15 seconds hold - Side to Side Weight Shift with Counter Support  - 5 x daily - 10 reps - Forward Backward Weight Shift with Counter Support  - 5 x daily - 10 reps

## 2022-12-21 NOTE — Addendum Note (Signed)
Addended by: Henrietta Dine on: 12/21/2022 04:21 PM   Modules accepted: Orders

## 2022-12-22 LAB — CERVICOVAGINAL ANCILLARY ONLY
Bacterial Vaginitis (gardnerella): POSITIVE — AB
Candida Glabrata: NEGATIVE
Candida Vaginitis: NEGATIVE
Chlamydia: NEGATIVE
Comment: NEGATIVE
Comment: NEGATIVE
Comment: NEGATIVE
Comment: NEGATIVE
Comment: NEGATIVE
Comment: NORMAL
Neisseria Gonorrhea: NEGATIVE
Trichomonas: NEGATIVE

## 2022-12-22 LAB — CBC/D/PLT+RPR+RH+ABO+RUBIGG...
Antibody Screen: NEGATIVE
Basophils Absolute: 0.1 10*3/uL (ref 0.0–0.2)
Basos: 1 %
EOS (ABSOLUTE): 0.1 10*3/uL (ref 0.0–0.4)
Eos: 1 %
HCV Ab: NONREACTIVE
HIV Screen 4th Generation wRfx: NONREACTIVE
Hematocrit: 38.8 % (ref 34.0–46.6)
Hemoglobin: 12.5 g/dL (ref 11.1–15.9)
Hepatitis B Surface Ag: NEGATIVE
Immature Grans (Abs): 0 10*3/uL (ref 0.0–0.1)
Immature Granulocytes: 0 %
Lymphocytes Absolute: 2.9 10*3/uL (ref 0.7–3.1)
Lymphs: 39 %
MCH: 28 pg (ref 26.6–33.0)
MCHC: 32.2 g/dL (ref 31.5–35.7)
MCV: 87 fL (ref 79–97)
Monocytes Absolute: 0.9 10*3/uL (ref 0.1–0.9)
Monocytes: 12 %
Neutrophils Absolute: 3.5 10*3/uL (ref 1.4–7.0)
Neutrophils: 47 %
Platelets: 366 10*3/uL (ref 150–450)
RBC: 4.46 x10E6/uL (ref 3.77–5.28)
RDW: 14.2 % (ref 11.7–15.4)
RPR Ser Ql: REACTIVE — AB
Rh Factor: POSITIVE
Rubella Antibodies, IGG: 2.55 {index} (ref >0.99)
WBC: 7.4 10*3/uL (ref 3.4–10.8)

## 2022-12-22 LAB — COMPREHENSIVE METABOLIC PANEL
ALT: 7 [IU]/L (ref 0–32)
AST: 12 [IU]/L (ref 0–40)
Albumin: 4.2 g/dL (ref 4.0–5.0)
Alkaline Phosphatase: 68 [IU]/L (ref 44–121)
BUN/Creatinine Ratio: 15 (ref 9–23)
BUN: 7 mg/dL (ref 6–20)
Bilirubin Total: <0.2 mg/dL (ref 0.0–1.2)
CO2: 17 mmol/L — ABNORMAL LOW (ref 20–29)
Calcium: 9.7 mg/dL (ref 8.7–10.2)
Chloride: 101 mmol/L (ref 96–106)
Creatinine, Ser: 0.46 mg/dL — ABNORMAL LOW (ref 0.57–1.00)
Globulin, Total: 3.2 g/dL (ref 1.5–4.5)
Glucose: 80 mg/dL (ref 70–99)
Potassium: 4.1 mmol/L (ref 3.5–5.2)
Sodium: 135 mmol/L (ref 134–144)
Total Protein: 7.4 g/dL (ref 6.0–8.5)
eGFR: 134 mL/min/{1.73_m2} (ref >59)

## 2022-12-22 LAB — RPR, QUANT+TP ABS (REFLEX)
Rapid Plasma Reagin, Quant: 1:16 {titer} — ABNORMAL HIGH
T Pallidum Abs: REACTIVE — AB

## 2022-12-22 LAB — PROTEIN / CREATININE RATIO, URINE
Creatinine, Urine: 179.6 mg/dL
Protein, Ur: 15.8 mg/dL
Protein/Creat Ratio: 88 mg/g{creat} (ref 0–200)

## 2022-12-22 LAB — HEMOGLOBIN A1C
Est. average glucose Bld gHb Est-mCnc: 114 mg/dL
Hgb A1c MFr Bld: 5.6 % (ref 4.8–5.6)

## 2022-12-22 LAB — HCV INTERPRETATION

## 2022-12-22 MED ORDER — METRONIDAZOLE 500 MG PO TABS
500.0000 mg | ORAL_TABLET | Freq: Two times a day (BID) | ORAL | 0 refills | Status: AC
Start: 2022-12-22 — End: 2022-12-29

## 2022-12-22 NOTE — Addendum Note (Signed)
Addended by: Jaynie Collins A on: 12/22/2022 04:47 PM   Modules accepted: Orders

## 2022-12-23 LAB — URINE CULTURE, OB REFLEX

## 2022-12-23 LAB — CULTURE, OB URINE

## 2022-12-26 ENCOUNTER — Telehealth: Payer: Self-pay

## 2022-12-26 ENCOUNTER — Ambulatory Visit: Payer: Medicaid Other | Admitting: Physical Therapy

## 2022-12-26 ENCOUNTER — Encounter: Payer: Self-pay | Admitting: Physical Therapy

## 2022-12-26 DIAGNOSIS — S82892S Other fracture of left lower leg, sequela: Secondary | ICD-10-CM

## 2022-12-26 DIAGNOSIS — M6281 Muscle weakness (generalized): Secondary | ICD-10-CM

## 2022-12-26 DIAGNOSIS — R2689 Other abnormalities of gait and mobility: Secondary | ICD-10-CM

## 2022-12-26 DIAGNOSIS — M25572 Pain in left ankle and joints of left foot: Secondary | ICD-10-CM

## 2022-12-26 LAB — CYTOLOGY - PAP: Diagnosis: NEGATIVE
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
## 2022-12-26 NOTE — Telephone Encounter (Signed)
Judy Wood from [facility] called regarding positive RPR at new OB appt. I explained results have not yet been reviewed by provider. Will forward to provider for review. Patient does have previous history of syphilis, but increase in titer may show new infection with need for treatment.

## 2022-12-27 ENCOUNTER — Encounter: Payer: Self-pay | Admitting: General Practice

## 2022-12-27 ENCOUNTER — Telehealth: Payer: Self-pay | Admitting: Family Medicine

## 2022-12-27 NOTE — Telephone Encounter (Signed)
Front office called to schedule patient for nurse visits weekly x 3 weeks for Bicillin injection per Anyanwu, MD. Pt notified by MyChart prior to phone call.

## 2022-12-27 NOTE — Telephone Encounter (Signed)
Tried calling patient to set up appt but there was no answer or VM to leave message.

## 2022-12-28 ENCOUNTER — Telehealth: Payer: Self-pay

## 2022-12-28 ENCOUNTER — Ambulatory Visit: Payer: Medicaid Other

## 2022-12-28 VITALS — BP 133/88 | HR 96 | Ht 64.0 in | Wt 267.0 lb

## 2022-12-28 DIAGNOSIS — Z3A12 12 weeks gestation of pregnancy: Secondary | ICD-10-CM | POA: Diagnosis not present

## 2022-12-28 DIAGNOSIS — O98112 Syphilis complicating pregnancy, second trimester: Secondary | ICD-10-CM

## 2022-12-28 LAB — HORIZON CUSTOM: REPORT SUMMARY: NEGATIVE

## 2022-12-28 MED ORDER — PENICILLIN G BENZATHINE 1200000 UNIT/2ML IM SUSY
2.4000 10*6.[IU] | PREFILLED_SYRINGE | Freq: Once | INTRAMUSCULAR | Status: AC
Start: 2022-12-28 — End: 2022-12-28
  Administered 2022-12-28: 2.4 10*6.[IU] via INTRAMUSCULAR

## 2022-12-28 NOTE — Telephone Encounter (Signed)
Opened in error  [facility]

## 2022-12-28 NOTE — Telephone Encounter (Signed)
-----   Message from Jaynie Collins sent at 12/27/2022  4:49 PM EDT ----- Patient needs retreatment of her syphilis. Please refer to [facility] accordingly.

## 2022-12-28 NOTE — Progress Notes (Signed)
Pt here today for 1st dose treatment for Syphillis.  Pt reports that she has had a elevated blood pressure at home but she takes at night when she is in a lot of pain from her foot surgery.   BP RA 133/88.  Pt denies headache and changes in vision.  Administered Bicillin in right and left upper quad.  Pt tolerated well.  Pt scheduled for 01/05/23 for 2nd dose.  Pt advised that she has a OB provider appt on 01/17/23 and to please call with questions or concerns.  Pt verbalized understanding with no further questions.   Edwardine Deschepper,RN  12/28/22

## 2022-12-29 LAB — PANORAMA PRENATAL TEST FULL PANEL:PANORAMA TEST PLUS 5 ADDITIONAL MICRODELETIONS: FETAL FRACTION: 6.3

## 2023-01-03 ENCOUNTER — Ambulatory Visit: Payer: Medicaid Other | Attending: Student

## 2023-01-03 DIAGNOSIS — M6281 Muscle weakness (generalized): Secondary | ICD-10-CM | POA: Diagnosis present

## 2023-01-03 DIAGNOSIS — R2689 Other abnormalities of gait and mobility: Secondary | ICD-10-CM | POA: Diagnosis present

## 2023-01-03 DIAGNOSIS — S82892S Other fracture of left lower leg, sequela: Secondary | ICD-10-CM | POA: Insufficient documentation

## 2023-01-03 DIAGNOSIS — M25572 Pain in left ankle and joints of left foot: Secondary | ICD-10-CM | POA: Diagnosis present
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
## 2023-01-05 ENCOUNTER — Ambulatory Visit: Payer: Medicaid Other

## 2023-01-05 ENCOUNTER — Telehealth: Payer: Self-pay

## 2023-01-05 ENCOUNTER — Ambulatory Visit: Payer: Medicaid Other | Admitting: *Deleted

## 2023-01-05 VITALS — BP 137/87 | HR 94 | Ht 63.0 in | Wt 270.1 lb

## 2023-01-05 DIAGNOSIS — O98112 Syphilis complicating pregnancy, second trimester: Secondary | ICD-10-CM | POA: Diagnosis not present

## 2023-01-05 DIAGNOSIS — Z3A13 13 weeks gestation of pregnancy: Secondary | ICD-10-CM

## 2023-01-05 MED ORDER — PENICILLIN G BENZATHINE 1200000 UNIT/2ML IM SUSY
2.4000 10*6.[IU] | PREFILLED_SYRINGE | Freq: Once | INTRAMUSCULAR | Status: AC
Start: 2023-01-05 — End: 2023-01-05
  Administered 2023-01-05: 2.4 10*6.[IU] via INTRAMUSCULAR

## 2023-01-05 NOTE — Telephone Encounter (Signed)
Attempted to call patient regarding missed appointment, call rings once and then goes to a busy signal. Attempted multiple times, same outcome. Unable to leave a VM.  1st no-show  Judy Wood, [place] 01/05/23 12:41 PM

## 2023-01-05 NOTE — Progress Notes (Signed)
Here for second treatment for syphilis. Injection given without complaint.  Reviewed next appointment. She voices understanding. [name]

## 2023-01-06 NOTE — BH Specialist Note (Signed)
Pt did not arrive to video visit and did not answer the phone; Left HIPPA-compliant message to call back Aryahna Spagna from [facility]  [facility] at  [phone] (Ilir Mahrt's office).  ?; left MyChart message for patient.  ? ?

## 2023-01-10 ENCOUNTER — Ambulatory Visit: Payer: Medicaid Other | Attending: Cardiology | Admitting: Cardiology

## 2023-01-10 ENCOUNTER — Ambulatory Visit: Payer: Medicaid Other

## 2023-01-10 DIAGNOSIS — R2689 Other abnormalities of gait and mobility: Secondary | ICD-10-CM

## 2023-01-10 DIAGNOSIS — M25572 Pain in left ankle and joints of left foot: Secondary | ICD-10-CM

## 2023-01-10 DIAGNOSIS — M6281 Muscle weakness (generalized): Secondary | ICD-10-CM

## 2023-01-10 DIAGNOSIS — S82892S Other fracture of left lower leg, sequela: Secondary | ICD-10-CM
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
## 2023-01-12 ENCOUNTER — Ambulatory Visit: Payer: Medicaid Other | Admitting: Physical Therapy

## 2023-01-12 ENCOUNTER — Ambulatory Visit: Payer: Self-pay

## 2023-01-12 ENCOUNTER — Telehealth: Payer: Self-pay | Admitting: Physical Therapy
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
## 2023-01-15 ENCOUNTER — Encounter: Payer: Self-pay | Admitting: Family Medicine

## 2023-01-15 MED ORDER — LABETALOL HCL 200 MG PO TABS: 200.0000 mg | ORAL_TABLET | Freq: Two times a day (BID) | ORAL | 3 refills | Status: DC

## 2023-01-17 ENCOUNTER — Ambulatory Visit: Payer: Medicaid Other

## 2023-01-17 ENCOUNTER — Ambulatory Visit (INDEPENDENT_AMBULATORY_CARE_PROVIDER_SITE_OTHER): Payer: Medicaid Other | Admitting: Family Medicine

## 2023-01-17 VITALS — BP 148/90 | HR 91 | Wt 273.0 lb

## 2023-01-17 DIAGNOSIS — O99212 Obesity complicating pregnancy, second trimester: Secondary | ICD-10-CM

## 2023-01-17 DIAGNOSIS — F341 Dysthymic disorder: Secondary | ICD-10-CM

## 2023-01-17 DIAGNOSIS — O10919 Unspecified pre-existing hypertension complicating pregnancy, unspecified trimester: Secondary | ICD-10-CM

## 2023-01-17 DIAGNOSIS — Z23 Encounter for immunization: Secondary | ICD-10-CM

## 2023-01-17 DIAGNOSIS — S82892S Other fracture of left lower leg, sequela: Secondary | ICD-10-CM

## 2023-01-17 DIAGNOSIS — Z8759 Personal history of other complications of pregnancy, childbirth and the puerperium: Secondary | ICD-10-CM

## 2023-01-17 DIAGNOSIS — O99210 Obesity complicating pregnancy, unspecified trimester: Secondary | ICD-10-CM

## 2023-01-17 DIAGNOSIS — Z98891 History of uterine scar from previous surgery: Secondary | ICD-10-CM

## 2023-01-17 DIAGNOSIS — Z7251 High risk heterosexual behavior: Secondary | ICD-10-CM

## 2023-01-17 DIAGNOSIS — O10912 Unspecified pre-existing hypertension complicating pregnancy, second trimester: Secondary | ICD-10-CM

## 2023-01-17 DIAGNOSIS — O98112 Syphilis complicating pregnancy, second trimester: Secondary | ICD-10-CM | POA: Diagnosis not present

## 2023-01-17 DIAGNOSIS — O0990 Supervision of high risk pregnancy, unspecified, unspecified trimester: Secondary | ICD-10-CM

## 2023-01-17 DIAGNOSIS — F4310 Post-traumatic stress disorder, unspecified: Secondary | ICD-10-CM

## 2023-01-17 DIAGNOSIS — Z3A15 15 weeks gestation of pregnancy: Secondary | ICD-10-CM

## 2023-01-17 DIAGNOSIS — O0992 Supervision of high risk pregnancy, unspecified, second trimester: Secondary | ICD-10-CM

## 2023-01-17 MED ORDER — EMTRICITABINE-TENOFOVIR DF 200-300 MG PO TABS: 1.0000 | ORAL_TABLET | Freq: Every day | ORAL | 3 refills | Status: AC

## 2023-01-17 MED ORDER — PENICILLIN G BENZATHINE 1200000 UNIT/2ML IM SUSY
2.4000 10*6.[IU] | PREFILLED_SYRINGE | Freq: Once | INTRAMUSCULAR | Status: AC
Start: 2023-01-17 — End: 2023-01-17
  Administered 2023-01-17: 2.4 10*6.[IU] via INTRAMUSCULAR

## 2023-01-17 MED ORDER — LABETALOL HCL 300 MG PO TABS: 300.0000 mg | ORAL_TABLET | Freq: Two times a day (BID) | ORAL | 3 refills | Status: DC
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
## 2023-01-18 LAB — HIV ANTIBODY (ROUTINE TESTING W REFLEX): HIV Screen 4th Generation wRfx: NONREACTIVE

## 2023-01-19 ENCOUNTER — Ambulatory Visit: Payer: Medicaid Other

## 2023-01-19 DIAGNOSIS — M6281 Muscle weakness (generalized): Secondary | ICD-10-CM

## 2023-01-19 DIAGNOSIS — R2689 Other abnormalities of gait and mobility: Secondary | ICD-10-CM

## 2023-01-19 DIAGNOSIS — M25572 Pain in left ankle and joints of left foot: Secondary | ICD-10-CM | POA: Diagnosis not present
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
## 2023-01-20 ENCOUNTER — Ambulatory Visit: Payer: Self-pay | Admitting: Clinical

## 2023-01-20 DIAGNOSIS — Z91199 Patient's noncompliance with other medical treatment and regimen due to unspecified reason: Secondary | ICD-10-CM

## 2023-01-23 NOTE — Progress Notes (Unsigned)
Blood Pressure Check Visit  Judy Wood is here for blood pressure check; patient is currently [redacted]w[redacted]d GA and diagnosed with essential HTN. Patient is currently taking. BP today is ***. Patient denies/endorses any {Symptoms; hypertension cc:11129:s:"dizzness","blurred vision","headache","shortness of breath","peripheral edema"}.   [name] here for  bicillin   Injection for syphilis treatment. Patient received first injection on 01/17/23. Injection administered without complication. Patient will return in one week for next injection.     Meryl Crutch, RN 01/24/23 at

## 2023-01-24 ENCOUNTER — Ambulatory Visit: Payer: Medicaid Other

## 2023-01-24 VITALS — BP 145/92 | HR 88 | Wt 273.9 lb

## 2023-01-24 DIAGNOSIS — Z98891 History of uterine scar from previous surgery: Secondary | ICD-10-CM

## 2023-01-24 DIAGNOSIS — O98112 Syphilis complicating pregnancy, second trimester: Secondary | ICD-10-CM | POA: Diagnosis not present

## 2023-01-24 DIAGNOSIS — Z3A16 16 weeks gestation of pregnancy: Secondary | ICD-10-CM

## 2023-01-24 DIAGNOSIS — O0990 Supervision of high risk pregnancy, unspecified, unspecified trimester: Secondary | ICD-10-CM

## 2023-01-24 DIAGNOSIS — O99212 Obesity complicating pregnancy, second trimester: Secondary | ICD-10-CM

## 2023-01-24 DIAGNOSIS — O10919 Unspecified pre-existing hypertension complicating pregnancy, unspecified trimester: Secondary | ICD-10-CM

## 2023-01-24 DIAGNOSIS — O98119 Syphilis complicating pregnancy, unspecified trimester: Secondary | ICD-10-CM

## 2023-01-24 DIAGNOSIS — O0992 Supervision of high risk pregnancy, unspecified, second trimester: Secondary | ICD-10-CM

## 2023-01-24 DIAGNOSIS — O10912 Unspecified pre-existing hypertension complicating pregnancy, second trimester: Secondary | ICD-10-CM

## 2023-01-24 MED ORDER — PENICILLIN G BENZATHINE 1200000 UNIT/2ML IM SUSY
1.2000 10*6.[IU] | PREFILLED_SYRINGE | Freq: Once | INTRAMUSCULAR | Status: AC
Start: 2023-01-24 — End: 2023-01-24
  Administered 2023-01-24: 1.2 10*6.[IU] via INTRAMUSCULAR

## 2023-01-24 MED ORDER — BLOOD PRESSURE KIT DEVI: 1.0000 | Freq: Every day | 0 refills | Status: AC

## 2023-01-31 ENCOUNTER — Ambulatory Visit: Payer: Medicaid Other

## 2023-01-31 VITALS — BP 145/91 | HR 72 | Wt 277.0 lb

## 2023-01-31 DIAGNOSIS — O0990 Supervision of high risk pregnancy, unspecified, unspecified trimester: Secondary | ICD-10-CM

## 2023-01-31 DIAGNOSIS — O98112 Syphilis complicating pregnancy, second trimester: Secondary | ICD-10-CM | POA: Diagnosis not present

## 2023-01-31 DIAGNOSIS — O10919 Unspecified pre-existing hypertension complicating pregnancy, unspecified trimester: Secondary | ICD-10-CM

## 2023-01-31 DIAGNOSIS — O219 Vomiting of pregnancy, unspecified: Secondary | ICD-10-CM

## 2023-01-31 DIAGNOSIS — N898 Other specified noninflammatory disorders of vagina: Secondary | ICD-10-CM

## 2023-01-31 DIAGNOSIS — Z98891 History of uterine scar from previous surgery: Secondary | ICD-10-CM

## 2023-01-31 DIAGNOSIS — O98119 Syphilis complicating pregnancy, unspecified trimester: Secondary | ICD-10-CM

## 2023-01-31 DIAGNOSIS — Z3A17 17 weeks gestation of pregnancy: Secondary | ICD-10-CM | POA: Diagnosis not present

## 2023-01-31 MED ORDER — PROMETHAZINE HCL 25 MG PO TABS: 25.0000 mg | ORAL_TABLET | Freq: Four times a day (QID) | ORAL | 0 refills | Status: DC | PRN

## 2023-01-31 MED ORDER — PENICILLIN G BENZATHINE 1200000 UNIT/2ML IM SUSY
2.4000 10*6.[IU] | PREFILLED_SYRINGE | Freq: Once | INTRAMUSCULAR | Status: AC
  Administered 2023-01-31: 2.4 10*6.[IU] via INTRAMUSCULAR

## 2023-01-31 NOTE — Progress Notes (Signed)
Judy Wood here for third Bicillin Injection. Injection administered without complication.   Reports ongoing nausea and vomiting. Feels lightheaded at times when she is not able to eat. Now having heartburn. Offered prescription for PRN nausea med, which patient accepts. Phenergan sent per protocol. Also offered for patient to trial OTC Tums for heartburn.  History of chronic hypertension. Taking labetalol 300 mg BID as prescribed. BP today elevated 136/92, repeat 145/91. I stated to patient that I would like to review elevated BP with Cresenzo, MD because she may need changes to her medication. Pt states she will not change medications because she feels like we haven't given the increased labetalol prescription time to work. Plans to pick up BP cuff today and begin logging into [employer]. After patient left the office I reviewed with Cresenzo, MD that BP was elevated today and she is unwilling to change medications; provider states he would recommend increasing medication if patient was agreeable. Patient will return in 2 weeks for routine OB visit.   Marjo Bicker, RN 01/31/2023  11:57 AM

## 2023-01-31 NOTE — Patient Instructions (Addendum)
Pick up: blood pressure cuff at [facility] Promethazine (Phenergan) for nausea at [facility] Tums

## 2023-02-02 ENCOUNTER — Ambulatory Visit: Payer: Medicaid Other | Attending: Student

## 2023-02-02 DIAGNOSIS — R2689 Other abnormalities of gait and mobility: Secondary | ICD-10-CM | POA: Diagnosis present

## 2023-02-02 DIAGNOSIS — M25572 Pain in left ankle and joints of left foot: Secondary | ICD-10-CM | POA: Diagnosis present

## 2023-02-02 DIAGNOSIS — S82892S Other fracture of left lower leg, sequela: Secondary | ICD-10-CM | POA: Insufficient documentation

## 2023-02-02 DIAGNOSIS — M6281 Muscle weakness (generalized): Secondary | ICD-10-CM | POA: Diagnosis present
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
## 2023-02-09 ENCOUNTER — Ambulatory Visit: Payer: Medicaid Other

## 2023-02-13 ENCOUNTER — Ambulatory Visit: Payer: Medicaid Other | Attending: Obstetrics and Gynecology | Admitting: Obstetrics and Gynecology

## 2023-02-13 ENCOUNTER — Ambulatory Visit: Payer: Medicaid Other | Admitting: *Deleted

## 2023-02-13 ENCOUNTER — Ambulatory Visit: Payer: Medicaid Other | Attending: Obstetrics & Gynecology

## 2023-02-13 ENCOUNTER — Encounter: Payer: Self-pay | Admitting: *Deleted

## 2023-02-13 ENCOUNTER — Other Ambulatory Visit: Payer: Self-pay

## 2023-02-13 ENCOUNTER — Other Ambulatory Visit: Payer: Self-pay | Admitting: *Deleted

## 2023-02-13 VITALS — BP 139/90 | HR 90

## 2023-02-13 DIAGNOSIS — Z98891 History of uterine scar from previous surgery: Secondary | ICD-10-CM | POA: Insufficient documentation

## 2023-02-13 DIAGNOSIS — O99210 Obesity complicating pregnancy, unspecified trimester: Secondary | ICD-10-CM | POA: Diagnosis present

## 2023-02-13 DIAGNOSIS — Z3A18 18 weeks gestation of pregnancy: Secondary | ICD-10-CM

## 2023-02-13 DIAGNOSIS — O0990 Supervision of high risk pregnancy, unspecified, unspecified trimester: Secondary | ICD-10-CM | POA: Insufficient documentation

## 2023-02-13 DIAGNOSIS — O10919 Unspecified pre-existing hypertension complicating pregnancy, unspecified trimester: Secondary | ICD-10-CM | POA: Diagnosis present

## 2023-02-13 DIAGNOSIS — O99212 Obesity complicating pregnancy, second trimester: Secondary | ICD-10-CM | POA: Diagnosis not present

## 2023-02-13 DIAGNOSIS — E6689 Other obesity not elsewhere classified: Secondary | ICD-10-CM | POA: Diagnosis not present

## 2023-02-13 DIAGNOSIS — O10012 Pre-existing essential hypertension complicating pregnancy, second trimester: Secondary | ICD-10-CM

## 2023-02-13 DIAGNOSIS — O98312 Other infections with a predominantly sexual mode of transmission complicating pregnancy, second trimester: Secondary | ICD-10-CM

## 2023-02-13 DIAGNOSIS — A539 Syphilis, unspecified: Secondary | ICD-10-CM

## 2023-02-13 DIAGNOSIS — O09292 Supervision of pregnancy with other poor reproductive or obstetric history, second trimester: Secondary | ICD-10-CM

## 2023-02-13 DIAGNOSIS — O10912 Unspecified pre-existing hypertension complicating pregnancy, second trimester: Secondary | ICD-10-CM

## 2023-02-13 DIAGNOSIS — O34219 Maternal care for unspecified type scar from previous cesarean delivery: Secondary | ICD-10-CM | POA: Diagnosis not present

## 2023-02-13 DIAGNOSIS — E669 Obesity, unspecified: Secondary | ICD-10-CM

## 2023-02-13 NOTE — Progress Notes (Signed)
Maternal-Fetal Medicine  G3 [phone].  Patient is here for fetal anatomy scan. Past medical history significant for chronic hypertension.  Patient takes labetalol.  She takes low-dose aspirin prophylaxis. Blood pressure today at our office 839/90 mmHg. Obstetric history significant for a term cesarean delivery followed by a VBAC. On cell-free fetal DNA screening, the risks of fetal aneuploidies are not increased.  Her second pregnancy was, apparently, complicated by placental abruption.  Both her children are in good health.  Her EDD should be based on 11/21/22 ultrasound where CRL was measured (not gestational sac). We have assigned her EDD at 07/14/23.  We performed fetal anatomy scan. No makers of aneuploidies or fetal structural defects are seen. Fetal biometry is consistent with her previously-established dates. Amniotic fluid is normal and good fetal activity is seen. Patient understands the limitations of ultrasound in detecting fetal anomalies. Placenta is anterior and there is no evidence of previa or placenta accreta spectrum.  A small left ovarian cyst (dermoid) is seen (measurements above).  As maternal obesity imposes limitations on the resolution of images, fetal anomalies may be missed.  Chronic hypertension -Adverse outcomes of severe chronic hypertension include maternal stroke, endorgan damage, coagulation disturbances. Placental abruption is more common. -Superimposed preeclampsia occurs in more than 30% of women with chronic hypertension I discussed the benefit of low-dose aspirin prophylaxis that helps delaying or preventing preeclampsia. -I discussed the safety profile of antihypertensives.  Labetalol can be safely given in pregnancy.  It can be associated with low birthweights.  Alternative medications include nifedipine. -I discussed ultrasound protocol of monitoring fetal growth assessment and antenatal testing. -Timing of delivery: Provided her blood pressures are well  controlled, she can be delivered at 39 weeks' gestation.  Early term delivery is an option if hypertension is not well controlled.  History of cesarean delivery Patient had a VBAC and the success rate for another VBAC is higher.  I explained that repeat cesarean deliveries can increase the risk of placenta previa and/placenta accreta spectrum.  Recommendations -An appointment was made for her to return in 5 weeks for completion of fetal anatomy. -Fetal growth assessments every 4 weeks till delivery. -Weekly BPP from [redacted] weeks gestation till delivery. -Provided her blood pressures are well-controlled, she can be delivered at [redacted] weeks gestation.  Consultation including face-to-face (more than 50%) counseling 30 minutes.

## 2023-02-14 ENCOUNTER — Ambulatory Visit: Payer: Medicaid Other | Admitting: Advanced Practice Midwife

## 2023-02-14 VITALS — BP 136/83 | HR 89 | Wt 280.1 lb

## 2023-02-14 DIAGNOSIS — O09292 Supervision of pregnancy with other poor reproductive or obstetric history, second trimester: Secondary | ICD-10-CM

## 2023-02-14 DIAGNOSIS — O34219 Maternal care for unspecified type scar from previous cesarean delivery: Secondary | ICD-10-CM

## 2023-02-14 DIAGNOSIS — A528 Late syphilis, latent: Secondary | ICD-10-CM

## 2023-02-14 DIAGNOSIS — O10013 Pre-existing essential hypertension complicating pregnancy, third trimester: Secondary | ICD-10-CM

## 2023-02-14 DIAGNOSIS — O98112 Syphilis complicating pregnancy, second trimester: Secondary | ICD-10-CM

## 2023-02-14 DIAGNOSIS — Z8619 Personal history of other infectious and parasitic diseases: Secondary | ICD-10-CM

## 2023-02-14 DIAGNOSIS — O99342 Other mental disorders complicating pregnancy, second trimester: Secondary | ICD-10-CM

## 2023-02-14 DIAGNOSIS — O0990 Supervision of high risk pregnancy, unspecified, unspecified trimester: Secondary | ICD-10-CM

## 2023-02-14 DIAGNOSIS — Z98891 History of uterine scar from previous surgery: Secondary | ICD-10-CM

## 2023-02-14 DIAGNOSIS — F3289 Other specified depressive episodes: Secondary | ICD-10-CM

## 2023-02-14 DIAGNOSIS — O99212 Obesity complicating pregnancy, second trimester: Secondary | ICD-10-CM

## 2023-02-14 DIAGNOSIS — Z8759 Personal history of other complications of pregnancy, childbirth and the puerperium: Secondary | ICD-10-CM

## 2023-02-14 DIAGNOSIS — Z3A18 18 weeks gestation of pregnancy: Secondary | ICD-10-CM

## 2023-02-14 DIAGNOSIS — O10919 Unspecified pre-existing hypertension complicating pregnancy, unspecified trimester: Secondary | ICD-10-CM
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
## 2023-02-14 NOTE — Patient Instructions (Addendum)
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 You have been adequately treated with Bicillin 01/17/23, 01/24/23 and 01/31/23. It is very important to have your syphilis titers checked in 6, 12 and 24 months to make sure the treatment is working. If you are not treaed effectively in pregnancy, your baby is at high risk for complications.   Recommended testing dates: 6/25, 12/25 and 12/26  Increase you blood pressure medicine to 300 mg three times per day every 8 hours.

## 2023-02-14 NOTE — Progress Notes (Signed)
Pt reports being out of breath

## 2023-02-16 ENCOUNTER — Ambulatory Visit: Payer: Medicaid Other | Admitting: Physical Therapy

## 2023-02-23 ENCOUNTER — Ambulatory Visit: Payer: Medicaid Other

## 2023-02-23 DIAGNOSIS — M25572 Pain in left ankle and joints of left foot: Secondary | ICD-10-CM

## 2023-02-23 DIAGNOSIS — M6281 Muscle weakness (generalized): Secondary | ICD-10-CM

## 2023-02-23 DIAGNOSIS — R2689 Other abnormalities of gait and mobility: Secondary | ICD-10-CM
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
## 2023-02-24 NOTE — BH Specialist Note (Deleted)
 Integrated Behavioral Health via Telemedicine Visit  02/24/2023 Judy Wood [id number]  Number of Integrated Behavioral Health Clinician visits: No data recorded Session Start time: No data recorded  Session End time: No data recorded Total time in minutes: No data recorded  Referring Provider: *** Patient/Family location: *** [facility] Provider location: *** All persons participating in visit: *** Types of Service: {CHL AMB TYPE OF SERVICE:[phone]}  I connected with Judy Wood and/or Judy Wood's {family members:20773} via  Telephone or Video Enabled Telemedicine Application  (Video is Caregility application) and verified that I am speaking with the correct person using two identifiers. Discussed confidentiality: {YES/NO:21197}  I discussed the limitations of telemedicine and the availability of in person appointments.  Discussed there is a possibility of technology failure and discussed alternative modes of communication if that failure occurs.  I discussed that engaging in this telemedicine visit, they consent to the provision of behavioral healthcare and the services will be billed under their insurance.  Patient and/or legal guardian expressed understanding and consented to Telemedicine visit: {YES/NO:21197}  Presenting Concerns: Patient and/or family reports the following symptoms/concerns: *** Duration of problem: ***; Severity of problem: {Mild/Moderate/Severe:20260}  Patient and/or Family's Strengths/Protective Factors: {CHL AMB BH PROTECTIVE FACTORS:[phone]}  Goals Addressed: Patient will:  Reduce symptoms of: {IBH Symptoms:21014056}   Increase knowledge and/or ability of: {IBH Patient Tools:21014057}   Demonstrate ability to: {IBH Goals:21014053}  Progress towards Goals: {CHL AMB BH PROGRESS TOWARDS GOALS:[phone]}  Interventions: Interventions utilized:  {IBH Interventions:21014054} Standardized Assessments completed: {IBH Screening  Tools:21014051}  Patient and/or Family Response: ***  Assessment: Patient currently experiencing ***.   Patient may benefit from ***.  Plan: Follow up with behavioral health clinician on : *** Behavioral recommendations: *** Referral(s): {IBH Referrals:21014055}  I discussed the assessment and treatment plan with the patient and/or parent/guardian. They were provided an opportunity to ask questions and all were answered. They agreed with the plan and demonstrated an understanding of the instructions.   They were advised to call back or seek an in-person evaluation if the symptoms worsen or if the condition fails to improve as anticipated.  Valetta Close Ajanay Farve, LCSW

## 2023-02-28 ENCOUNTER — Ambulatory Visit: Payer: Medicaid Other

## 2023-02-28 DIAGNOSIS — M25572 Pain in left ankle and joints of left foot: Secondary | ICD-10-CM | POA: Diagnosis not present

## 2023-02-28 DIAGNOSIS — M6281 Muscle weakness (generalized): Secondary | ICD-10-CM

## 2023-02-28 DIAGNOSIS — R2689 Other abnormalities of gait and mobility: Secondary | ICD-10-CM
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
## 2023-03-02 ENCOUNTER — Ambulatory Visit: Payer: Medicaid Other | Attending: Student

## 2023-03-02 DIAGNOSIS — R2689 Other abnormalities of gait and mobility: Secondary | ICD-10-CM | POA: Insufficient documentation

## 2023-03-02 DIAGNOSIS — M25572 Pain in left ankle and joints of left foot: Secondary | ICD-10-CM | POA: Diagnosis present

## 2023-03-02 DIAGNOSIS — M6281 Muscle weakness (generalized): Secondary | ICD-10-CM | POA: Diagnosis present
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
## 2023-03-06 MED ORDER — FLUCONAZOLE 150 MG PO TABS: 150.0000 mg | ORAL_TABLET | Freq: Once | ORAL | 0 refills | Status: AC

## 2023-03-08 ENCOUNTER — Ambulatory Visit: Payer: Medicaid Other

## 2023-03-08 DIAGNOSIS — R2689 Other abnormalities of gait and mobility: Secondary | ICD-10-CM

## 2023-03-08 DIAGNOSIS — M25572 Pain in left ankle and joints of left foot: Secondary | ICD-10-CM

## 2023-03-08 DIAGNOSIS — M6281 Muscle weakness (generalized): Secondary | ICD-10-CM
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
## 2023-03-09 ENCOUNTER — Ambulatory Visit: Payer: Medicaid Other | Attending: Cardiology | Admitting: Cardiology

## 2023-03-10 ENCOUNTER — Telehealth: Payer: Self-pay

## 2023-03-10 ENCOUNTER — Ambulatory Visit: Payer: Medicaid Other

## 2023-03-14 ENCOUNTER — Ambulatory Visit (INDEPENDENT_AMBULATORY_CARE_PROVIDER_SITE_OTHER): Payer: Medicaid Other | Admitting: Obstetrics and Gynecology

## 2023-03-14 VITALS — BP 139/90 | HR 81 | Wt 279.0 lb

## 2023-03-14 DIAGNOSIS — Z8759 Personal history of other complications of pregnancy, childbirth and the puerperium: Secondary | ICD-10-CM

## 2023-03-14 DIAGNOSIS — A528 Late syphilis, latent: Secondary | ICD-10-CM

## 2023-03-14 DIAGNOSIS — O3662X0 Maternal care for excessive fetal growth, second trimester, not applicable or unspecified: Secondary | ICD-10-CM

## 2023-03-14 DIAGNOSIS — I10 Essential (primary) hypertension: Secondary | ICD-10-CM

## 2023-03-14 DIAGNOSIS — Z98891 History of uterine scar from previous surgery: Secondary | ICD-10-CM

## 2023-03-14 DIAGNOSIS — Z6841 Body Mass Index (BMI) 40.0 and over, adult: Secondary | ICD-10-CM

## 2023-03-14 DIAGNOSIS — Z3A22 22 weeks gestation of pregnancy: Secondary | ICD-10-CM

## 2023-03-14 DIAGNOSIS — O0990 Supervision of high risk pregnancy, unspecified, unspecified trimester: Secondary | ICD-10-CM

## 2023-03-14 DIAGNOSIS — O99210 Obesity complicating pregnancy, unspecified trimester: Secondary | ICD-10-CM

## 2023-03-14 DIAGNOSIS — Z5941 Food insecurity: Secondary | ICD-10-CM

## 2023-03-14 DIAGNOSIS — D271 Benign neoplasm of left ovary: Secondary | ICD-10-CM | POA: Insufficient documentation

## 2023-03-14 MED ORDER — MAG GLYCINATE 100 MG PO TABS: 1.0000 | ORAL_TABLET | Freq: Every day | ORAL | 2 refills | Status: DC | PRN

## 2023-03-14 MED ORDER — LABETALOL HCL 300 MG PO TABS: 300.0000 mg | ORAL_TABLET | Freq: Three times a day (TID) | ORAL | Status: DC
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
## 2023-03-15 LAB — POCT URINALYSIS DIP (DEVICE)
Bilirubin Urine: NEGATIVE
Glucose, UA: NEGATIVE mg/dL
Hgb urine dipstick: NEGATIVE
Ketones, ur: NEGATIVE mg/dL
Nitrite: NEGATIVE
Protein, ur: NEGATIVE mg/dL
Specific Gravity, Urine: >=1.03 (ref 1.005–1.030)
Urobilinogen, UA: 0.2 mg/dL (ref 0.0–1.0)
pH: 7 (ref 5.0–8.0)

## 2023-03-15 LAB — PROTEIN / CREATININE RATIO, URINE
Creatinine, Urine: 145.9 mg/dL
Protein, Ur: 20.9 mg/dL
Protein/Creat Ratio: 143 mg/g{creat} (ref 0–200)

## 2023-03-16 ENCOUNTER — Ambulatory Visit: Payer: Medicaid Other

## 2023-03-16 DIAGNOSIS — R2689 Other abnormalities of gait and mobility: Secondary | ICD-10-CM

## 2023-03-16 DIAGNOSIS — M6281 Muscle weakness (generalized): Secondary | ICD-10-CM

## 2023-03-16 DIAGNOSIS — M25572 Pain in left ankle and joints of left foot: Secondary | ICD-10-CM | POA: Diagnosis not present

## 2023-03-16 MED ORDER — HYDROCODONE-ACETAMINOPHEN 5-325 MG PO TABS: 1.0000 | ORAL_TABLET | Freq: Four times a day (QID) | ORAL | 0 refills | Status: DC | PRN
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
## 2023-03-17 ENCOUNTER — Encounter: Payer: Self-pay | Admitting: Obstetrics and Gynecology

## 2023-03-17 DIAGNOSIS — O10919 Unspecified pre-existing hypertension complicating pregnancy, unspecified trimester: Secondary | ICD-10-CM

## 2023-03-20 ENCOUNTER — Ambulatory Visit: Payer: Medicaid Other | Attending: Obstetrics and Gynecology

## 2023-03-20 ENCOUNTER — Other Ambulatory Visit: Payer: Self-pay

## 2023-03-20 ENCOUNTER — Inpatient Hospital Stay (HOSPITAL_COMMUNITY)
Admission: AD | Admit: 2023-03-20 | Discharge: 2023-03-20 | Disposition: A | Discharge status: Home / Self Care | Payer: Medicaid Other | Attending: Obstetrics and Gynecology | Admitting: Obstetrics and Gynecology

## 2023-03-20 ENCOUNTER — Encounter (HOSPITAL_COMMUNITY): Payer: Self-pay | Admitting: Obstetrics and Gynecology

## 2023-03-20 ENCOUNTER — Ambulatory Visit: Payer: Medicaid Other | Attending: Obstetrics & Gynecology | Admitting: Obstetrics and Gynecology

## 2023-03-20 ENCOUNTER — Ambulatory Visit: Payer: Medicaid Other | Admitting: *Deleted

## 2023-03-20 ENCOUNTER — Other Ambulatory Visit: Payer: Self-pay | Admitting: *Deleted

## 2023-03-20 VITALS — BP 176/108

## 2023-03-20 VITALS — BP 167/103 | HR 81

## 2023-03-20 DIAGNOSIS — O26892 Other specified pregnancy related conditions, second trimester: Secondary | ICD-10-CM | POA: Diagnosis not present

## 2023-03-20 DIAGNOSIS — O0990 Supervision of high risk pregnancy, unspecified, unspecified trimester: Secondary | ICD-10-CM

## 2023-03-20 DIAGNOSIS — O10919 Unspecified pre-existing hypertension complicating pregnancy, unspecified trimester: Secondary | ICD-10-CM

## 2023-03-20 DIAGNOSIS — E6689 Other obesity not elsewhere classified: Secondary | ICD-10-CM | POA: Diagnosis present

## 2023-03-20 DIAGNOSIS — Z3A23 23 weeks gestation of pregnancy: Secondary | ICD-10-CM | POA: Insufficient documentation

## 2023-03-20 DIAGNOSIS — O10912 Unspecified pre-existing hypertension complicating pregnancy, second trimester: Secondary | ICD-10-CM | POA: Insufficient documentation

## 2023-03-20 DIAGNOSIS — O99210 Obesity complicating pregnancy, unspecified trimester: Secondary | ICD-10-CM | POA: Diagnosis present

## 2023-03-20 DIAGNOSIS — E669 Obesity, unspecified: Secondary | ICD-10-CM

## 2023-03-20 DIAGNOSIS — Z98891 History of uterine scar from previous surgery: Secondary | ICD-10-CM | POA: Insufficient documentation

## 2023-03-20 DIAGNOSIS — O10012 Pre-existing essential hypertension complicating pregnancy, second trimester: Secondary | ICD-10-CM | POA: Diagnosis present

## 2023-03-20 DIAGNOSIS — O34219 Maternal care for unspecified type scar from previous cesarean delivery: Secondary | ICD-10-CM

## 2023-03-20 DIAGNOSIS — O162 Unspecified maternal hypertension, second trimester: Secondary | ICD-10-CM

## 2023-03-20 DIAGNOSIS — O0992 Supervision of high risk pregnancy, unspecified, second trimester: Secondary | ICD-10-CM | POA: Diagnosis not present

## 2023-03-20 DIAGNOSIS — O09292 Supervision of pregnancy with other poor reproductive or obstetric history, second trimester: Secondary | ICD-10-CM

## 2023-03-20 DIAGNOSIS — O99212 Obesity complicating pregnancy, second trimester: Secondary | ICD-10-CM

## 2023-03-20 LAB — CBC WITH DIFFERENTIAL/PLATELET
Abs Immature Granulocytes: 0.03 10*3/uL (ref 0.00–0.07)
Basophils Absolute: 0 10*3/uL (ref 0.0–0.1)
Basophils Relative: 0 %
Eosinophils Absolute: 0.2 10*3/uL (ref 0.0–0.5)
Eosinophils Relative: 3 %
HCT: 31.3 % — ABNORMAL LOW (ref 36.0–46.0)
Hemoglobin: 10.5 g/dL — ABNORMAL LOW (ref 12.0–15.0)
Immature Granulocytes: 0 %
Lymphocytes Relative: 23 %
Lymphs Abs: 1.8 10*3/uL (ref 0.7–4.0)
MCH: 30.6 pg (ref 26.0–34.0)
MCHC: 33.5 g/dL (ref 30.0–36.0)
MCV: 91.3 fL (ref 80.0–100.0)
Monocytes Absolute: 0.5 10*3/uL (ref 0.1–1.0)
Monocytes Relative: 6 %
Neutro Abs: 5.1 10*3/uL (ref 1.7–7.7)
Neutrophils Relative %: 68 %
Platelets: 321 10*3/uL (ref 150–400)
RBC: 3.43 MIL/uL — ABNORMAL LOW (ref 3.87–5.11)
RDW: 13.5 % (ref 11.5–15.5)
WBC: 7.6 10*3/uL (ref 4.0–10.5)
nRBC: 0 % (ref 0.0–0.2)

## 2023-03-20 LAB — URINALYSIS, ROUTINE W REFLEX MICROSCOPIC
Bilirubin Urine: NEGATIVE
Glucose, UA: NEGATIVE mg/dL
Hgb urine dipstick: NEGATIVE
Ketones, ur: NEGATIVE mg/dL
Leukocytes,Ua: NEGATIVE
Nitrite: NEGATIVE
Protein, ur: NEGATIVE mg/dL
Specific Gravity, Urine: 1.019 (ref 1.005–1.030)
pH: 7 (ref 5.0–8.0)

## 2023-03-20 LAB — COMPREHENSIVE METABOLIC PANEL
ALT: 10 U/L (ref 0–44)
AST: 13 U/L — ABNORMAL LOW (ref 15–41)
Albumin: 3.2 g/dL — ABNORMAL LOW (ref 3.5–5.0)
Alkaline Phosphatase: 52 U/L (ref 38–126)
Anion gap: 11 (ref 5–15)
BUN: <5 mg/dL — ABNORMAL LOW (ref 6–20)
CO2: 21 mmol/L — ABNORMAL LOW (ref 22–32)
Calcium: 9 mg/dL (ref 8.9–10.3)
Chloride: 102 mmol/L (ref 98–111)
Creatinine, Ser: 0.54 mg/dL (ref 0.44–1.00)
GFR, Estimated: >60 mL/min (ref >60)
Glucose, Bld: 77 mg/dL (ref 70–99)
Potassium: 3.7 mmol/L (ref 3.5–5.1)
Sodium: 134 mmol/L — ABNORMAL LOW (ref 135–145)
Total Bilirubin: 0.4 mg/dL (ref 0.0–1.2)
Total Protein: 6.8 g/dL (ref 6.5–8.1)

## 2023-03-20 LAB — PROTEIN / CREATININE RATIO, URINE
Creatinine, Urine: 148 mg/dL
Protein Creatinine Ratio: 0.16 mg/mg{creat} — ABNORMAL HIGH (ref 0.00–0.15)
Total Protein, Urine: 23 mg/dL

## 2023-03-20 MED ORDER — LABETALOL HCL 5 MG/ML IV SOLN: 40.0000 mg | INTRAVENOUS | Status: DC | PRN

## 2023-03-20 MED ORDER — NIFEDIPINE 10 MG PO CAPS
20.0000 mg | ORAL_CAPSULE | ORAL | Status: DC | PRN
  Administered 2023-03-20: 20 mg via ORAL
  Filled 2023-03-20 (×2): qty 2

## 2023-03-20 MED ORDER — LABETALOL HCL 100 MG PO TABS
400.0000 mg | ORAL_TABLET | Freq: Once | ORAL | Status: AC
  Administered 2023-03-20: 400 mg via ORAL
  Filled 2023-03-20: qty 4

## 2023-03-20 MED ORDER — NIFEDIPINE 10 MG PO CAPS
10.0000 mg | ORAL_CAPSULE | ORAL | Status: DC | PRN
  Administered 2023-03-20: 10 mg via ORAL
  Filled 2023-03-20 (×2): qty 1

## 2023-03-20 MED ORDER — LABETALOL HCL 300 MG PO TABS: 450.0000 mg | ORAL_TABLET | Freq: Three times a day (TID) | ORAL | 4 refills | Status: DC

## 2023-03-20 MED ORDER — NIFEDIPINE 10 MG PO CAPS
20.0000 mg | ORAL_CAPSULE | ORAL | Status: DC | PRN
  Administered 2023-03-20: 20 mg via ORAL

## 2023-03-20 MED ORDER — ACETAMINOPHEN 500 MG PO TABS
1000.0000 mg | ORAL_TABLET | Freq: Once | ORAL | Status: AC
  Administered 2023-03-20: 1000 mg via ORAL
  Filled 2023-03-20: qty 2

## 2023-03-20 MED ORDER — ACETAMINOPHEN 325 MG PO TABS: 650.0000 mg | ORAL_TABLET | Freq: Four times a day (QID) | ORAL | Status: DC | PRN

## 2023-03-20 MED ORDER — CYCLOBENZAPRINE HCL 5 MG PO TABS
10.0000 mg | ORAL_TABLET | Freq: Once | ORAL | Status: AC
  Administered 2023-03-20: 10 mg via ORAL
  Filled 2023-03-20: qty 2

## 2023-03-20 NOTE — Progress Notes (Signed)
Maternal-Fetal Medicine   Name: Judy Wood DOB: 44/05/1995 MRN: [id number] Referring Provider: Lamont Snowball, CNM  I had the pleasure of [facility] at [facility]. She is G3 P2002 R 23w 2d gestation and is here for completion of fetal anatomical survey. She has chronic hypertension and takes labetalol 300 mg with breakfast and with lunch. Her blood pressures today at our office were 167/103 and 176/108 mmHg.  She complains of mild headaches.  Patient takes low-dose aspirin prophylaxis.  Ultrasound Fetal growth is appropriate for gestational age.  Amniotic fluid normal good fetal activity seen.  Fetal anatomical survey was completed and appears normal. As maternal obesity imposes limitations on the resolution of images, fetal anomalies may be missed.  Chronic hypertension I discussed severe range hypertension that can be associated with maternal adverse outcomes including stroke, cardiac failure, pulmonary edema, coagulation disturbances and placental abruption.  I counseled her that labetalol should be taken once in the morning and and once in the evening.  Antihypertensive dosage may have to be adjusted. I recommended evaluation at the MAU with series of blood pressures and labs.  Patient agreed to go to the MAU.  Recommendations -MAU evaluation. -An appointment was made for her to return in 5 weeks for fetal growth assessment.   Thank you for consultation.  If you have any questions or concerns, please contact me the [facility].  Consultation including face-to-face (more than 50%) counseling 20 minutes.

## 2023-03-20 NOTE — Progress Notes (Unsigned)
Maternal-Fetal Medicine   Name: Judy Wood DOB: 44/05/1995 MRN: [id number] Referring Provider: Lamont Snowball, CNM  I had the pleasure of seeing Judy Wood today at the [facility]. She is G3 P2002 R 23w 2d gestation and is here for completion of fetal anatomical survey. She has chronic hypertension and takes labetalol 300 mg with breakfast and with lunch. Her blood pressures today at our office were 167/103 and 176/108 mmHg.  She complains of mild headaches.  Patient takes low-dose aspirin prophylaxis.  Ultrasound Fetal growth is appropriate for gestational age.  Amniotic fluid normal good fetal activity seen.  Fetal anatomical survey was completed and appears normal. As maternal obesity imposes limitations on the resolution of images, fetal anomalies may be missed.  Chronic hypertension I discussed severe range hypertension that can be associated with maternal adverse outcomes including stroke, cardiac failure, pulmonary edema, coagulation disturbances and placental abruption.  I counseled her that labetalol should be taken once in the morning and and once in the evening.  Antihypertensive dosage may have to be adjusted. I recommended evaluation at the MAU with series of blood pressures and labs.  Patient agreed to go to the MAU.  Recommendations -MAU evaluation. -An appointment was made for her to return in 5 weeks for fetal growth assessment.   Thank you for consultation.  If you have any questions or concerns, please contact me the [facility].  Consultation including face-to-face (more than 50%) counseling 20 minutes.

## 2023-03-20 NOTE — MAU Note (Signed)
Per provider will hold on iv order for now

## 2023-03-20 NOTE — MAU Note (Signed)
Judy Wood is a 28 y.o. at [redacted]w[redacted]d here in MAU reporting: she was sent MAU from [facility] for BP evaluation, states she was told her BP was too high.  Denies visual disturbances and epigastric pain, endorses "slight" H/A.  Denies VB or LOF.  Reports +FM.  LMP: NA Onset of complaint: today Pain score: 5 Vitals:   03/20/23 1146  BP: (!) 185/119  Pulse: 90  Resp: 18  Temp: 97.8 F (36.6 C)  SpO2: 100%     FHT: 149 bpm  Lab orders placed from triage: None

## 2023-03-20 NOTE — Discharge Instructions (Addendum)
You were seen today for severe range blood pressure readings at [facility]. We were able to get your blood pressures stabilized with medications. We decided to increase the dosage of your medication to better control your blood pressures. It is now labetalol 450mg  three times daily. You should take your first dose tonight. All you lab testing was reassuring.   Please keep all scheduled appointments. Thank you for trusting [place] to care for you, Huntley Dec, Midwife

## 2023-03-20 NOTE — MAU Provider Note (Signed)
Chief Complaint:  BP Evaluation   HPI   None     Judy Wood is a 28 y.o. G3P2002 at [redacted]w[redacted]d who presents to maternity admissions after being sent from MFM for severe range blood pressures. She is a known hypertensive. BP readings there: 167/103 176/108  Reports mild headache, declines medication. No other symptoms.   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   Fracture of right proximal fibula 10/06/2022   H/O umbilical hernia repair    Hernia, umbilical    Hypertension    on meds   Hypertension    Obesity    Open dislocation of left talus 10/06/2022   Syphilis 08/30/2014   Treated   OB History  Gravida Para Term Preterm AB Living  3 2 2  0 0 2  SAB IAB Ectopic Multiple Live Births  0 0 0 0 2    # Outcome Date GA Lbr Len/2nd Weight Sex Type Anes PTL Lv  3 Current           2 Term 10/13/14 [redacted]w[redacted]d 07:45 / 00:06 2534 g M Vag-Spont EPI  LIV  1 Term 05/19/12 [redacted]w[redacted]d  2889 g F CS-LTranv EPI  LIV   Past Surgical History:  Procedure Laterality Date   CESAREAN SECTION N/A 05/19/2012   Procedure:  Primary CESAREAN SECTION  of baby girl  at 1933  APGAR 4/5/5;  Surgeon: Adam Phenix, MD;  Location: [facility];  Service: Obstetrics;  Laterality: N/A;   EXTERNAL FIXATION REMOVAL Left 09/30/2022   Procedure: REMOVAL EXTERNAL FIXATION LEG;  Surgeon: Roby Lofts, MD;  Location: [facility];  Service: Orthopedics;  Laterality: Left;   I & D EXTREMITY Left 09/28/2022   Procedure: IRRIGATION AND DEBRIDEMENT EXTREMITY;  Surgeon: Roby Lofts, MD;  Location: [facility];  Service: Orthopedics;  Laterality: Left;   OPEN REDUCTION INTERNAL FIXATION (ORIF) FOOT LISFRANC FRACTURE Left 09/28/2022   Procedure: External fixation of left ankle;  Surgeon: Roby Lofts, MD;  Location: [facility];  Service: Orthopedics;  Laterality: Left;   ORIF CALCANEOUS  FRACTURE Left 09/30/2022   Procedure: OPEN REDUCTION INTERNAL FIXATION (ORIF) TALUS FRACTURE;  Surgeon: Roby Lofts, MD;  Location: [facility];  Service: Orthopedics;  Laterality: Left;   UMBILICAL HERNIA REPAIR     as a child   UMBILICAL HERNIA REPAIR     Family History  Problem Relation Age of Onset   Hypertension Mother    Anemia Mother    Depression Mother    Deep vein thrombosis Maternal Grandmother    Diabetes Maternal Grandmother    Kidney disease Maternal Grandmother    Cancer Maternal Grandmother        colon cancer   Diabetes Father    Social History   Tobacco Use   Smoking status: Some Days    Current packs/day: 0.50    Types: Cigarettes   Smokeless tobacco: Never  Vaping Use   Vaping status: Never Used  Substance Use Topics   Alcohol use: Not Currently   Drug use: Not Currently    Types: Marijuana, Other-see comments    Comment: Stopped when she found out she was pregnant    Allergies  Allergen Reactions   Banana Anaphylaxis   Lactose Intolerance (Gi) Diarrhea and Nausea Only   Sulfa Antibiotics Other (See  Comments)    Childhood reaction   Medications Prior to Admission  Medication Sig Dispense Refill Last Dose/Taking   aspirin EC 81 MG tablet Take 2 tablets (162 mg total) by mouth at bedtime. Start taking when you are [redacted] weeks pregnant for rest of pregnancy for prevention of preeclampsia 300 tablet 2 03/20/2023   emtricitabine-tenofovir (TRUVADA) 200-300 MG tablet Take 1 tablet by mouth daily. 90 tablet 3 03/20/2023   Prenatal 28-0.8 MG TABS Take 1 tablet by mouth daily. 30 tablet 12 03/20/2023   [DISCONTINUED] labetalol (NORMODYNE) 300 MG tablet Take 1 tablet (300 mg total) by mouth 3 (three) times daily.   03/20/2023   Blood Pressure Monitoring (BLOOD PRESSURE KIT) DEVI 1 Device by Does not apply route daily. (Patient not taking: Reported on 03/14/2023) 1 each 0    Cholecalciferol 25 MCG (1000 UT) tablet Take 2 tablets (2,000 Units total) by mouth daily at 2 PM.  (Patient not taking: Reported on 03/14/2023) 180 tablet 0    HYDROcodone-acetaminophen (NORCO) 5-325 MG tablet Take 1-1.5 tablets by mouth every 6 (six) hours as needed for moderate pain (pain score 4-6). 120 tablet 0    methocarbamol (ROBAXIN) 500 MG tablet Take 1 tablet (500 mg total) by mouth 4 (four) times daily. (Patient not taking: Reported on 03/14/2023) 120 tablet 1    prazosin (MINIPRESS) 1 MG capsule Take 4 capsules (4 mg total) by mouth at bedtime. (Patient not taking: Reported on 03/14/2023) 120 capsule 1    [DISCONTINUED] acetaminophen (TYLENOL) 325 MG tablet Take 1 tablet (325 mg total) by mouth every 6 (six) hours as needed for mild pain.       I have reviewed patient's Past Medical Hx, Surgical Hx, Family Hx, Social Hx, medications and allergies.   ROS  Pertinent items noted in HPI and remainder of comprehensive ROS otherwise negative.   PHYSICAL EXAM  Patient Vitals for the past 24 hrs:  BP Temp Temp src Pulse Resp SpO2 Height Weight  03/20/23 1530 126/66 -- -- 88 -- 98 % -- --  03/20/23 1442 (!) 159/80 -- -- (!) 103 -- -- -- --  03/20/23 1440 (!) 159/87 -- -- (!) 107 -- 97 % -- --  03/20/23 1435 -- -- -- -- -- 97 % -- --  03/20/23 1430 -- -- -- -- -- 99 % -- --  03/20/23 1415 -- -- -- -- -- 100 % -- --  03/20/23 1410 -- -- -- -- -- 99 % -- --  03/20/23 1405 -- -- -- -- -- 100 % -- --  03/20/23 1401 (!) 149/81 -- -- (!) 106 -- -- -- --  03/20/23 1400 -- -- -- -- -- 99 % -- --  03/20/23 1355 -- -- -- -- -- 100 % -- --  03/20/23 1350 -- -- -- -- -- 99 % -- --  03/20/23 1345 (!) 145/94 -- -- (!) 107 -- 99 % -- --  03/20/23 1320 -- -- -- -- -- 99 % -- --  03/20/23 1315 (!) 162/100 -- -- 96 -- 99 % -- --  03/20/23 1305 -- -- -- -- -- 100 % -- --  03/20/23 1300 -- -- -- -- -- 100 % -- --  03/20/23 1255 -- -- -- -- -- 100 % -- --  03/20/23 1251 (!) 141/82 -- -- 80 -- -- -- --  03/20/23 1250 -- -- -- -- -- 100 % -- --  03/20/23 1245 -- -- -- -- -- 100 % -- --  03/20/23  1235  -- -- -- -- -- 100 % -- --  03/20/23 1230 -- -- -- -- -- 100 % -- --  03/20/23 1225 -- -- -- -- -- 100 % -- --  03/20/23 1223 (!) 166/101 -- -- 86 -- -- -- --  03/20/23 1220 -- -- -- -- -- 100 % -- --  03/20/23 1215 -- -- -- -- -- 100 % -- --  03/20/23 1210 -- -- -- -- -- 100 % -- --  03/20/23 1205 -- -- -- -- -- 100 % -- --  03/20/23 1203 (!) 189/116 -- -- 93 -- -- -- --  03/20/23 1146 (!) 185/119 97.8 F (36.6 C) Oral 90 18 100 % -- --  03/20/23 1140 -- -- -- -- -- -- 5\' 4"  (1.626 m) 125.9 kg    Physical Exam Constitutional:      General: She is not in acute distress.    Appearance: Normal appearance. She is obese. She is not ill-appearing, toxic-appearing or diaphoretic.  HENT:     Head: Normocephalic.  Cardiovascular:     Rate and Rhythm: Normal rate.  Pulmonary:     Effort: Pulmonary effort is normal.  Musculoskeletal:        General: Normal range of motion.  Skin:    General: Skin is warm and dry.  Neurological:     General: No focal deficit present.     Mental Status: She is alert and oriented to person, place, and time.  Psychiatric:        Mood and Affect: Mood normal.        Behavior: Behavior normal.        Thought Content: Thought content normal.        Judgment: Judgment normal.      Fetal Tracing: Baseline: 140 Variability: moderate Accelerations: 10x 10 Decelerations: none noted Toco: quiet   Labs: Results for orders placed or performed during the hospital encounter of 03/20/23 (from the past 24 hours)  Protein / creatinine ratio, urine     Status: Abnormal   Collection Time: 03/20/23 11:52 AM  Result Value Ref Range   Creatinine, Urine 148 mg/dL   Total Protein, Urine 23 mg/dL   Protein Creatinine Ratio 0.16 (H) 0.00 - 0.15 mg/mg[Cre]  Urinalysis, Routine w reflex microscopic -Urine, Clean Catch     Status: Abnormal   Collection Time: 03/20/23 11:52 AM  Result Value Ref Range   Color, Urine AMBER (A) YELLOW   APPearance CLOUDY (A) CLEAR    Specific Gravity, Urine 1.019 1.005 - 1.030   pH 7.0 5.0 - 8.0   Glucose, UA NEGATIVE NEGATIVE mg/dL   Hgb urine dipstick NEGATIVE NEGATIVE   Bilirubin Urine NEGATIVE NEGATIVE   Ketones, ur NEGATIVE NEGATIVE mg/dL   Protein, ur NEGATIVE NEGATIVE mg/dL   Nitrite NEGATIVE NEGATIVE   Leukocytes,Ua NEGATIVE NEGATIVE  Comprehensive metabolic panel     Status: Abnormal   Collection Time: 03/20/23 12:20 PM  Result Value Ref Range   Sodium 134 (L) 135 - 145 mmol/L   Potassium 3.7 3.5 - 5.1 mmol/L   Chloride 102 98 - 111 mmol/L   CO2 21 (L) 22 - 32 mmol/L   Glucose, Bld 77 70 - 99 mg/dL   BUN <5 (L) 6 - 20 mg/dL   Creatinine, Ser 9.62 0.44 - 1.00 mg/dL   Calcium 9.0 8.9 - 95.2 mg/dL   Total Protein 6.8 6.5 - 8.1 g/dL   Albumin 3.2 (L) 3.5 - 5.0 g/dL   AST  13 (L) 15 - 41 U/L   ALT 10 0 - 44 U/L   Alkaline Phosphatase 52 38 - 126 U/L   Total Bilirubin 0.4 0.0 - 1.2 mg/dL   GFR, Estimated >16 >10 mL/min   Anion gap 11 5 - 15  CBC with Differential/Platelet     Status: Abnormal   Collection Time: 03/20/23 12:20 PM  Result Value Ref Range   WBC 7.6 4.0 - 10.5 K/uL   RBC 3.43 (L) 3.87 - 5.11 MIL/uL   Hemoglobin 10.5 (L) 12.0 - 15.0 g/dL   HCT 96.0 (L) 45.4 - 09.8 %   MCV 91.3 80.0 - 100.0 fL   MCH 30.6 26.0 - 34.0 pg   MCHC 33.5 30.0 - 36.0 g/dL   RDW 11.9 14.7 - 82.9 %   Platelets 321 150 - 400 K/uL   nRBC 0.0 0.0 - 0.2 %   Neutrophils Relative % 68 %   Neutro Abs 5.1 1.7 - 7.7 K/uL   Lymphocytes Relative 23 %   Lymphs Abs 1.8 0.7 - 4.0 K/uL   Monocytes Relative 6 %   Monocytes Absolute 0.5 0.1 - 1.0 K/uL   Eosinophils Relative 3 %   Eosinophils Absolute 0.2 0.0 - 0.5 K/uL   Basophils Relative 0 %   Basophils Absolute 0.0 0.0 - 0.1 K/uL   Immature Granulocytes 0 %   Abs Immature Granulocytes 0.03 0.00 - 0.07 K/uL    Imaging:  [place] [facility]FM OB FOLLOW UP Result Date: 03/20/2023 ----------------------------------------------------------------------  OBSTETRICS REPORT                        (Signed Final 03/20/2023 11:08 am) ---------------------------------------------------------------------- Patient Info  ID #:       [id number]                          D.O.B.:  Oct 17, 1995 (27 yrs)(F)  Name:       Denton Brick Stennis                   Visit Date: 03/20/2023 09:41 am ---------------------------------------------------------------------- Performed By  Attending:        Noralee Space MD        Ref. Address:     [address]                                                             [place], [place]                                                             [address]  Performed By:     Emeline Darling BS,      [facility]  Referred By:      [facility] ---------------------------------------------------------------------- Orders  #  Description                           Code        Ordered By  1  [place] MFM OB FOLLOW UP                   [phone]    Noralee Space ----------------------------------------------------------------------  #  Order #                     Accession #                Episode #  1  [id number]                   [id number]                 [id number] ---------------------------------------------------------------------- Indications  Pre-existing essential hypertension            O10.012  complicating pregnancy, second trimester  Obesity complicating pregnancy, second         O99.212  trimester (BMI 48)  History of cesarean delivery, currently        O34.219  pregnant  Poor obstetrical history (Placental abruption  O09.299  37 weeks)  [redacted] weeks gestation of pregnancy                Z3A.23  LR NIPS, Neg Horizon ---------------------------------------------------------------------- Vital Signs  BP:          176/108  ---------------------------------------------------------------------- Fetal Evaluation  Num Of Fetuses:         1  Fetal Heart Rate(bpm):  138  Cardiac Activity:       Observed  Presentation:           Breech  Placenta:               Anterior  P. Cord Insertion:      Previously seen  Amniotic Fluid  AFI FV:      Within normal limits                              Largest Pocket(cm)                              5.2 ---------------------------------------------------------------------- Biometry  BPD:      59.5  mm     G. Age:  24w 2d  76  %    CI:        71.65   %    70 - 86                                                          FL/HC:      19.1   %    19.2 - 20.8  HC:      223.8  mm     G. Age:  24w 3d         72  %    HC/AC:      1.13        1.05 - 1.21  AC:      198.1  mm     G. Age:  24w 3d         74  %    FL/BPD:     71.8   %    71 - 87  FL:       42.7  mm     G. Age:  24w 0d         56  %    FL/AC:      21.6   %    20 - 24  Est. FW:     678  gm      1 lb 8 oz     81  % ---------------------------------------------------------------------- OB History  Gravidity:    3         Term:   2  Living:       2 ---------------------------------------------------------------------- Gestational Age  U/S Today:     24w 2d                                        EDD:   07/08/23  Best:          23w 3d     Det. By:  U/S C R L  (11/21/22)    EDD:   07/14/23 ---------------------------------------------------------------------- Targeted Anatomy  Central Nervous System  Calvarium/Cranial V.:  Appears normal         Cereb./Vermis:          Previously seen  Cavum:                 Appears normal         Cisterna Magna:         Previously seen  Lateral Ventricles:    Appears normal         Midline Falx:           Previously seen  Choroid Plexus:        Previously seen  Spine  Cervical:              Previously seen        Sacral:                 Previously seen  Thoracic:              Previously seen        Shape/Curvature:         Previously seen  Lumbar:  Previously seen  Head/Neck  Lips:                  Previously seen        Profile:                Previously seen  Neck:                  Previously seen        Orbits/Eyes:            Previously seen  Nuchal Fold:           Not applicable         Mandible:               Previously seen  Nasal Bone:            Previously seen        Maxilla:                Previously seen  Thorax  4 Chamber View:        Appears normal         SVC:                    Previously seen  Cardiac Activity:      Observed               Interventr. Septum:     Appears normal  Cardiac Rhythm:        Normal                 Cardiac Axis:           Normal  Cardiac Situs:         Appears normal         Diaphragm:              Appears normal  Rt Outflow Tract:      Appears normal         3 Vessel View:          Appears normal  Lt Outflow Tract:      Appears normal         3 V Trachea View:       Appears normal  Aortic Arch:           Previously seen        IVC:                    Previously seen  Ductal Arch:           Previously seen        Crossing:               Previously seen  Abdomen  Ventral Wall:          Previously seen        Lt Kidney:              Appears normal  Cord Insertion:        Previously seen        Rt Kidney:              Appears normal  Situs:                 Appears normal         Bladder:                Appears normal  Stomach:  Appears normal  Extremities  Lt Humerus:            Previously seen        Lt Femur:               Previously seen  Rt Humerus:            Previously seen        Rt Femur:               Previously seen  Lt Forearm:            Previously seen        Lt Lower Leg:           Previously seen  Rt Forearm:            Previously seen        Rt Lower Leg:           Previously seen  Lt Hand:               Open hand nml          Lt Foot:                Previously seen  Rt Hand:               Open hand nml          Rt Foot:                Previously seen   Other  Umbilical Cord:        Previously seen        Genitalia:              Female-nml  Comment:     Technically difficult due to maternal habitus and fetal position. ---------------------------------------------------------------------- Cervix Uterus Adnexa  Cervix  Not visualized (advanced GA >24wks)  Adnexa  No abnormality visualized ---------------------------------------------------------------------- Impression  Fetal growth is appropriate for gestational age.  Amniotic fluid  normal good fetal activity seen.  Fetal anatomical survey was  completed and appears normal.  As maternal obesity imposes limitations on the resolution of  images, fetal anomalies may be missed.  xxxxxxxxxxxxxxxxxxxxxxxxxxxxxxxxxxxxxxxxxxxxxx  Consultation  I had the pleasure of [facility]. Degan [facility]. She is G3 P2002 R 23w 2d gestation  and is here for completion of fetal anatomical survey.  She has chronic hypertension and takes labetalol 300 mg  with breakfast and with lunch.  Her blood pressures today at our office were 167/103 and  176/108 mmHg.  She complains of mild headaches.  Patient  takes low-dose aspirin prophylaxis.  Chronic hypertension  I discussed severe range hypertension that can be  associated with maternal adverse outcomes including stroke,  cardiac failure, pulmonary edema, coagulation disturbances  and placental abruption.  I counseled her that labetalol should  be taken once in the morning and and once in the evening.  Antihypertensive dosage may have to be adjusted.  I recommended evaluation at the MAU with series of blood  pressures and labs.  Patient agreed to go to the MAU. ---------------------------------------------------------------------- Recommendations  -MAU evaluation.  -An appointment was made for her to return in 5 weeks for  fetal growth assessment. ----------------------------------------------------------------------                 Noralee Space, MD  Electronically Signed Final Report   03/20/2023 11:08 am ----------------------------------------------------------------------    MDM & MAU COURSE  MDM: See below  MAU Course: Orders Placed This Encounter  Procedures   Comprehensive metabolic panel   CBC with Differential/Platelet   Protein / creatinine ratio, urine   Urinalysis, Routine w reflex microscopic -Urine, Clean Catch   Notify physician (specify) Confirmatory reading of BP> 160/110 15 minutes later   Apply Hypertensive Disorders of Pregnancy Care Plan   Vital signs   Measure blood pressure   Insert peripheral IV   Discharge patient   Meds ordered this encounter  Medications   AND Linked Order Group    NIFEdipine (PROCARDIA) capsule 10 mg    NIFEdipine (PROCARDIA) capsule 20 mg    NIFEdipine (PROCARDIA) capsule 20 mg   DISCONTD: labetalol (NORMODYNE) injection 40 mg   acetaminophen (TYLENOL) tablet 1,000 mg   labetalol (NORMODYNE) tablet 400 mg   acetaminophen (TYLENOL) 325 MG tablet    Sig: Take 2 tablets (650 mg total) by mouth every 6 (six) hours as needed for mild pain (pain score 1-3).    Supervising Provider:   Reva Bores [[id number]]   labetalol (NORMODYNE) 300 MG tablet    Sig: Take 1.5 tablets (450 mg total) by mouth 3 (three) times daily.    Dispense:  135 tablet    Refill:  4    Supervising Provider:   Reva Bores [[id number]]   cyclobenzaprine (FLEXERIL) tablet 10 mg    ASSESSMENT   1. Preexisting hypertension complicating pregnancy, antepartum   2. Hypertension affecting pregnancy in second trimester   3. Maternal morbid obesity, antepartum (HCC)   4. Supervision of high risk pregnancy, antepartum   5. History of cesarean section followed by vaginal birth (VBAC)    Labs benign. Improvement to mild range and normotensive blood pressures with   PLAN  - Increase dosage of labetalol to 450mg  TID. - Home monitoring of BP.   Discharge home in stable condition.  Preterm precautions advised.      Allergies as of 03/20/2023       Reactions   Banana Anaphylaxis   Lactose Intolerance (gi) Diarrhea, Nausea Only   Sulfa Antibiotics Other (See Comments)   Childhood reaction        Medication List     TAKE these medications    acetaminophen 325 MG tablet Commonly known as: TYLENOL Take 2 tablets (650 mg total) by mouth every 6 (six) hours as needed for mild pain (pain score 1-3). What changed: how much to take   aspirin EC 81 MG tablet Take 2 tablets (162 mg total) by mouth at bedtime. Start taking when you are [redacted] weeks pregnant for rest of pregnancy for prevention of preeclampsia   Blood Pressure Kit Devi 1 Device by Does not apply route daily.   emtricitabine-tenofovir 200-300 MG tablet Commonly known as: Truvada Take 1 tablet by mouth daily.   HYDROcodone-acetaminophen 5-325 MG tablet Commonly known as: Norco Take 1-1.5 tablets by mouth every 6 (six) hours as needed for moderate pain (pain score 4-6).   labetalol 300 MG tablet Commonly known as: NORMODYNE Take 1.5 tablets (450 mg total) by mouth 3 (three) times daily. What changed: how much to take   methocarbamol 500 MG tablet Commonly known as: ROBAXIN Take 1 tablet (500 mg total) by mouth 4 (four) times daily.   prazosin 1 MG capsule Commonly known as: MINIPRESS Take 4 capsules (4 mg total) by mouth at bedtime.   Prenatal 28-0.8 MG Tabs Take 1 tablet by mouth daily.   vitamin D3 25 MCG tablet Commonly known  as: CHOLECALCIFEROL Take 2 tablets (2,000 Units total) by mouth daily at 2 PM.        Lamont Snowball, MSN, CNM 03/20/2023 4:07 PM  Certified Nurse Midwife, [facility]

## 2023-03-23 ENCOUNTER — Ambulatory Visit: Payer: Medicaid Other

## 2023-03-23 NOTE — Telephone Encounter (Signed)
 error

## 2023-03-25 MED ORDER — LABETALOL HCL 300 MG PO TABS: 300.0000 mg | ORAL_TABLET | Freq: Three times a day (TID) | ORAL | 0 refills | Status: DC

## 2023-03-25 NOTE — Telephone Encounter (Signed)
    Faculty Practice OB/GYN Physician Babyscripts Phone Call Documentation  There was a Babyscripts notification of elevated BP of 159/102  for Judy Wood who is [redacted]w[redacted]d with known CHTN.  I called Mery D Shaffer at home, and after verifying two patient identifiers, asked about this BP.  She had a severe range BP of 180/128 earlier today, she took her Labetalol and took a nap and the 159/102 is her recheck prior to taking her Labetalol at night time.  Reports mild 1-2/10 headache, denies any visual symptoms, RUQ/epigastric pain or other concerning symptoms. Reports good FM, denies VB, LOF or contractions.  Advised to increase dosage of Labetalol from 450 mg to 600 mg po tid for tonight's dose, will recheck BP 2 hours after taking medication. If still very elevated or if symptoms worsen, she was told to come to MAU for further evaluation.   Will continue to monitor closely.   Jaynie Collins, MD, FACOG Obstetrician & Gynecologist, [facility] for [employer], [facility]

## 2023-03-27 ENCOUNTER — Telehealth: Payer: Self-pay

## 2023-03-27 NOTE — Telephone Encounter (Signed)
[place], Babyscripts  P [facility]-Babyscripts Htn Md; P [facility] Clinical Pool Babyscripts Trigger Notification for Ruffolo, Miaisabella Trigger Severity: Critical Blood Pressure Value: 180/128 Reading Date: 2023-03-25 Symptoms: Persistent Headache, Shortness Of Breath  [place], Babyscripts  P [facility]-Babyscripts Htn Md; P [facility] Clinical Pool Babyscripts Trigger Notification for Pitstick, Lauryl Trigger Severity: Critical Blood Pressure Value: 159/102 Reading Date: 2023-03-26 Symptoms: Persistent Headache   Called pt to review 2 elevated BP over the weekend. Pt reports no headache, vision change, shortness of breath, or dizziness today. Has not rechecked BP since yesterday. Does report she took labetalol since elevated BP was recorded. Cannot recheck until father arrives home. He should be home by 5:15 PM. Reviewed if this is severe range BP of 160/100 or higher she should be seen at MAU or if she is having any symptoms and BP is 140/90 or higher she should be seen at MAU. Called pt at 5:15 as planned; no answer, so VM left. Will also send MyChart message.

## 2023-03-30 ENCOUNTER — Telehealth: Payer: Self-pay

## 2023-03-30 ENCOUNTER — Ambulatory Visit: Payer: Medicaid Other

## 2023-03-30 NOTE — Telephone Encounter (Signed)
Spoke with Patient. She verbalizes understanding of discharge d/t attendance. She plans to update her lawyer. Understands need for new referral if desired to return to skilled PT. - MJ

## 2023-04-03 ENCOUNTER — Encounter: Payer: Medicaid Other | Admitting: Obstetrics and Gynecology

## 2023-04-08 ENCOUNTER — Encounter: Payer: Self-pay | Admitting: Obstetrics and Gynecology

## 2023-04-09 ENCOUNTER — Encounter: Payer: Self-pay | Admitting: Obstetrics and Gynecology

## 2023-04-10 ENCOUNTER — Telehealth: Payer: Self-pay | Admitting: *Deleted

## 2023-04-10 ENCOUNTER — Inpatient Hospital Stay (HOSPITAL_COMMUNITY)
Admission: AD | Admit: 2023-04-10 | Discharge: 2023-04-10 | Disposition: A | Discharge status: Home / Self Care | Payer: Medicaid Other | Attending: Obstetrics & Gynecology | Admitting: Obstetrics & Gynecology

## 2023-04-10 ENCOUNTER — Encounter (HOSPITAL_COMMUNITY): Payer: Self-pay | Admitting: Obstetrics & Gynecology

## 2023-04-10 DIAGNOSIS — O9852 Other viral diseases complicating childbirth: Secondary | ICD-10-CM | POA: Diagnosis not present

## 2023-04-10 DIAGNOSIS — Z3A26 26 weeks gestation of pregnancy: Secondary | ICD-10-CM | POA: Insufficient documentation

## 2023-04-10 DIAGNOSIS — J988 Other specified respiratory disorders: Secondary | ICD-10-CM | POA: Insufficient documentation

## 2023-04-10 DIAGNOSIS — O26892 Other specified pregnancy related conditions, second trimester: Secondary | ICD-10-CM | POA: Diagnosis not present

## 2023-04-10 DIAGNOSIS — R519 Headache, unspecified: Secondary | ICD-10-CM | POA: Diagnosis not present

## 2023-04-10 DIAGNOSIS — O10912 Unspecified pre-existing hypertension complicating pregnancy, second trimester: Secondary | ICD-10-CM | POA: Diagnosis not present

## 2023-04-10 DIAGNOSIS — U071 COVID-19: Secondary | ICD-10-CM | POA: Insufficient documentation

## 2023-04-10 DIAGNOSIS — Z8709 Personal history of other diseases of the respiratory system: Secondary | ICD-10-CM | POA: Diagnosis not present

## 2023-04-10 DIAGNOSIS — O98512 Other viral diseases complicating pregnancy, second trimester: Secondary | ICD-10-CM | POA: Diagnosis present

## 2023-04-10 DIAGNOSIS — R0600 Dyspnea, unspecified: Secondary | ICD-10-CM | POA: Diagnosis not present

## 2023-04-10 DIAGNOSIS — O99512 Diseases of the respiratory system complicating pregnancy, second trimester: Secondary | ICD-10-CM | POA: Insufficient documentation

## 2023-04-10 DIAGNOSIS — O10919 Unspecified pre-existing hypertension complicating pregnancy, unspecified trimester: Secondary | ICD-10-CM

## 2023-04-10 LAB — COMPREHENSIVE METABOLIC PANEL
ALT: 15 U/L (ref 0–44)
AST: 19 U/L (ref 15–41)
Albumin: 3.1 g/dL — ABNORMAL LOW (ref 3.5–5.0)
Alkaline Phosphatase: 61 U/L (ref 38–126)
Anion gap: 10 (ref 5–15)
BUN: <5 mg/dL — ABNORMAL LOW (ref 6–20)
CO2: 22 mmol/L (ref 22–32)
Calcium: 9 mg/dL (ref 8.9–10.3)
Chloride: 104 mmol/L (ref 98–111)
Creatinine, Ser: 0.59 mg/dL (ref 0.44–1.00)
GFR, Estimated: >60 mL/min (ref >60)
Glucose, Bld: 104 mg/dL — ABNORMAL HIGH (ref 70–99)
Potassium: 3.7 mmol/L (ref 3.5–5.1)
Sodium: 136 mmol/L (ref 135–145)
Total Bilirubin: 0.5 mg/dL (ref 0.0–1.2)
Total Protein: 6.9 g/dL (ref 6.5–8.1)

## 2023-04-10 LAB — CBC
HCT: 32 % — ABNORMAL LOW (ref 36.0–46.0)
Hemoglobin: 11 g/dL — ABNORMAL LOW (ref 12.0–15.0)
MCH: 30.9 pg (ref 26.0–34.0)
MCHC: 34.4 g/dL (ref 30.0–36.0)
MCV: 89.9 fL (ref 80.0–100.0)
Platelets: 271 10*3/uL (ref 150–400)
RBC: 3.56 MIL/uL — ABNORMAL LOW (ref 3.87–5.11)
RDW: 13.1 % (ref 11.5–15.5)
WBC: 4 10*3/uL (ref 4.0–10.5)
nRBC: 0 % (ref 0.0–0.2)

## 2023-04-10 LAB — RESP PANEL BY RT-PCR (RSV, FLU A&B, COVID)  RVPGX2
Influenza A by PCR: NEGATIVE
Influenza B by PCR: NEGATIVE
Resp Syncytial Virus by PCR: NEGATIVE
SARS Coronavirus 2 by RT PCR: POSITIVE — AB

## 2023-04-10 LAB — URINALYSIS, ROUTINE W REFLEX MICROSCOPIC
Bilirubin Urine: NEGATIVE
Glucose, UA: NEGATIVE mg/dL
Hgb urine dipstick: NEGATIVE
Ketones, ur: 5 mg/dL — AB
Nitrite: NEGATIVE
Protein, ur: 30 mg/dL — AB
Specific Gravity, Urine: 1.028 (ref 1.005–1.030)
pH: 5 (ref 5.0–8.0)

## 2023-04-10 LAB — PROTEIN / CREATININE RATIO, URINE
Creatinine, Urine: 379 mg/dL
Protein Creatinine Ratio: 0.13 mg/mg{creat} (ref 0.00–0.15)
Total Protein, Urine: 51 mg/dL

## 2023-04-10 MED ORDER — FLUTICASONE PROPIONATE 50 MCG/ACT NA SUSP: 2.0000 | Freq: Every day | NASAL | 2 refills | Status: DC

## 2023-04-10 MED ORDER — DIPHENHYDRAMINE HCL 25 MG PO CAPS
25.0000 mg | ORAL_CAPSULE | Freq: Once | ORAL | Status: AC
  Administered 2023-04-10: 25 mg via ORAL
  Filled 2023-04-10: qty 1

## 2023-04-10 MED ORDER — IPRATROPIUM-ALBUTEROL 0.5-2.5 (3) MG/3ML IN SOLN
3.0000 mL | Freq: Once | RESPIRATORY_TRACT | Status: AC
  Administered 2023-04-10: 3 mL via RESPIRATORY_TRACT
  Filled 2023-04-10: qty 3

## 2023-04-10 MED ORDER — GUAIFENESIN ER 600 MG PO TB12: 600.0000 mg | ORAL_TABLET | Freq: Two times a day (BID) | ORAL | 1 refills | Status: DC | PRN

## 2023-04-10 MED ORDER — ACETAMINOPHEN 500 MG PO TABS
1000.0000 mg | ORAL_TABLET | Freq: Once | ORAL | Status: AC
  Administered 2023-04-10: 1000 mg via ORAL
  Filled 2023-04-10: qty 2

## 2023-04-10 MED ORDER — ALBUTEROL SULFATE HFA 108 (90 BASE) MCG/ACT IN AERS: 1.0000 | INHALATION_SPRAY | Freq: Four times a day (QID) | RESPIRATORY_TRACT | 0 refills | Status: AC | PRN

## 2023-04-10 NOTE — MAU Provider Note (Addendum)
MAU Provider Note  Chief Complaint: Hypertension, Fatigue, Blurred Vision, and Shortness of Breath   Event Date/Time   First Provider Initiated Contact with Patient 04/10/23 1927      SUBJECTIVE HPI: Judy Wood is a 28 y.o. G3P2002 at [redacted]w[redacted]d by early ultrasound who presents to maternity admissions reporting elevated BP and flu-like symptoms. Pregnancy c/b cHTN, obesity, hx abruption. Receives [facility] with [facility].  Patient notes flu-like symptoms since last Thursday. Has lots of congestion, general aches, fatigue, cough. Notes chest tightness but no pain. Hx of asthma as a kid. Denies fever, urinary symptoms, VB, LOF, contractions. Daughter diagnosed with flu and COVID last night.  Patient has been taking labetalol 450 TID since last MAU visit 1/20. Notes her BP have been under control except for after getting sick. No headache today, vision changes, RUQ pain.   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   Fracture of right proximal fibula 10/06/2022   H/O umbilical hernia repair    Hernia, umbilical    Hypertension    on meds   Hypertension    Obesity    Open dislocation of left talus 10/06/2022   Syphilis 08/30/2014   Treated   Past Surgical History:  Procedure Laterality Date   CESAREAN SECTION N/A 05/19/2012   Procedure:  Primary CESAREAN SECTION  of baby girl  at 1933  APGAR 4/5/5;  Surgeon: Adam Phenix, MD;  Location: [facility];  Service: Obstetrics;  Laterality: N/A;   EXTERNAL FIXATION REMOVAL Left 09/30/2022   Procedure: REMOVAL EXTERNAL FIXATION LEG;  Surgeon: Roby Lofts, MD;  Location: [facility];  Service: Orthopedics;  Laterality: Left;   I & D EXTREMITY Left 09/28/2022   Procedure: IRRIGATION AND DEBRIDEMENT EXTREMITY;  Surgeon: Roby Lofts, MD;  Location: [facility];  Service: Orthopedics;  Laterality: Left;   OPEN REDUCTION INTERNAL FIXATION (ORIF) FOOT  LISFRANC FRACTURE Left 09/28/2022   Procedure: External fixation of left ankle;  Surgeon: Roby Lofts, MD;  Location: [facility];  Service: Orthopedics;  Laterality: Left;   ORIF CALCANEOUS FRACTURE Left 09/30/2022   Procedure: OPEN REDUCTION INTERNAL FIXATION (ORIF) TALUS FRACTURE;  Surgeon: Roby Lofts, MD;  Location: [facility];  Service: Orthopedics;  Laterality: Left;   UMBILICAL HERNIA REPAIR     as a child   UMBILICAL HERNIA REPAIR     Social History   Socioeconomic History   Marital status: Single    Spouse name: Not on file   Number of children: Not on file   Years of education: Not on file   Highest education level: Not on file  Occupational History   Not on file  Tobacco Use   Smoking status: Some Days    Current packs/day: 0.50    Types: Cigarettes   Smokeless tobacco: Never  Vaping Use   Vaping status: Never Used  Substance and Sexual Activity   Alcohol use: Not Currently   Drug use: Not Currently    Types: Marijuana, Other-see comments    Comment: Stopped when she found out she was pregnant    Sexual activity: Yes    Birth control/protection: None  Other Topics Concern   Not on file  Social History Narrative   Not on file   Social Drivers of Health   Financial Resource Strain: Not on file  Food Insecurity: Not on  file  Transportation Needs: Not on file  Physical Activity: Not on file  Stress: Not on file  Social Connections: Not on file  Intimate Partner Violence: Not on file   No current facility-administered medications on file prior to encounter.   Current Outpatient Medications on File Prior to Encounter  Medication Sig Dispense Refill   aspirin EC 81 MG tablet Take 2 tablets (162 mg total) by mouth at bedtime. Start taking when you are [redacted] weeks pregnant for rest of pregnancy for prevention of preeclampsia 300 tablet 2   emtricitabine-tenofovir (TRUVADA) 200-300 MG tablet Take 1 tablet by mouth daily. 90 tablet 3   labetalol (NORMODYNE) 300 MG tablet  Take 1 tablet (300 mg total) by mouth 3 (three) times daily. 90 tablet 0   Prenatal 28-0.8 MG TABS Take 1 tablet by mouth daily. 30 tablet 12   acetaminophen (TYLENOL) 325 MG tablet Take 2 tablets (650 mg total) by mouth every 6 (six) hours as needed for mild pain (pain score 1-3).     Blood Pressure Monitoring (BLOOD PRESSURE KIT) DEVI 1 Device by Does not apply route daily. (Patient not taking: Reported on 03/14/2023) 1 each 0   Cholecalciferol 25 MCG (1000 UT) tablet Take 2 tablets (2,000 Units total) by mouth daily at 2 PM. (Patient not taking: Reported on 03/14/2023) 180 tablet 0   HYDROcodone-acetaminophen (NORCO) 5-325 MG tablet Take 1-1.5 tablets by mouth every 6 (six) hours as needed for moderate pain (pain score 4-6). 120 tablet 0   methocarbamol (ROBAXIN) 500 MG tablet Take 1 tablet (500 mg total) by mouth 4 (four) times daily. (Patient not taking: Reported on 03/14/2023) 120 tablet 1   prazosin (MINIPRESS) 1 MG capsule Take 4 capsules (4 mg total) by mouth at bedtime. (Patient not taking: Reported on 03/14/2023) 120 capsule 1   Allergies  Allergen Reactions   Banana Anaphylaxis   Lactose Intolerance (Gi) Diarrhea and Nausea Only   Sulfa Antibiotics Other (See Comments)    Childhood reaction    ROS:  Pertinent positives/negatives listed above.  I have reviewed patient's Past Medical Hx, Surgical Hx, Family Hx, Social Hx, medications and allergies.   Physical Exam  Patient Vitals for the past 24 hrs:  BP Temp Temp src Pulse Resp SpO2 Height Weight  04/10/23 1931 (!) 140/92 -- -- 84 -- -- -- --  04/10/23 1927 (!) 156/87 -- -- 80 -- -- -- --  04/10/23 1851 (!) 140/84 98 F (36.7 C) Oral 85 18 100 % -- --  04/10/23 1841 -- -- -- -- -- -- 5\' 4"  (1.626 m) 126.1 kg   Constitutional: Well-developed, well-nourished female in no acute distress  Cardiovascular: normal rate, regular rhythm, no m/r/g Respiratory: upper airway noise present otherwise clear GI: Abd soft, non-tender,  gravid MS: Extremities nontender, trace edema, normal ROM Neurologic: Alert and oriented x 4  GU: Neg CVAT  FHT:  Baseline 140, moderate variability, 10x10 accelerations present, no decelerations Contractions: none  LAB RESULTS Results for orders placed or performed during the hospital encounter of 04/10/23 (from the past 24 hours)  CBC     Status: Abnormal   Collection Time: 04/10/23  6:59 PM  Result Value Ref Range   WBC 4.0 4.0 - 10.5 K/uL   RBC 3.56 (L) 3.87 - 5.11 MIL/uL   Hemoglobin 11.0 (L) 12.0 - 15.0 g/dL   HCT 16.1 (L) 09.6 - 04.5 %   MCV 89.9 80.0 - 100.0 fL   MCH 30.9 26.0 - 34.0 pg  MCHC 34.4 30.0 - 36.0 g/dL   RDW 16.1 09.6 - 04.5 %   Platelets 271 150 - 400 K/uL   nRBC 0.0 0.0 - 0.2 %  Comprehensive metabolic panel     Status: Abnormal   Collection Time: 04/10/23  6:59 PM  Result Value Ref Range   Sodium 136 135 - 145 mmol/L   Potassium 3.7 3.5 - 5.1 mmol/L   Chloride 104 98 - 111 mmol/L   CO2 22 22 - 32 mmol/L   Glucose, Bld 104 (H) 70 - 99 mg/dL   BUN <5 (L) 6 - 20 mg/dL   Creatinine, Ser 4.09 0.44 - 1.00 mg/dL   Calcium 9.0 8.9 - 81.1 mg/dL   Total Protein 6.9 6.5 - 8.1 g/dL   Albumin 3.1 (L) 3.5 - 5.0 g/dL   AST 19 15 - 41 U/L   ALT 15 0 - 44 U/L   Alkaline Phosphatase 61 38 - 126 U/L   Total Bilirubin 0.5 0.0 - 1.2 mg/dL   GFR, Estimated >91 >47 mL/min   Anion gap 10 5 - 15    A/Positive/-- (10/23 1514)  IMAGING   MAU Management/MDM: Orders Placed This Encounter  Procedures   Resp panel by RT-PCR (RSV, Flu A&B, Covid) Anterior Nasal Swab   CBC   Comprehensive metabolic panel   Protein / creatinine ratio, urine   Urinalysis, Routine w reflex microscopic -Urine, Clean Catch   Airborne and Contact precautions    Meds ordered this encounter  Medications   ipratropium-albuterol (DUONEB) 0.5-2.5 (3) MG/3ML nebulizer solution 3 mL   acetaminophen (TYLENOL) tablet 1,000 mg   diphenhydrAMINE (BENADRYL) capsule 25 mg     Available prenatal  records reviewed.  Patient presents with sick symptoms x4 days with elevated BP at [redacted]w[redacted]d with hx of cHTN. Daughter tested positive for flu/COVID last night.  #Flu-like symptoms: Suspect flu/COVID infections given recent close exposure and symptoms. Ongoing for 4 days, so out of window for antiviral treatment. Respiratory swab obtained. Will treat symptoms with tylenol, benadryl, duo-neb. Reassuringly not meeting septic criteria or showing complication of flu/COVID at this time.  #cHTN: Taking labetalol 450 mg TID with previous good control of symptoms prior to illness. Not having severe range pressures or severe features at this time. Will obtain preE work-up. Discussed that it is difficult to control especially in setting of illness.  ASSESSMENT 1. Preexisting hypertension complicating pregnancy, antepartum     PLAN Patient signed out to oncoming provider, Hilda Lias, CNM for further care 2001.  Wylene Simmer, MD OB Fellow 04/10/2023  7:53 PM   Results for orders placed or performed during the hospital encounter of 04/10/23 (from the past 24 hours)  Resp panel by RT-PCR (RSV, Flu A&B, Covid) Anterior Nasal Swab     Status: Abnormal   Collection Time: 04/10/23  6:52 PM   Specimen: Anterior Nasal Swab  Result Value Ref Range   SARS Coronavirus 2 by RT PCR POSITIVE (A) NEGATIVE   Influenza A by PCR NEGATIVE NEGATIVE   Influenza B by PCR NEGATIVE NEGATIVE   Resp Syncytial Virus by PCR NEGATIVE NEGATIVE  CBC     Status: Abnormal   Collection Time: 04/10/23  6:59 PM  Result Value Ref Range   WBC 4.0 4.0 - 10.5 K/uL   RBC 3.56 (L) 3.87 - 5.11 MIL/uL   Hemoglobin 11.0 (L) 12.0 - 15.0 g/dL   HCT 82.9 (L) 56.2 - 13.0 %   MCV 89.9 80.0 - 100.0 fL   MCH  30.9 26.0 - 34.0 pg   MCHC 34.4 30.0 - 36.0 g/dL   RDW 16.1 09.6 - 04.5 %   Platelets 271 150 - 400 K/uL   nRBC 0.0 0.0 - 0.2 %  Comprehensive metabolic panel     Status: Abnormal   Collection Time: 04/10/23  6:59 PM  Result Value Ref  Range   Sodium 136 135 - 145 mmol/L   Potassium 3.7 3.5 - 5.1 mmol/L   Chloride 104 98 - 111 mmol/L   CO2 22 22 - 32 mmol/L   Glucose, Bld 104 (H) 70 - 99 mg/dL   BUN <5 (L) 6 - 20 mg/dL   Creatinine, Ser 4.09 0.44 - 1.00 mg/dL   Calcium 9.0 8.9 - 81.1 mg/dL   Total Protein 6.9 6.5 - 8.1 g/dL   Albumin 3.1 (L) 3.5 - 5.0 g/dL   AST 19 15 - 41 U/L   ALT 15 0 - 44 U/L   Alkaline Phosphatase 61 38 - 126 U/L   Total Bilirubin 0.5 0.0 - 1.2 mg/dL   GFR, Estimated >91 >47 mL/min   Anion gap 10 5 - 15  Protein / creatinine ratio, urine     Status: None   Collection Time: 04/10/23  7:53 PM  Result Value Ref Range   Creatinine, Urine 379 mg/dL   Total Protein, Urine 51 mg/dL   Protein Creatinine Ratio 0.13 0.00 - 0.15 mg/mg[Cre]  Urinalysis, Routine w reflex microscopic -Urine, Clean Catch     Status: Abnormal   Collection Time: 04/10/23  7:53 PM  Result Value Ref Range   Color, Urine AMBER (A) YELLOW   APPearance CLOUDY (A) CLEAR   Specific Gravity, Urine 1.028 1.005 - 1.030   pH 5.0 5.0 - 8.0   Glucose, UA NEGATIVE NEGATIVE mg/dL   Hgb urine dipstick NEGATIVE NEGATIVE   Bilirubin Urine NEGATIVE NEGATIVE   Ketones, ur 5 (A) NEGATIVE mg/dL   Protein, ur 30 (A) NEGATIVE mg/dL   Nitrite NEGATIVE NEGATIVE   Leukocytes,Ua SMALL (A) NEGATIVE   RBC / HPF 0-5 0 - 5 RBC/hpf   WBC, UA 6-10 0 - 5 WBC/hpf   Bacteria, UA RARE (A) NONE SEEN   Squamous Epithelial / HPF 21-50 0 - 5 /HPF   Mucus PRESENT    Ca Oxalate Crys, UA PRESENT    Has been treated with Tylenol, Benadryl and DuoNeb with some relief Informed of Covid positive test Discussed supportive care  A:  Single IUP at [redacted]w[redacted]d       Covid infection       History of Asthma  P:  Discharge home       Covid precautions       Rx Flonase prn congestion       Rx Mucinex for cough       Rx Albuterol inhaler prn wheezing       Preeclampsia precautions       Encouraged to return if she develops worsening of symptoms, increase in pain,  fever, or other concerning symptoms.   Aviva Signs, CNM

## 2023-04-10 NOTE — MAU Note (Signed)
.  Kailiyah Rusk Mcvey is a 28 y.o. at [redacted]w[redacted]d here in MAU reporting: she has CHTN - taking medications. She reports that her BP was 167/111 at 2100 last night. She also reports fatigue, SOB, headache (denies at this time), and loss of appetite - first felt unwell around Thursday night. Denies vaginal bleeding or LOF. Reports +FM.   Daughter just tested positive for COVID and Flu.   PIH Assessment: Headache present: No  Visual disturbances: Blurred RUQ pain/Epigastric: Aching and Intermittent Atypical edema: None Hx of HBP: CHTN history of Pre-E with prior pregnancies BP Medications: labetalol   LMP: N/A Onset of complaint: Thursday Pain score: Denies pain at this time Vitals:   04/10/23 1851  BP: (!) 140/84  Pulse: 85  Resp: 18  Temp: 98 F (36.7 C)  SpO2: 100%     FHT: 138  Lab orders placed from triage: UA, resp swab

## 2023-04-10 NOTE — Telephone Encounter (Signed)
 Received babyscripts alerts for elevated BP from 2/8/225 thru 04/09/23. Per chart review patient has been messaged by provider who advised she come to the hospital for evaluation. I called Satsuki and she reports she is coming to the hospital but she has to take care of her daughter. I advised  I understand being a Mother she must take care of her daughter but we advise she must take care of herself and needs to come to the hospital . I asked when she is coming and she said should be within the hour. Alejandra Hurst

## 2023-04-12 ENCOUNTER — Ambulatory Visit: Payer: Medicaid Other | Admitting: Obstetrics and Gynecology

## 2023-04-12 ENCOUNTER — Other Ambulatory Visit: Payer: Self-pay

## 2023-04-12 DIAGNOSIS — Z6841 Body Mass Index (BMI) 40.0 and over, adult: Secondary | ICD-10-CM

## 2023-04-12 DIAGNOSIS — O98512 Other viral diseases complicating pregnancy, second trimester: Secondary | ICD-10-CM | POA: Diagnosis not present

## 2023-04-12 DIAGNOSIS — D271 Benign neoplasm of left ovary: Secondary | ICD-10-CM

## 2023-04-12 DIAGNOSIS — A528 Late syphilis, latent: Secondary | ICD-10-CM

## 2023-04-12 DIAGNOSIS — Z8759 Personal history of other complications of pregnancy, childbirth and the puerperium: Secondary | ICD-10-CM

## 2023-04-12 DIAGNOSIS — Z98891 History of uterine scar from previous surgery: Secondary | ICD-10-CM | POA: Diagnosis not present

## 2023-04-12 DIAGNOSIS — O99212 Obesity complicating pregnancy, second trimester: Secondary | ICD-10-CM

## 2023-04-12 DIAGNOSIS — Z3A26 26 weeks gestation of pregnancy: Secondary | ICD-10-CM

## 2023-04-12 DIAGNOSIS — U071 COVID-19: Secondary | ICD-10-CM

## 2023-04-12 DIAGNOSIS — I10 Essential (primary) hypertension: Secondary | ICD-10-CM

## 2023-04-12 NOTE — Progress Notes (Signed)
Pnv Asa Prep Labetalol tid 600
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
## 2023-04-24 ENCOUNTER — Ambulatory Visit: Payer: Medicaid Other | Admitting: *Deleted

## 2023-04-24 ENCOUNTER — Other Ambulatory Visit: Payer: Self-pay

## 2023-04-24 ENCOUNTER — Ambulatory Visit: Payer: Medicaid Other | Attending: Obstetrics and Gynecology

## 2023-04-24 VITALS — BP 141/88 | HR 83

## 2023-04-24 VITALS — BP 172/98

## 2023-04-24 DIAGNOSIS — O99213 Obesity complicating pregnancy, third trimester: Secondary | ICD-10-CM

## 2023-04-24 DIAGNOSIS — Z98891 History of uterine scar from previous surgery: Secondary | ICD-10-CM | POA: Insufficient documentation

## 2023-04-24 DIAGNOSIS — O34219 Maternal care for unspecified type scar from previous cesarean delivery: Secondary | ICD-10-CM | POA: Diagnosis present

## 2023-04-24 DIAGNOSIS — O10013 Pre-existing essential hypertension complicating pregnancy, third trimester: Secondary | ICD-10-CM

## 2023-04-24 DIAGNOSIS — O99210 Obesity complicating pregnancy, unspecified trimester: Secondary | ICD-10-CM | POA: Diagnosis present

## 2023-04-24 DIAGNOSIS — Z3A28 28 weeks gestation of pregnancy: Secondary | ICD-10-CM

## 2023-04-24 DIAGNOSIS — O10912 Unspecified pre-existing hypertension complicating pregnancy, second trimester: Secondary | ICD-10-CM | POA: Diagnosis present

## 2023-04-24 DIAGNOSIS — O0990 Supervision of high risk pregnancy, unspecified, unspecified trimester: Secondary | ICD-10-CM | POA: Diagnosis present

## 2023-04-24 DIAGNOSIS — O09293 Supervision of pregnancy with other poor reproductive or obstetric history, third trimester: Secondary | ICD-10-CM

## 2023-04-24 DIAGNOSIS — E669 Obesity, unspecified: Secondary | ICD-10-CM | POA: Diagnosis not present

## 2023-04-24 DIAGNOSIS — O10919 Unspecified pre-existing hypertension complicating pregnancy, unspecified trimester: Secondary | ICD-10-CM | POA: Diagnosis present

## 2023-04-24 DIAGNOSIS — O99212 Obesity complicating pregnancy, second trimester: Secondary | ICD-10-CM | POA: Diagnosis present

## 2023-04-28 ENCOUNTER — Other Ambulatory Visit: Payer: Medicaid Other

## 2023-04-28 ENCOUNTER — Other Ambulatory Visit: Payer: Self-pay

## 2023-04-28 ENCOUNTER — Ambulatory Visit: Payer: Medicaid Other | Admitting: Obstetrics and Gynecology

## 2023-04-28 VITALS — BP 145/86 | HR 98 | Wt 289.0 lb

## 2023-04-28 DIAGNOSIS — A528 Late syphilis, latent: Secondary | ICD-10-CM | POA: Diagnosis not present

## 2023-04-28 DIAGNOSIS — Z8759 Personal history of other complications of pregnancy, childbirth and the puerperium: Secondary | ICD-10-CM | POA: Diagnosis not present

## 2023-04-28 DIAGNOSIS — I10 Essential (primary) hypertension: Secondary | ICD-10-CM

## 2023-04-28 DIAGNOSIS — O0990 Supervision of high risk pregnancy, unspecified, unspecified trimester: Secondary | ICD-10-CM

## 2023-04-28 DIAGNOSIS — O0993 Supervision of high risk pregnancy, unspecified, third trimester: Secondary | ICD-10-CM

## 2023-04-28 DIAGNOSIS — Z98891 History of uterine scar from previous surgery: Secondary | ICD-10-CM | POA: Diagnosis not present

## 2023-04-28 DIAGNOSIS — Z3A29 29 weeks gestation of pregnancy: Secondary | ICD-10-CM

## 2023-04-28 MED ORDER — NIFEDIPINE ER OSMOTIC RELEASE 30 MG PO TB24: 30.0000 mg | ORAL_TABLET | Freq: Two times a day (BID) | ORAL | 11 refills | Status: DC
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
## 2023-04-28 NOTE — Patient Instructions (Signed)
 [facility] Pediatric Providers  [place]outheast [place] ([address]) [facility] Manson Passey, MD; Deirdre Priest, MD; Lum Babe, MD; Leveda Anna, MD; McDiarmid, MD; Jerene Bears, MD [address] [place], [place] [address] [phone] Mon-Fri 8:30-12:30, 1:30-5:00  Providers come to see babies during newborn hospitalization Only accepting infants of Mother's who are seen at [facility] or have siblings seen at   [facility] Medicaid - Yes; Tricare - Yes   [facility] Sewell, MD [address] [place], [place] [address] [phone] Mon, Tue, Thur, Fri 8:30-5:00, Wed 10:00-7:00 (closed 1-2pm daily for lunch) [facility] residents with no insurance.  [place] [employer] only with Medicaid/insurance; Tricare - no  [facility]) - [facility], MD; Manson Passey, MD; Ave Filter, MD; Luna Fuse, MD; Kennedy Bucker, MD; Florestine Avers, MD; Melchor Amour, MD; Yetta Barre,  MD; Konrad Dolores, MD; Kathlene November, MD; Jenne Campus, MD; Wynetta Emery, MD; Duffy Rhody, MD; Gerre Couch, NP [address] [place], [place], [place] [address] [phone] Mon, Tue, Thur, Fri 8:30-5:30, Wed 9:30-5:30, Sat 8:30-12:30 Only accepting infants of first-time parents or siblings of current patients Hospital discharge coordinator will make follow-up appointment Medicaid - yes; Tricare - yes  [place] [phone]) [facility] Ilean China, MD; Earlene Plater, MD; Jamesetta Orleans, MD; Alvera Novel, MD; Rana Snare, MD; [facility], MD; Shaaron Adler, MD; Hosie Poisson, MD; Mayford Knife, MD [address], [place], [place] [address] [phone] Mon-Fri 8:30-5:00, closed for lunch 12:30-1:30; Sat-Sun 10:00-1:00 Accepting Newborns with commercial insurance only, must call prior to delivery to be accepted into  practice.  Medicaid - no, Tricare - yes   [facility] [address] [place], [place] [address] [phone] Mon to Fri 8am to 10pm, Sat 8am to 1pm  (virtual only on weekends) Only accepts Medicaid Healthy Blue pts  [facility] ([facility]) - Pediatrics at Elige Radon, MD; Sabino Dick, MD; Quitman Livings, MD; Betha Loa, NP; Claretha Cooper, MD; Lelon Perla, MD [address] [place], [place] [address] [phone] Mon-Fri 8:30-5:30 Medicaid - yes, Tricare - yes  [place] [phone]) [facility] Marcie Mowers, MD [address], [place], [place] [address] [phone] Iona Hansen, Wed Fri 8:30-5:00, Sat 8:30-12:00, Closed Thursdays Accepting siblings of established patients and first time mom's if you call prenatally Medicaid- yes; Tricare - yes  [facility] Lutricia Feil, [place]; Tracie Harrier, MD; Rusty Aus; Scifres, PA; Wynelle Link, MD; Azucena Cecil, MD;  [address], [place], [place] [address] [phone] Mon-Fri 8:30-5:00, closed for lunch 1-2 Only accepting newborns of established patients Medicaid- no; Tricare - yes  [facility] [phone]) [facility] Morene Crocker, MD; [address], [place], [place] [address] [phone] Mon-Fri 8:00-5:00 Medicaid - No; Tricare - Yes  [facility], [place]; [place], [place] [address] [place], [place] [address] [phone] Mon-Fri 8:00-5:00 Medicaid - No, Tricare - Yes  [facility] Cardell Peach, MD; Nash Dimmer, MD; [place], [place] [address] [place], [place] [address] [phone]  Mon-Fri 8:00-5:00 Medicaid - No; Tricare - Yes  [facility] [address], [place], [place] [address] [phone] Mon-Fri 8:30-5:00 (lunch 12:00-1:00) Medicaid -Yes; Tricare - Yes  [facility] [place], MD [address] [place], [place], [place] [address] [phone] Mon-Fri 8:00-5:00 Seeing newborns of current patients only. No new patients Medicaid - No, Tricare - yes  Nature conservation officer at [place] [address] MD [address] [place], [place] [address] [phone] Mon-Fri 8:00-5:00 Medicaid -yes as secondary coverage only;  Tricare - yes  [facility] [place], [place]; [place], [place]; [place] Epley, MD; Vonna Kotyk, MD; Clance Boll, MD; [place], [place]; [place], NP; Vaughan Basta, MD; [place], MD [address] [place], [place] [address] [phone] Mon-Fri 8:30-5:00, Sat 9:00-11:00 Accepts commercial insurance ONLY. Offers free prenatal information sessions for families. Medicaid - No, Tricare - Call first  [facility], MD; [place], [place]; [place], [place]; [place], [place] [address] [place] [address] [phone] Mon-Fri 7:30-5:30 Medicaid - Yes; Ailene Rud yes  [place] [phone] & [phone])  [facility], MD [address] [place], [place] [address] [phone] Mon-Thur 8:00-6:00, closed for lunch 12-2, closed Fridays Medicaid - yes; Tricare - no  [facility] Dareen Piano, NP; Cyndia Bent, MD; [place], [place]; [place], [place] [address], [place], [place] [address] [phone] Mon-Fri 7:30-4:30 Medicaid - yes, Tricare - yes  [facility]  Juanito Doom, MD; Janene Harvey, NP; Vonita Moss, MD; Donn Pierini, NP [address], [place], [place] [address] [phone] Mon-Fri 8:30-5:00, closed for lunch 1-2, Sat 8:30-12:00 - sick visits only Providers come to see babies at [facility] Only accepting newborns of siblings and first time parents ONLY if who have met with office prior to delivery Medicaid -Yes; Tricare - yes  [facility] - [place], [place]; Spero Geralds, NP; Earlene Plater, MD; Lucretia Roers, MD:  [address], [place], [place] [address] [phone] Mon- Fri 8:00-5:00, Sat 9:00-12:00 - sick visits only Accepting siblings of established patients and first time mom/baby Medicaid - Yes; Tricare - yes Patients must have vaccinations (baby vaccines)  [place] [phone] &  [phone])  Adult nurse HealthCare at [facility] [address] [place], [place] [address] [phone] Mon-Fri 8:00-5:00 Medicaid - no; Tricare - yes  [facility], MD; [place], [place]; [place], [place] [address], [place], [place] [address] [phone] Mon-Fri 8:00-5:00 Medicaid- yes; Tricare - yes  [facility] Family Medicine - Ardeen Jourdain, MD; Yetta Barre, NP; [place], [place] [address] [place], [place], [place] [address] [phone] Mon-Fri 8:00-5:00 Medicaid - Yes; Tricare - yes  [address] [place] [phone])  [facility] [place], [place]; [place], [place]; Eddie Candle, MD; Normand Sloop, MD; [place], NP; [place], DO; [place], [place]; Constance Goltz, MD; Ruthann Cancer, MD; Vear Clock, MD; [place], [place]; [place], [place]; [place], [place] [address] [facility] [address], [place], [place] [address] [phone] Mon-Fri 8:30-5:00, Sat 9:00-12:00 - sick only Please register online [email or web address] then schedule online or call office Medicaid-Yes; Tricare -yes  [facility] - Premier  [place], MD; Romualdo Bolk, MD; [place], MD; [place], NP; [place], [place]; Antonietta Barcelona, MD; Mayford Knife, NP; Shelva Majestic, MD [address], [place], [place] [address] [phone] Mon-Fri 8:00-5:30, Sat&Sun by appointment (phones open at 8:30) Medicaid - Yes; Tricare - yes  [place] [phone] & [phone]) [facility] Mariel Aloe; [place], MD; Roger Shelter, MD; Arvilla Market, NP; [place], DO [address], [place], [place] [address] [phone] M-F 8:00 - 5:15, Sat/Sun 9-12 sick visits only Medicaid - No; Tricare - yes  [facility]  [place], PA-C; [place], PA-C; [place], DO; [place], PA-C; [place], PA-C; Roselyn Bering, MD [address], [place], [place] [address] [phone] Mon-Thur 8:00-7:00, Fri 8:00-5:00 Accepting Medicaid for 13 and under only   [facility] - Family Medicine  at [place] ([facility] [place]) [place], [place]; List, FNP; Berneda Rose, MD; [place], PA-C; Scholer,  MD; Kellie Simmering, FNP; Genevie Cheshire, FNP; Evaristo Bury, MD; Berneda Rose, MD [phone] [address], [place], [place] [address] [phone] Mon-Fri 8:30-5:30 Medicaid - Yes; Tricare - yes  [facility] - [address]  [place], [place]; Whitney Post, MD; Hennie Duos, MD; Wynne Dust, MD; [place], NP [address], [place], [place] [address] [phone] Mon-Thur 8:00-5:30, Fri 8:00-5:00, Sat 9:00-12:00 Medicaid - yes, Tricare - yes  [place] [phone])  [facility], [place]; Lenise Arena, MD; [place], [place] [address], [place], [place] [address] [phone] Mon-Fri 8:00-5:00, closed for lunch 12-1 Medicaid - No; Tricare - yes  Nature conservation officer at [facility], MD [address] [place], [place] [address] [phone] Mon-Fri 8:00-5:00 Medicaid - No; Tricare - yes  [facility] - [facility], MD; Tami Ribas, MD; Mariam Dollar, MD; Yetta Barre, MD [address], [place], [place] [address] [phone] Mon-Fri 8:00-5:00 Medicaid- Yes; Tricare - yes  [place] [phone])  Adult nurse HealthCare at [facility], [place]; [place], MD [address] [place], [place], [place] [address] [phone] Mon-Fri 8:00-5:00 Medicaid - No; Tricare - yes  [facility] Family Medicine - Whitney Post - CPNP [address], [place], [place] [address] [phone] Mon-Weds 8:00-6:00, Thurs-Fri 8:00-5:00, Sat 9:00-12:00 Medicaid - yes; Tricare - yes   [facility] Katharina Caper, MD; [place], [place] [address] [place], [place] [address] [phone] Mon-Fri 8:00-5:00 Medicaid - yes; Tricare - yes  [facility] Pediatric Providers  [facility] [address], [place], [place] [address] [phone] Sheral Flow: 8am -8pm, Tues, Weds: 8am - 5pm; Fri: 8-1 Medicaid - Yes; Tricare -  yes  [facility] Rachel Bo, MD; Laural Benes, MD; Anner Crete, MD; [place], [place]; [place], [place] [address] [place], [place] [address] [phone] M-F 8:30 - 5:00 Medicaid - Call office; Tricare -yes  [facility] Edson Snowball, MD; Shanon Rosser, MD, Chelsea Primus, MD; Shirlyn Goltz, PNP; Wardell Heath, NP [phone] [address] [place], [place] [address] [phone] M-F 8:30 - 5:00, Sat/Sun 8:30 - 12:30 (sick visits) Medicaid - Call office; Tricare -yes  [facility] Melvyn Neth, MD; Karl Luke, PNP; Princess Bruins, MD; [place], [place]; [place], NP; Cynda Familia [address], [place], [place] [address] [phone] M-F 8:30 - 5:00 Medicaid - Call office; Tricare - yes  [facility] Jesusita Oka, MD; Dierdre Highman, MD; Earnest Conroy, MD; Timothy Lasso, MD; Nogo, MD 516 502 [address] [place], [place] [address] [phone] M-Thur: 8:00 - 5:00; Fri: 8:00 - 4:00 Medicaid - yes; Tricare - yes  [facility] [address] [place], [place] [address] [phone] M-F: 8:30- 5:00, closed for lunch 12:30 - 1:00 Medicaid - yes; Tricare -yes  [facility] [address] [place], [place] [address] [phone] M-F 8:00 - 5:00 Medicaid - yes; Tricare - yes  [facility], DO; [place], DO; [place], NP [address] [place], [place] [address] [phone] M-F 8:00 - 5:00, Closed 12-1 for lunch Medicaid - Call; Tricare - yes  [facility] - Pediatrics Meredith Mody, MD [address] [place], [place] [address] [phone] M-F: 8:00-5:00, Sat: 8:00 - noon Medicaid - call; Tricare -yes  [facility] Pediatric Providers  [facility] [place], [place] [address] [place], [place], [place] [address] [phone] M-W: 8:00-5:00, Thur: 8:00 - 7:00, Fri: 8:00 - noon Medicaid - yes; Tricare - yes  [facility] - Quay Burow, FNP [address], [place],  [place] [address] [phone] M-F 8:00 - 5:00, Closed for lunch 12-1 Medicaid -  yes; Tricare - yes  [facility] Pediatric Providers  [facility], [place], Alinda Money, MD, [place], FNP-C [address] [facility], [address], [place], [place] [address] [phone] M-T 8:00-5:00, Wed-Fri 7:00-6:00 Medicaid - Yes; Tricare -yes  [facility] at [facility], [place]; [address] Saner [place], [place] [address] [phone] M-F 8:00 - 5:00, closed for lunch 12-1 Medicaid - Yes; Tricare - yes  [facility] and Internal Medicine  Zachery Dauer, MD; Gladstone Lighter, MD; Collie Siad, MD; Freda Jackson, MD; [place], MD; Darryl Nestle, MD; Melinda Crutch, MD, Audria Nine, MD; Tawanna Cooler, MD; Steffanie Dunn, MD; Byrd Hesselbach, MD; Lucretia Roers, MD [address], [place], [place] [address] [phone] M-F 8:00-5:00 Medicaid - yes; Tricare - yes  [facility] [place], MD (speaks [place] and Hindi) [address] [place], [place] [address] [phone] M-F: 8:30 - 5:00, closed 12:30 - 1 for lunch Medicaid - Yes; Tricare -yes  [facility] Pediatric Providers  [facility] Shanda Bumps, MD; Chanetta Marshall, MD; Laurell Josephs, MD [address] [place], [place] [address] [phone] M-Th: 8:00 - 5:30, Fri: 8:00 - 12:00 Medicaid - yes; Tricare - yes  [facility] - Pediatrics at [facility], NP; Thora Lance, MD; Orrin Brigham, MD [phone] [address] [place], [place] [address] [phone] M-F: 8:00 - 5:00 Medicaid - yes; Tricare - yes  [facility] Symonds, NP; Orson Slick, NP; Salley Scarlet, NP; Linton Flemings, MD; Mayford Knife, MD, [place], NP, Emelda Fear, MD; Nida Boatman [address] [place], [place] [address] [phone] M-F: 8:30 - 5:30p Medicaid - yes; Tricare - yes Other locations available as well  [facility], MD; Andrey Campanile, MD; Neville Route, PA-C [address], [place], [place] [address] [phone] M-W: 8:00am - 7:00pm, Thurs: 8:00am - 8:00pm; Fri: 8:00am -  5:00pm, closed daily from 12-1 for lunch Medicaid - yes; Tricare - yes  [facility] Pediatric Providers  [facility] at Levin Erp, MD; Aggie Cosier, FNP; Bland Span, MD; Tristan Schroeder, MD; Corinne, PNP; Alesia Banda; [place], [place]; Julian Reil, MD;  [address] [place], [place] [address] [phone] Judie Petit - Caleen Essex: 8am - 5pm, Sat 9-noon Medicaid - Yes; Tricare -yes  [facility] at Jaclynn Guarneri, MD; Yetta Barre, FNP; Lilian Kapur, MD; Mariam Dollar, MD [address] [place], TM[address] [phone] M-F 8:00 - 5:00 Medicaid - call; Tricare - yes  [facility]- Cruz Condon, MD; [place], [place]; Delora Fuel, MD; Dareen Piano, MD; Trudee Grip, MD; Kizzie Ide, MD; Zebedee [place]; Birdena Crandall, MD; Hinton Dyer, MD; [place], MD [address], [place], [place] [address] [phone] M-F 8:00am - 5:00pm; Sat. 9:00 - 11:00 Medicaid - yes; Tricare - yes  [facility] at [facility], MD [address], [place], [place] [address] [phone] M-F 8:00 - 5:00 Medicaid - [place] Medicaid only; Tricare - yes  [facility] - Illene Bolus, MD; Earlene Plater, [place]; Kenyon Ana, MD [address] [place], [place] [address] [phone] M-F 8:00 - 5:00 Medicaid - yes; Tricare - yes  [facility] [address] Pediatrics - Lind Covert, MD; [place] Passey, MD, [facility], MD, [place], MD; Toco, MD; Katrinka Blazing, MD; [address] [place], [place] [address] [phone] M-F: 8-5 Medicaid - yes; Tricare - yes  [facility] - Henrietta Hoover, [place]; [place], MD; [address], [place], [place] [address] [phone] M-F 8-5 Medicaid - yes; Tricare - yes  [address] [place] Darrol Poke, MD; Tami Ribas, MD;  [place], MD; [place]-Pal[place], DNP; [place], PNP [address], [place], [place] [address] [phone] M-F 8-5 Medicaid - yes; Tricare - yes  [facility] and Pediatrics Delories Heinz, MD;  Adrienne Mocha; Ala Bent, MD [address] [place], [place] [address] [phone] M-F 7am - 5 pm Medicaid - call; Tricare - yes  [facility] [place], [place]; Fredia Beets, MD; Roxan Hockey, MD [address] [place], [place] [address] [phone] M-F 8-5 Medicaid - yes; Tricare - yes  [facility] Kae Heller, MD; Sheliah Hatch, MD; Mayford Knife, FNP; Shon Baton, FNP; Tyron Russell, FNP; Ishmael Holter; [facility] - FNP [address], [place], [place] [address] [phone] M-F 8-5 Medicaid- yes; Tricare - yes  [facility] - Betsy Coder, [place]ively and Chalmers Guest, MD; Terrial Rhodes, MD; Hulda Humphrey, MD; Roseanne Reno, MD; [place], [place]; Ala Dach, MD; Fredia Beets, MD; Dimple Casey, MD [address], [place], [place] [address] [phone] M-F: 8-5, Sat: 9-4, Sun 9-12 Medicaid - yes; Tricare - yes  [facility] Little, PNP; Earlene Plater, PNP [address] [place], [place] [address] [phone] M-F 8 - 5, closed 12-1 for lunch Medicaid - yes; Tricare - yes  [facility] Kathyrn Lass, MD; Hal Neer, MD; Dimple Casey, MD; [place], DO [address] [place], [place] [address] [phone] M-F 8- 5:30 Medicaid - yes; Tricare - yes  [facility] Pediatrics - Biagio Quint, MD; Rosalia Hammers, MD; Gwenith Daily, MD [address], [place], [place] [address] [phone] Judie Petit: Nicholas Lose; Tues-Fri: 8-5; Sat: 9-12 Medicaid - yes; Tricare - yes  [facility] - Bobbye Morton, MD; Daphane Shepherd, MD; Chestine Spore, MD; Haskell Riling, MD; Kate Sable, MD [address], [place], [place] [address] [phone] Judie PetitMarland Kitchen Nicholas LoseFrancee Nodal: 8-5; Sat: 8:30-12:30 Medicaid - yes; Tricare - yes  [facility]lena Heck[facility]s - Beckey Rutter, MD; [place], [place] [address], [place], [place] [address] [phone] Mon-Fri: 8-5 Medicaid - yes;  Tricare - yes  [facility] Pediatrics - [place], CPNP; [place], [place]; Dimple Casey, MD; Alisa Graff, MD; Cephus Shelling, MD; [address] [place], [place] [address] [phone] M-F: 8-5, closed 1-2 for lunch Medicaid - yes; Tricare - yes  [facility] - [facility] [place], [place]; [place], [place]; Katrinka Blazing, MD; [place], CPNP; [place], [place]; [place], MD; Earlene Plater, MD [address], [place], [place] [address] [phone] M-Thurs: Nicholas Lose; Fri: 8-6; Sat: 9-12; Sun 2-4 Medicaid - yes; Tricare - yes  [facility] Georgeanna Lea, MD; Evette Cristal, MD; Shea Stakes, FNP; Earney Mallet, DO; [address] [place], [place] [address] [phone] M-F: 8-5 Medicaid - yes; Tricare - yes  [facility] Pediatric Providers  [facility] Medicine -Collene Mares, MD; [place], NP [address] [place], [place] [address] [phone] M - Fri: 8am - 5pm, closed for lunch 12-1 Medicaid - Yes; Tricare - yes  [facility] Elinor Parkinson, MD; Victory Dakin, MD; [place], DO; Vinocur, MD;Hall, PA; Clent Ridges, [place]; Orvan Falconer, NP [phone] [address] [place], [place] [address] [phone] M-F 8:00 - 5:00, Sat 8:00 - 11:30 Medicaid - yes; Tricare - yes  [facility] Welton Flakes, MD; [place], MD, [address] MD, [place], MD, [place], MD; [place], NP; [place], [place];  [address] [place], [place] [address] [phone] M-F 8:10am - 5:00pm Medicaid - yes; Tricare - yes  [facility] Hunter, MD; [place], NP [address] [place], [place] [address] [phone] M-F 8:00 - 5:00 Medicaid - [place] Medicaid only; Tricare - yes  [facility] Family Medicine - Deep [address] [place], [place]; [place], NP [address], [place], [place] [address] [phone] M-F 8:00 - 5:00; Closed for lunch 12 - 1:00 Medicaid - yes;  Tricare - yes  [facility] Belva Crome, MD; Jonita Albee, FNP [address], [place], [place] [address] [phone] Mon 9-5; Tues/Wed 10-5; Thurs 8:30-5; Fri: 8-12:30 Medicaid - yes; Tricare - yes  [facility] Pediatric Providers  [facility]  [place], MD; [place], [place] [address] [place], [place] [address] [phone] phone [phone] fax M-F 7:15 - 4:30 Medicaid - yes; Tricare - yes  [facility] Karilyn Cota, MD; [place], DO [address], [place], [place] [address] [phone] M-Fri: 8:30 - 5:00, closed for lunch everyday noon - 1pm Medicaid - Yes; Tricare - yes  [facility] Burdine, MD; Reuel Boom, MD; Dimas Aguas, MD; Neita Carp, MD; [place], [place]; Bonnita Nasuti, [place]; [place], [place]; [place], [place]; [place], [place] [address] [place], [place] [address] [phone] M-Thurs: 7:30am - 7:00pm; Friday 7:30am - 4pm; Sat: 8:00 - 1:00 Medicaid - Yes; Tricare - yes  [facility] [place] Morton, MD; Conni Elliot, MD; Carroll Kinds, MD; [place], DO [address], [place], [place] [address] [phone] M-Thur: 8:00 - 5:00, Fri: 8:00 - Noon Medicaid - yes; Tricare - yes No [place] [employer]  [place] [facility] Dettinger, MD; Nadine Counts, DO; [place], NP; Daphine Deutscher, NP; Lequita Halt, NP; Ellamae Sia, NP; Reginia Forts, NP; Darlyn Read, MD; [place], [place] [address] [place], [place] [address] [phone] M-F 8:00 - 5:00 Medicaid - yes; Tricare - yes  [facility] Health Care - [facility], FNP-C; [place], FNP-C [address] [place], [place] [address] [phone] M, W, R 8:00-5:00, Tues: 8:00am - 7:00pm; Fri 8:00 - noon Medicaid - Yes; Tricare - yes  [facility], MD [address] [place], [place] [address] [phone]  M-Thurs 8:30-5:30, Fri: 8:30-12:30pm Medicaid - Yes; Tricare - N

## 2023-04-29 LAB — GLUCOSE TOLERANCE, 2 HOURS W/ 1HR
Glucose, 1 hour: 118 mg/dL (ref 70–179)
Glucose, 2 hour: 74 mg/dL (ref 70–152)
Glucose, Fasting: 79 mg/dL (ref 70–91)

## 2023-05-01 LAB — CBC
Hematocrit: 30.3 % — ABNORMAL LOW (ref 34.0–46.6)
Hemoglobin: 9.9 g/dL — ABNORMAL LOW (ref 11.1–15.9)
MCH: 30.7 pg (ref 26.6–33.0)
MCHC: 32.7 g/dL (ref 31.5–35.7)
MCV: 94 fL (ref 79–97)
Platelets: 268 10*3/uL (ref 150–450)
RBC: 3.23 x10E6/uL — ABNORMAL LOW (ref 3.77–5.28)
RDW: 12.5 % (ref 11.7–15.4)
WBC: 8.1 10*3/uL (ref 3.4–10.8)

## 2023-05-01 LAB — RPR: RPR Ser Ql: REACTIVE — AB

## 2023-05-01 LAB — HIV ANTIBODY (ROUTINE TESTING W REFLEX): HIV Screen 4th Generation wRfx: NONREACTIVE

## 2023-05-01 LAB — RPR, QUANT+TP ABS (REFLEX)
Rapid Plasma Reagin, Quant: 1:16 {titer} — ABNORMAL HIGH
T Pallidum Abs: REACTIVE — AB

## 2023-05-02 ENCOUNTER — Encounter: Payer: Self-pay | Admitting: Obstetrics and Gynecology

## 2023-05-02 DIAGNOSIS — F419 Anxiety disorder, unspecified: Secondary | ICD-10-CM | POA: Insufficient documentation

## 2023-05-03 ENCOUNTER — Other Ambulatory Visit: Payer: Self-pay

## 2023-05-03 ENCOUNTER — Other Ambulatory Visit: Payer: Self-pay | Admitting: Advanced Practice Midwife

## 2023-05-03 DIAGNOSIS — O0990 Supervision of high risk pregnancy, unspecified, unspecified trimester: Secondary | ICD-10-CM

## 2023-05-04 ENCOUNTER — Encounter: Payer: Self-pay | Admitting: Obstetrics and Gynecology

## 2023-05-04 ENCOUNTER — Other Ambulatory Visit: Payer: Self-pay

## 2023-05-04 ENCOUNTER — Other Ambulatory Visit

## 2023-05-04 DIAGNOSIS — O0990 Supervision of high risk pregnancy, unspecified, unspecified trimester: Secondary | ICD-10-CM | POA: Diagnosis not present

## 2023-05-05 ENCOUNTER — Inpatient Hospital Stay (HOSPITAL_COMMUNITY)
Admission: AD | Admit: 2023-05-05 | Discharge: 2023-05-08 | DRG: 998 | Disposition: A | Discharge status: Home / Self Care | Attending: Obstetrics and Gynecology | Admitting: Obstetrics and Gynecology

## 2023-05-05 ENCOUNTER — Ambulatory Visit: Payer: Medicaid Other

## 2023-05-05 ENCOUNTER — Other Ambulatory Visit: Payer: Self-pay | Admitting: *Deleted

## 2023-05-05 ENCOUNTER — Encounter (HOSPITAL_COMMUNITY): Payer: Self-pay | Admitting: Obstetrics and Gynecology

## 2023-05-05 ENCOUNTER — Other Ambulatory Visit: Payer: Self-pay

## 2023-05-05 VITALS — BP 191/124 | HR 96 | Wt 291.0 lb

## 2023-05-05 DIAGNOSIS — Z98891 History of uterine scar from previous surgery: Secondary | ICD-10-CM

## 2023-05-05 DIAGNOSIS — O99213 Obesity complicating pregnancy, third trimester: Secondary | ICD-10-CM | POA: Diagnosis present

## 2023-05-05 DIAGNOSIS — F4310 Post-traumatic stress disorder, unspecified: Secondary | ICD-10-CM | POA: Diagnosis not present

## 2023-05-05 DIAGNOSIS — D509 Iron deficiency anemia, unspecified: Secondary | ICD-10-CM | POA: Diagnosis present

## 2023-05-05 DIAGNOSIS — Z6841 Body Mass Index (BMI) 40.0 and over, adult: Secondary | ICD-10-CM

## 2023-05-05 DIAGNOSIS — Z3A30 30 weeks gestation of pregnancy: Secondary | ICD-10-CM | POA: Diagnosis not present

## 2023-05-05 DIAGNOSIS — Z8249 Family history of ischemic heart disease and other diseases of the circulatory system: Secondary | ICD-10-CM | POA: Diagnosis not present

## 2023-05-05 DIAGNOSIS — O10919 Unspecified pre-existing hypertension complicating pregnancy, unspecified trimester: Secondary | ICD-10-CM | POA: Diagnosis present

## 2023-05-05 DIAGNOSIS — F17210 Nicotine dependence, cigarettes, uncomplicated: Secondary | ICD-10-CM | POA: Diagnosis not present

## 2023-05-05 DIAGNOSIS — O99333 Smoking (tobacco) complicating pregnancy, third trimester: Secondary | ICD-10-CM | POA: Diagnosis present

## 2023-05-05 DIAGNOSIS — O10913 Unspecified pre-existing hypertension complicating pregnancy, third trimester: Secondary | ICD-10-CM | POA: Diagnosis not present

## 2023-05-05 DIAGNOSIS — F32A Depression, unspecified: Secondary | ICD-10-CM | POA: Diagnosis not present

## 2023-05-05 DIAGNOSIS — O99013 Anemia complicating pregnancy, third trimester: Secondary | ICD-10-CM | POA: Diagnosis present

## 2023-05-05 DIAGNOSIS — O99343 Other mental disorders complicating pregnancy, third trimester: Secondary | ICD-10-CM | POA: Diagnosis present

## 2023-05-05 DIAGNOSIS — Z833 Family history of diabetes mellitus: Secondary | ICD-10-CM

## 2023-05-05 DIAGNOSIS — O99210 Obesity complicating pregnancy, unspecified trimester: Secondary | ICD-10-CM

## 2023-05-05 DIAGNOSIS — O34219 Maternal care for unspecified type scar from previous cesarean delivery: Secondary | ICD-10-CM | POA: Diagnosis present

## 2023-05-05 DIAGNOSIS — Z8616 Personal history of COVID-19: Secondary | ICD-10-CM

## 2023-05-05 DIAGNOSIS — O0990 Supervision of high risk pregnancy, unspecified, unspecified trimester: Secondary | ICD-10-CM

## 2023-05-05 LAB — ANEMIA PROFILE B
Basophils Absolute: 0 10*3/uL (ref 0.0–0.2)
Basos: 0 %
EOS (ABSOLUTE): 0.1 10*3/uL (ref 0.0–0.4)
Eos: 2 %
Ferritin: 30 ng/mL (ref 15–150)
Folate: >20 ng/mL (ref >3.0)
Hematocrit: 30.7 % — ABNORMAL LOW (ref 34.0–46.6)
Hemoglobin: 10.5 g/dL — ABNORMAL LOW (ref 11.1–15.9)
Immature Grans (Abs): 0 10*3/uL (ref 0.0–0.1)
Immature Granulocytes: 1 %
Iron Saturation: 38 % (ref 15–55)
Iron: 156 ug/dL (ref 27–159)
Lymphocytes Absolute: 2 10*3/uL (ref 0.7–3.1)
Lymphs: 24 %
MCH: 31.4 pg (ref 26.6–33.0)
MCHC: 34.2 g/dL (ref 31.5–35.7)
MCV: 92 fL (ref 79–97)
Monocytes Absolute: 0.8 10*3/uL (ref 0.1–0.9)
Monocytes: 9 %
Neutrophils Absolute: 5.5 10*3/uL (ref 1.4–7.0)
Neutrophils: 64 %
Platelets: 301 10*3/uL (ref 150–450)
RBC: 3.34 x10E6/uL — ABNORMAL LOW (ref 3.77–5.28)
RDW: 12 % (ref 11.7–15.4)
Retic Ct Pct: 1.9 % (ref 0.6–2.6)
Total Iron Binding Capacity: 415 ug/dL (ref 250–450)
UIBC: 259 ug/dL (ref 131–425)
Vitamin B-12: 454 pg/mL (ref 232–1245)
WBC: 8.4 10*3/uL (ref 3.4–10.8)

## 2023-05-05 LAB — COMPREHENSIVE METABOLIC PANEL
ALT: 10 U/L (ref 0–44)
AST: 16 U/L (ref 15–41)
Albumin: 3.1 g/dL — ABNORMAL LOW (ref 3.5–5.0)
Alkaline Phosphatase: 57 U/L (ref 38–126)
Anion gap: 9 (ref 5–15)
BUN: 8 mg/dL (ref 6–20)
CO2: 20 mmol/L — ABNORMAL LOW (ref 22–32)
Calcium: 9 mg/dL (ref 8.9–10.3)
Chloride: 105 mmol/L (ref 98–111)
Creatinine, Ser: 0.48 mg/dL (ref 0.44–1.00)
GFR, Estimated: >60 mL/min (ref >60)
Glucose, Bld: 100 mg/dL — ABNORMAL HIGH (ref 70–99)
Potassium: 3.7 mmol/L (ref 3.5–5.1)
Sodium: 134 mmol/L — ABNORMAL LOW (ref 135–145)
Total Bilirubin: 0.2 mg/dL (ref 0.0–1.2)
Total Protein: 6.7 g/dL (ref 6.5–8.1)

## 2023-05-05 LAB — CBC
HCT: 30.5 % — ABNORMAL LOW (ref 36.0–46.0)
Hemoglobin: 10.2 g/dL — ABNORMAL LOW (ref 12.0–15.0)
MCH: 30.9 pg (ref 26.0–34.0)
MCHC: 33.4 g/dL (ref 30.0–36.0)
MCV: 92.4 fL (ref 80.0–100.0)
Platelets: 305 10*3/uL (ref 150–400)
RBC: 3.3 MIL/uL — ABNORMAL LOW (ref 3.87–5.11)
RDW: 12.5 % (ref 11.5–15.5)
WBC: 8.6 10*3/uL (ref 4.0–10.5)
nRBC: 0 % (ref 0.0–0.2)

## 2023-05-05 LAB — TYPE AND SCREEN
ABO/RH(D): A POS
Antibody Screen: NEGATIVE

## 2023-05-05 LAB — PROTEIN / CREATININE RATIO, URINE
Creatinine, Urine: 98 mg/dL
Protein Creatinine Ratio: 0.21 mg/mg{creat} — ABNORMAL HIGH (ref 0.00–0.15)
Total Protein, Urine: 21 mg/dL

## 2023-05-05 MED ORDER — NIFEDIPINE 10 MG PO CAPS: 20.0000 mg | ORAL_CAPSULE | ORAL | Status: DC | PRN

## 2023-05-05 MED ORDER — LACTATED RINGERS IV SOLN: 125.0000 mL/h | INTRAVENOUS | Status: AC

## 2023-05-05 MED ORDER — ACETAMINOPHEN 500 MG PO TABS
1000.0000 mg | ORAL_TABLET | Freq: Four times a day (QID) | ORAL | Status: DC | PRN
  Administered 2023-05-05 – 2023-05-06 (×3): 1000 mg via ORAL
  Filled 2023-05-05 (×3): qty 2

## 2023-05-05 MED ORDER — HYDRALAZINE HCL 20 MG/ML IJ SOLN: 10.0000 mg | INTRAMUSCULAR | Status: DC | PRN

## 2023-05-05 MED ORDER — NIFEDIPINE 10 MG PO CAPS: 10.0000 mg | ORAL_CAPSULE | ORAL | Status: DC | PRN

## 2023-05-05 MED ORDER — DOCUSATE SODIUM 100 MG PO CAPS
100.0000 mg | ORAL_CAPSULE | Freq: Every day | ORAL | Status: DC
  Administered 2023-05-06: 100 mg via ORAL
  Filled 2023-05-05 (×4): qty 1

## 2023-05-05 MED ORDER — LABETALOL HCL 5 MG/ML IV SOLN: 40.0000 mg | INTRAVENOUS | Status: DC | PRN

## 2023-05-05 MED ORDER — ALBUTEROL SULFATE (2.5 MG/3ML) 0.083% IN NEBU: 2.5000 mg | INHALATION_SOLUTION | Freq: Four times a day (QID) | RESPIRATORY_TRACT | Status: DC | PRN

## 2023-05-05 MED ORDER — LABETALOL HCL 5 MG/ML IV SOLN
20.0000 mg | INTRAVENOUS | Status: DC | PRN
  Administered 2023-05-05 (×3): 20 mg via INTRAVENOUS
  Filled 2023-05-05 (×3): qty 4

## 2023-05-05 MED ORDER — ASPIRIN 81 MG PO TBEC
162.0000 mg | DELAYED_RELEASE_TABLET | Freq: Every day | ORAL | Status: DC
  Administered 2023-05-05 – 2023-05-07 (×3): 162 mg via ORAL
  Filled 2023-05-05 (×3): qty 2

## 2023-05-05 MED ORDER — HYDRALAZINE HCL 20 MG/ML IJ SOLN: 5.0000 mg | INTRAMUSCULAR | Status: DC | PRN

## 2023-05-05 MED ORDER — LABETALOL HCL 200 MG PO TABS
300.0000 mg | ORAL_TABLET | Freq: Three times a day (TID) | ORAL | Status: DC
  Administered 2023-05-05: 300 mg via ORAL
  Filled 2023-05-05: qty 3

## 2023-05-05 MED ORDER — NIFEDIPINE 10 MG PO CAPS
10.0000 mg | ORAL_CAPSULE | Freq: Once | ORAL | Status: AC
  Administered 2023-05-05: 10 mg via ORAL
  Filled 2023-05-05: qty 1

## 2023-05-05 MED ORDER — NIFEDIPINE ER OSMOTIC RELEASE 30 MG PO TB24: 30.0000 mg | ORAL_TABLET | Freq: Every day | ORAL | Status: DC

## 2023-05-05 MED ORDER — ZOLPIDEM TARTRATE 5 MG PO TABS
5.0000 mg | ORAL_TABLET | Freq: Every evening | ORAL | Status: DC | PRN
  Administered 2023-05-07: 5 mg via ORAL
  Filled 2023-05-05 (×2): qty 1

## 2023-05-05 MED ORDER — LABETALOL HCL 5 MG/ML IV SOLN: 20.0000 mg | INTRAVENOUS | Status: DC | PRN

## 2023-05-05 MED ORDER — NIFEDIPINE ER OSMOTIC RELEASE 30 MG PO TB24
30.0000 mg | ORAL_TABLET | Freq: Two times a day (BID) | ORAL | Status: DC
  Administered 2023-05-05: 30 mg via ORAL
  Filled 2023-05-05: qty 1

## 2023-05-05 MED ORDER — CALCIUM CARBONATE ANTACID 500 MG PO CHEW: 2.0000 | CHEWABLE_TABLET | ORAL | Status: DC | PRN

## 2023-05-05 MED ORDER — PRENATAL MULTIVITAMIN CH
1.0000 | ORAL_TABLET | Freq: Every day | ORAL | Status: DC
  Administered 2023-05-06 – 2023-05-07 (×2): 1 via ORAL
  Filled 2023-05-05 (×2): qty 1

## 2023-05-05 MED ORDER — OXYCODONE HCL 5 MG PO TABS
5.0000 mg | ORAL_TABLET | Freq: Once | ORAL | Status: AC
  Administered 2023-05-05: 5 mg via ORAL
  Filled 2023-05-05: qty 1
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
## 2023-05-05 NOTE — MAU Note (Signed)
.  Judy Wood is a 28 y.o. at [redacted]w[redacted]d here in MAU reporting: Sent over from office during initial assessment due to two severe range BP's. She reports she has not taken her BP meds today because she had not eaten anything yet. She reports yesterday her BP's were elevated at home and she had occasional blurred vision. Had a HA yesterday. None today. Denies VB or LOF. +FM.  CHTN.   Last meds: Labetalol 300 mg TID - last dose yesterday.  Has not started her Procardia.   Onset of complaint: Today Pain score: Denies pain.  Vitals:   05/05/23 1130  BP: (!) 176/121  Pulse: 87  Resp: 16  Temp: 98.3 F (36.8 C)  SpO2: 100%     FHT: 147 initial external Lab orders placed from triage: none

## 2023-05-05 NOTE — Progress Notes (Signed)
 Blood Pressure Check Visit  Judy Wood is here for blood pressure check following change of BP mediation on 04/28/23. Patient has a diagnosis of chronic hypertension and was originally prescribed 300 mg Labetalol TID. During her last prenatal visit on 04/28/23, patient was switched from labetalol and prescribed Procardia 30 mg BID.  Patient reports that she has not started her new prescription of Procardia yet. Patient reports that she is still taking labetalol TID because she wanted to finish the medication prior to picking up her Procardia. Patient last took Labetalol 300 mg last night before bed.  Today, BP is 172/122. Recheck BP is 191/124. Patient endorses some blurry vision and denies any dizziness, headache, shortness of breath, or peripheral edema. Consulted Dr. Nobie Putnam who recommends patient to go to the MAU immediately. Patient notified and patient voiced that she will go to the MAU. Patient aware of MAU location.  RN called report to [facility] at MAU.  Quintella Reichert, RN 05/05/2023  10:06 AM

## 2023-05-06 DIAGNOSIS — O10913 Unspecified pre-existing hypertension complicating pregnancy, third trimester: Principal | ICD-10-CM

## 2023-05-06 DIAGNOSIS — Z3A30 30 weeks gestation of pregnancy: Secondary | ICD-10-CM

## 2023-05-06 LAB — COMPREHENSIVE METABOLIC PANEL
ALT: 9 U/L (ref 0–44)
AST: 11 U/L — ABNORMAL LOW (ref 15–41)
Albumin: 2.7 g/dL — ABNORMAL LOW (ref 3.5–5.0)
Alkaline Phosphatase: 47 U/L (ref 38–126)
Anion gap: 8 (ref 5–15)
BUN: 7 mg/dL (ref 6–20)
CO2: 24 mmol/L (ref 22–32)
Calcium: 8.8 mg/dL — ABNORMAL LOW (ref 8.9–10.3)
Chloride: 105 mmol/L (ref 98–111)
Creatinine, Ser: 0.53 mg/dL (ref 0.44–1.00)
GFR, Estimated: >60 mL/min (ref >60)
Glucose, Bld: 82 mg/dL (ref 70–99)
Potassium: 3.7 mmol/L (ref 3.5–5.1)
Sodium: 137 mmol/L (ref 135–145)
Total Bilirubin: 0.3 mg/dL (ref 0.0–1.2)
Total Protein: 5.8 g/dL — ABNORMAL LOW (ref 6.5–8.1)

## 2023-05-06 LAB — CBC
HCT: 28.1 % — ABNORMAL LOW (ref 36.0–46.0)
Hemoglobin: 9.4 g/dL — ABNORMAL LOW (ref 12.0–15.0)
MCH: 30.8 pg (ref 26.0–34.0)
MCHC: 33.5 g/dL (ref 30.0–36.0)
MCV: 92.1 fL (ref 80.0–100.0)
Platelets: 259 10*3/uL (ref 150–400)
RBC: 3.05 MIL/uL — ABNORMAL LOW (ref 3.87–5.11)
RDW: 12.7 % (ref 11.5–15.5)
WBC: 8.6 10*3/uL (ref 4.0–10.5)
nRBC: 0 % (ref 0.0–0.2)

## 2023-05-06 LAB — RPR
RPR Ser Ql: REACTIVE — AB
RPR Titer: 1:8 {titer}

## 2023-05-06 MED ORDER — BUTALBITAL-APAP-CAFFEINE 50-325-40 MG PO TABS
2.0000 | ORAL_TABLET | Freq: Four times a day (QID) | ORAL | Status: DC | PRN
Start: 2023-05-06 — End: 2023-05-08
  Administered 2023-05-06 – 2023-05-07 (×2): 2 via ORAL
  Filled 2023-05-06 (×2): qty 2

## 2023-05-06 MED ORDER — NIFEDIPINE ER OSMOTIC RELEASE 60 MG PO TB24
60.0000 mg | ORAL_TABLET | Freq: Two times a day (BID) | ORAL | Status: DC
  Administered 2023-05-06 – 2023-05-08 (×5): 60 mg via ORAL
  Filled 2023-05-06 (×5): qty 1

## 2023-05-06 MED ORDER — LABETALOL HCL 200 MG PO TABS
200.0000 mg | ORAL_TABLET | Freq: Two times a day (BID) | ORAL | Status: DC
Start: 2023-05-06 — End: 2023-05-07
  Administered 2023-05-06: 200 mg via ORAL
  Filled 2023-05-06: qty 1
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
## 2023-05-07 LAB — COMPREHENSIVE METABOLIC PANEL
ALT: 10 U/L (ref 0–44)
AST: 13 U/L — ABNORMAL LOW (ref 15–41)
Albumin: 2.7 g/dL — ABNORMAL LOW (ref 3.5–5.0)
Alkaline Phosphatase: 46 U/L (ref 38–126)
Anion gap: 8 (ref 5–15)
BUN: 6 mg/dL (ref 6–20)
CO2: 21 mmol/L — ABNORMAL LOW (ref 22–32)
Calcium: 8.6 mg/dL — ABNORMAL LOW (ref 8.9–10.3)
Chloride: 106 mmol/L (ref 98–111)
Creatinine, Ser: 0.48 mg/dL (ref 0.44–1.00)
GFR, Estimated: >60 mL/min (ref >60)
Glucose, Bld: 84 mg/dL (ref 70–99)
Potassium: 3.6 mmol/L (ref 3.5–5.1)
Sodium: 135 mmol/L (ref 135–145)
Total Bilirubin: 0.2 mg/dL (ref 0.0–1.2)
Total Protein: 6.1 g/dL — ABNORMAL LOW (ref 6.5–8.1)

## 2023-05-07 LAB — CBC
HCT: 29.1 % — ABNORMAL LOW (ref 36.0–46.0)
Hemoglobin: 9.7 g/dL — ABNORMAL LOW (ref 12.0–15.0)
MCH: 30.6 pg (ref 26.0–34.0)
MCHC: 33.3 g/dL (ref 30.0–36.0)
MCV: 91.8 fL (ref 80.0–100.0)
Platelets: 278 10*3/uL (ref 150–400)
RBC: 3.17 MIL/uL — ABNORMAL LOW (ref 3.87–5.11)
RDW: 12.6 % (ref 11.5–15.5)
WBC: 8.9 10*3/uL (ref 4.0–10.5)
nRBC: 0 % (ref 0.0–0.2)

## 2023-05-07 LAB — FOLATE: Folate: 21.6 ng/mL (ref >5.9)

## 2023-05-07 LAB — IRON AND TIBC
Iron: 64 ug/dL (ref 28–170)
Saturation Ratios: 15 % (ref 10.4–31.8)
TIBC: 427 ug/dL (ref 250–450)
UIBC: 363 ug/dL

## 2023-05-07 LAB — FERRITIN: Ferritin: 4 ng/mL — ABNORMAL LOW (ref 11–307)

## 2023-05-07 LAB — VITAMIN B12: Vitamin B-12: 284 pg/mL (ref 180–914)

## 2023-05-07 MED ORDER — SERTRALINE HCL 50 MG PO TABS: 50.0000 mg | ORAL_TABLET | Freq: Every day | ORAL | Status: DC

## 2023-05-07 MED ORDER — SERTRALINE HCL 50 MG PO TABS
25.0000 mg | ORAL_TABLET | Freq: Every day | ORAL | Status: DC
Start: 2023-05-07 — End: 2023-05-14
  Administered 2023-05-07 – 2023-05-08 (×2): 25 mg via ORAL
  Filled 2023-05-07 (×2): qty 1

## 2023-05-07 MED ORDER — SODIUM CHLORIDE 0.9 % IV SOLN
500.0000 mg | Freq: Once | INTRAVENOUS | Status: AC
  Administered 2023-05-07: 500 mg via INTRAVENOUS
  Filled 2023-05-07: qty 25

## 2023-05-07 MED ORDER — LABETALOL HCL 200 MG PO TABS
400.0000 mg | ORAL_TABLET | Freq: Two times a day (BID) | ORAL | Status: DC
  Administered 2023-05-07 – 2023-05-08 (×3): 400 mg via ORAL
  Filled 2023-05-07 (×3): qty 2

## 2023-05-07 MED ORDER — HYDROXYZINE HCL 50 MG PO TABS: 25.0000 mg | ORAL_TABLET | Freq: Four times a day (QID) | ORAL | Status: DC | PRN

## 2023-05-07 NOTE — Plan of Care (Signed)
  Problem: Education: Goal: Knowledge of disease or condition will improve Outcome: Progressing Goal: Knowledge of the prescribed therapeutic regimen will improve Outcome: Progressing   Problem: Fluid Volume: Goal: Peripheral tissue perfusion will improve Outcome: Progressing   Problem: Clinical Measurements: Goal: Complications related to disease process, condition or treatment will be avoided or minimized Outcome: Progressing   Problem: Education: Goal: Knowledge of General Education information will improve Description: Including pain rating scale, medication(s)/side effects and non-pharmacologic comfort measures Outcome: Progressing
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
## 2023-05-08 ENCOUNTER — Encounter: Payer: Self-pay | Admitting: Obstetrics and Gynecology

## 2023-05-08 ENCOUNTER — Other Ambulatory Visit (HOSPITAL_COMMUNITY): Payer: Self-pay

## 2023-05-08 ENCOUNTER — Other Ambulatory Visit: Payer: Self-pay | Admitting: Obstetrics and Gynecology

## 2023-05-08 DIAGNOSIS — Z3A30 30 weeks gestation of pregnancy: Secondary | ICD-10-CM | POA: Diagnosis not present

## 2023-05-08 DIAGNOSIS — O10913 Unspecified pre-existing hypertension complicating pregnancy, third trimester: Principal | ICD-10-CM

## 2023-05-08 DIAGNOSIS — O99013 Anemia complicating pregnancy, third trimester: Secondary | ICD-10-CM | POA: Insufficient documentation

## 2023-05-08 LAB — T.PALLIDUM AB, TOTAL: T Pallidum Abs: REACTIVE — AB

## 2023-05-08 MED ORDER — LABETALOL HCL 200 MG PO TABS
400.0000 mg | ORAL_TABLET | Freq: Two times a day (BID) | ORAL | 1 refills | Status: DC
  Filled 2023-05-08: qty 80, 20d supply, fill #0
  Filled 2023-05-08: qty 40, 10d supply, fill #0

## 2023-05-08 MED ORDER — SERTRALINE HCL 50 MG PO TABS
50.0000 mg | ORAL_TABLET | Freq: Every day | ORAL | 1 refills | Status: DC
  Filled 2023-05-08 – 2023-06-02 (×2): qty 30, 30d supply, fill #0

## 2023-05-08 MED ORDER — NIFEDIPINE ER 60 MG PO TB24
60.0000 mg | ORAL_TABLET | Freq: Two times a day (BID) | ORAL | 1 refills | Status: DC
  Filled 2023-05-08: qty 60, 30d supply, fill #0

## 2023-05-08 MED ORDER — HYDROXYZINE HCL 25 MG PO TABS
25.0000 mg | ORAL_TABLET | Freq: Four times a day (QID) | ORAL | 0 refills | Status: AC | PRN
  Filled 2023-05-08: qty 30, 8d supply, fill #0

## 2023-05-08 MED ORDER — SERTRALINE HCL 25 MG PO TABS
25.0000 mg | ORAL_TABLET | Freq: Every day | ORAL | 0 refills | Status: DC
  Filled 2023-05-08: qty 5, 5d supply, fill #0

## 2023-05-08 NOTE — Discharge Summary (Addendum)
 Physician Discharge Summary  Patient ID: Judy Wood MRN: [id number] DOB/AGE: May 03, 1995 28 y.o.  Admit date: 05/05/2023 Discharge date: 05/08/2023  Admission Diagnoses:Uncontrolled CHTN  Discharge Diagnoses:  Principal Problem:   Chronic hypertension affecting pregnancy Active Problems:   History of cesarean section followed by vaginal birth (VBAC)   BMI 50.0-59.9, adult [facility])   Discharged Condition: good  Hospital Course: Patient admitted with poorly controlled HTN at 30 weeks. Her blood pressure medication were titrated up to a dose of procardia 60 mg BID and labetalol 400 mg BID. Her blood pressure were found to be stable for over 24 hours on day of discharge. Patient remained asymptomatic and labs remained normal. Patient reported good fetal movement. She denies contractions, vaginal bleeding or leakage of fluid. Precautions were reviewed with the patient with strict instructions. Follow up as scheduled for routine prenatal care. Patient has a BP cuff at home. Parameters were reviewed with instructions to contact the office  Consults: None    Discharge Exam: Blood pressure (!) 143/81, pulse 92, temperature 98.2 F (36.8 C), temperature source Oral, resp. rate 18, height 5\' 4"  (1.626 m), weight 133.5 kg, SpO2 95%. GENERAL: Well-developed, well-nourished female in no acute distress.  LUNGS: Clear to auscultation bilaterally.  HEART: Regular rate and rhythm. ABDOMEN: Soft, nontender, nondistended. gravid PELVIC: Not indicated EXTREMITIES: No cyanosis, clubbing, or edema, 2+ distal pulses.  FHT: baseline 130, mod variability, +accels, no decels  Disposition: Home There are no questions and answers to display.         Allergies as of 05/08/2023       Reactions   Banana Anaphylaxis   Lactose Intolerance (gi) Diarrhea, Nausea Only   Sulfa Antibiotics Other (See Comments)   Childhood reaction        Medication List     STOP taking these medications    prazosin  1 MG capsule Commonly known as: MINIPRESS       TAKE these medications    albuterol 108 (90 Base) MCG/ACT inhaler Commonly known as: VENTOLIN HFA Inhale 1-2 puffs into the lungs every 6 (six) hours as needed for wheezing or shortness of breath.   aspirin EC 81 MG tablet Take 2 tablets (162 mg total) by mouth at bedtime. Start taking when you are [redacted] weeks pregnant for rest of pregnancy for prevention of preeclampsia   Blood Pressure Kit Devi 1 Device by Does not apply route daily.   emtricitabine-tenofovir 200-300 MG tablet Commonly known as: Truvada Take 1 tablet by mouth daily.   fluticasone 50 MCG/ACT nasal spray Commonly known as: FLONASE Place 2 sprays into both nostrils daily.   hydrOXYzine 25 MG tablet Commonly known as: ATARAX Take 1 tablet (25 mg total) by mouth every 6 (six) hours as needed for anxiety.   Labetalol HCl 400 MG Tabs Take 400 mg by mouth 2 (two) times daily. What changed:  medication strength how much to take when to take this   methocarbamol 500 MG tablet Commonly known as: ROBAXIN Take 1 tablet (500 mg total) by mouth 4 (four) times daily.   NIFEdipine 60 MG 24 hr tablet Commonly known as: ADALAT CC Take 1 tablet (60 mg total) by mouth 2 (two) times daily. What changed:  medication strength how much to take when to take this   Prenatal 28-0.8 MG Tabs Take 1 tablet by mouth daily.   sertraline 25 MG tablet Commonly known as: ZOLOFT Take 1 tablet (25 mg total) by mouth daily.   sertraline 50 MG  tablet Commonly known as: ZOLOFT Take 1 tablet (50 mg total) by mouth daily. Start taking on: May 14, 2023   vitamin D3 25 MCG tablet Commonly known as: CHOLECALCIFEROL Take 2 tablets (2,000 Units total) by mouth daily at 2 PM.        Follow-up Information     [facility] Follow up on 05/17/2023.   Specialty: Obstetrics and Gynecology Why: As scheduled for routine prenatal  care Contact information: [address] [place] [address] [phone]                Signed: Catalina Antigua 05/08/2023, 9:47 AM

## 2023-05-08 NOTE — Progress Notes (Signed)
 Received secure email from [facility], CSW stating that during CSW's assessment on 05/07/2023, the patient requested to reschedule a missed IBH appointment.  Patient is currently admitted on OBSC.  Called Dr. Adrian Blackwater via [employer] and he said that faculty provider will place an order for [facility] referral at time of patient's discharge from hospital.  Salena Saner, RN 05/08/2023 13:09

## 2023-05-09 ENCOUNTER — Other Ambulatory Visit: Payer: Self-pay | Admitting: Certified Nurse Midwife

## 2023-05-09 ENCOUNTER — Encounter: Payer: Self-pay | Admitting: Obstetrics and Gynecology

## 2023-05-09 ENCOUNTER — Telehealth: Payer: Self-pay

## 2023-05-09 DIAGNOSIS — G8929 Other chronic pain: Secondary | ICD-10-CM

## 2023-05-09 MED ORDER — CYCLOBENZAPRINE HCL 10 MG PO TABS: 10.0000 mg | ORAL_TABLET | Freq: Three times a day (TID) | ORAL | 1 refills | Status: AC | PRN

## 2023-05-09 NOTE — Telephone Encounter (Signed)
 Dr. Vergie Living, patient will be scheduled as soon as possible.  Auth Submission: NO AUTH NEEDED Site of care: Site of care: [facility] Payer: [employer] [place] Medicaid Medication & CPT/J Code(s) submitted: Venofer (Iron Sucrose) J1756 Route of submission (phone, fax, portal):  Phone # Fax # Auth type: Buy/Bill PB Units/visits requested: 500mg  x 1 dose Reference number:  Approval from: 05/09/23 to 11/09/23

## 2023-05-09 NOTE — Progress Notes (Signed)
 Patient reached out with headache unrelieved by Tylenol. History of headaches. Recent PreE labs WNL.  Rx for flexeril sent to preferred pharmacy.   Lamont Snowball, MSN, CNM, RNC-OB Certified Nurse Midwife, [facility] 05/09/2023 4:36 PM

## 2023-05-10 ENCOUNTER — Other Ambulatory Visit: Payer: Self-pay | Admitting: *Deleted

## 2023-05-10 DIAGNOSIS — O99213 Obesity complicating pregnancy, third trimester: Secondary | ICD-10-CM

## 2023-05-10 DIAGNOSIS — O10913 Unspecified pre-existing hypertension complicating pregnancy, third trimester: Secondary | ICD-10-CM

## 2023-05-12 DIAGNOSIS — S82892S Other fracture of left lower leg, sequela: Secondary | ICD-10-CM | POA: Diagnosis not present

## 2023-05-12 DIAGNOSIS — F4310 Post-traumatic stress disorder, unspecified: Secondary | ICD-10-CM | POA: Diagnosis not present

## 2023-05-12 DIAGNOSIS — S92112B Displaced fracture of neck of left talus, initial encounter for open fracture: Secondary | ICD-10-CM | POA: Diagnosis not present

## 2023-05-12 DIAGNOSIS — S82831A Other fracture of upper and lower end of right fibula, initial encounter for closed fracture: Secondary | ICD-10-CM | POA: Diagnosis not present

## 2023-05-17 ENCOUNTER — Ambulatory Visit (INDEPENDENT_AMBULATORY_CARE_PROVIDER_SITE_OTHER): Payer: Medicaid Other | Admitting: Obstetrics & Gynecology

## 2023-05-17 ENCOUNTER — Other Ambulatory Visit: Payer: Self-pay

## 2023-05-17 ENCOUNTER — Encounter: Payer: Self-pay | Admitting: Obstetrics & Gynecology

## 2023-05-17 ENCOUNTER — Encounter: Payer: Medicaid Other | Admitting: Obstetrics & Gynecology

## 2023-05-17 ENCOUNTER — Telehealth: Payer: Self-pay | Admitting: *Deleted

## 2023-05-17 VITALS — BP 142/93 | HR 108 | Wt 292.0 lb

## 2023-05-17 DIAGNOSIS — O99013 Anemia complicating pregnancy, third trimester: Secondary | ICD-10-CM

## 2023-05-17 DIAGNOSIS — O10919 Unspecified pre-existing hypertension complicating pregnancy, unspecified trimester: Secondary | ICD-10-CM

## 2023-05-17 DIAGNOSIS — Z3A31 31 weeks gestation of pregnancy: Secondary | ICD-10-CM | POA: Diagnosis not present

## 2023-05-17 DIAGNOSIS — O0993 Supervision of high risk pregnancy, unspecified, third trimester: Secondary | ICD-10-CM | POA: Diagnosis not present

## 2023-05-17 DIAGNOSIS — O99213 Obesity complicating pregnancy, third trimester: Secondary | ICD-10-CM | POA: Diagnosis not present

## 2023-05-17 DIAGNOSIS — O0990 Supervision of high risk pregnancy, unspecified, unspecified trimester: Secondary | ICD-10-CM

## 2023-05-17 DIAGNOSIS — O10913 Unspecified pre-existing hypertension complicating pregnancy, third trimester: Secondary | ICD-10-CM | POA: Diagnosis not present

## 2023-05-17 DIAGNOSIS — Z98891 History of uterine scar from previous surgery: Secondary | ICD-10-CM | POA: Diagnosis not present

## 2023-05-17 MED ORDER — LABETALOL HCL 400 MG PO TABS: 800.0000 mg | ORAL_TABLET | Freq: Three times a day (TID) | ORAL | 2 refills | Status: DC

## 2023-05-17 NOTE — Telephone Encounter (Signed)
 Received babyscripts alert from 05/16/23 of 155/100 and 157/99 with c/o persistent headache.I called Judy Wood and she confirms she had headache all day yesterday, took tylenol without relief, went to bed last night and no headache today. She denies edema. She has not taken bp yet today. She confirms she is taking her 2 BP meds BID and has taken once today. I asked her to take BP in 5-10 minutes and enter into Babyscripts and to call me if 140/90 or higher again. [name]
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
## 2023-05-17 NOTE — Telephone Encounter (Signed)
 Do not see BP in Babyscripts for today. I called Judy Wood and she confirms she has been too busy but is on her way to ob appointment. RN will notifiy provider.  [name]

## 2023-05-17 NOTE — Patient Instructions (Signed)

## 2023-05-18 ENCOUNTER — Encounter: Payer: Self-pay | Admitting: Obstetrics & Gynecology

## 2023-05-18 LAB — CBC
Hematocrit: 31 % — ABNORMAL LOW (ref 34.0–46.6)
Hemoglobin: 10.3 g/dL — ABNORMAL LOW (ref 11.1–15.9)
MCH: 30.7 pg (ref 26.6–33.0)
MCHC: 33.2 g/dL (ref 31.5–35.7)
MCV: 93 fL (ref 79–97)
Platelets: 313 10*3/uL (ref 150–450)
RBC: 3.35 x10E6/uL — ABNORMAL LOW (ref 3.77–5.28)
RDW: 12.1 % (ref 11.7–15.4)
WBC: 8.6 10*3/uL (ref 3.4–10.8)

## 2023-05-18 LAB — COMPREHENSIVE METABOLIC PANEL
ALT: 6 IU/L (ref 0–32)
AST: 7 IU/L (ref 0–40)
Albumin: 4.1 g/dL (ref 4.0–5.0)
Alkaline Phosphatase: 70 IU/L (ref 44–121)
BUN/Creatinine Ratio: 16 (ref 9–23)
BUN: 7 mg/dL (ref 6–20)
Bilirubin Total: <0.2 mg/dL (ref 0.0–1.2)
CO2: 19 mmol/L — ABNORMAL LOW (ref 20–29)
Calcium: 9.3 mg/dL (ref 8.7–10.2)
Chloride: 103 mmol/L (ref 96–106)
Creatinine, Ser: 0.45 mg/dL — ABNORMAL LOW (ref 0.57–1.00)
Globulin, Total: 2.6 g/dL (ref 1.5–4.5)
Glucose: 104 mg/dL — ABNORMAL HIGH (ref 70–99)
Potassium: 4.1 mmol/L (ref 3.5–5.2)
Sodium: 136 mmol/L (ref 134–144)
Total Protein: 6.7 g/dL (ref 6.0–8.5)
eGFR: 133 mL/min/{1.73_m2} (ref >59)

## 2023-05-18 LAB — PROTEIN / CREATININE RATIO, URINE
Creatinine, Urine: 272.2 mg/dL
Protein, Ur: 50.4 mg/dL
Protein/Creat Ratio: 185 mg/g{creat} (ref 0–200)

## 2023-05-22 ENCOUNTER — Ambulatory Visit: Admitting: *Deleted

## 2023-05-22 ENCOUNTER — Ambulatory Visit: Payer: Medicaid Other | Attending: Maternal & Fetal Medicine

## 2023-05-22 ENCOUNTER — Ambulatory Visit: Payer: Medicaid Other

## 2023-05-22 ENCOUNTER — Ambulatory Visit: Attending: Obstetrics and Gynecology | Admitting: Obstetrics and Gynecology

## 2023-05-22 VITALS — BP 131/79

## 2023-05-22 VITALS — BP 153/102 | HR 98

## 2023-05-22 DIAGNOSIS — O10919 Unspecified pre-existing hypertension complicating pregnancy, unspecified trimester: Secondary | ICD-10-CM

## 2023-05-22 DIAGNOSIS — O0990 Supervision of high risk pregnancy, unspecified, unspecified trimester: Secondary | ICD-10-CM | POA: Diagnosis not present

## 2023-05-22 DIAGNOSIS — E6689 Other obesity not elsewhere classified: Secondary | ICD-10-CM | POA: Diagnosis not present

## 2023-05-22 DIAGNOSIS — O98113 Syphilis complicating pregnancy, third trimester: Secondary | ICD-10-CM

## 2023-05-22 DIAGNOSIS — E669 Obesity, unspecified: Secondary | ICD-10-CM | POA: Diagnosis not present

## 2023-05-22 DIAGNOSIS — O10013 Pre-existing essential hypertension complicating pregnancy, third trimester: Secondary | ICD-10-CM

## 2023-05-22 DIAGNOSIS — Z3A32 32 weeks gestation of pregnancy: Secondary | ICD-10-CM | POA: Diagnosis not present

## 2023-05-22 DIAGNOSIS — Z98891 History of uterine scar from previous surgery: Secondary | ICD-10-CM

## 2023-05-22 DIAGNOSIS — O99213 Obesity complicating pregnancy, third trimester: Secondary | ICD-10-CM

## 2023-05-22 DIAGNOSIS — O99210 Obesity complicating pregnancy, unspecified trimester: Secondary | ICD-10-CM | POA: Diagnosis not present

## 2023-05-22 DIAGNOSIS — Z6841 Body Mass Index (BMI) 40.0 and over, adult: Secondary | ICD-10-CM | POA: Diagnosis not present

## 2023-05-22 DIAGNOSIS — O10913 Unspecified pre-existing hypertension complicating pregnancy, third trimester: Secondary | ICD-10-CM

## 2023-05-22 NOTE — Progress Notes (Signed)
 Maternal-Fetal Medicine  Patient returned for fetal growth assessment and antenatal testing.  She has chronic hypertension and takes labetalol 400 mg twice daily and nifedipine XL 60 mg twice daily.  At her prenatal visit appointment on 05/17/2023, she was advised to discontinue nifedipine and take labetalol 800 mg 3 times daily.  Patient reports that she prefers to take both labetalol and nifedipine and did not change her dosage.  Her blood pressure today at our office were 153/102 and repeat 131/79 mmHg.  She does not have signs and symptoms of severe features of preeclampsia.  Maternal syphilis.  Patient completed benzathine penicillin treatment in this pregnancy.  Recent RPR titers assessed 2 weeks ago was 1: 8 (1: 16 five months ago).  Obstetric history significant for a term cesarean delivery followed by VBAC.  Ultrasound Fetal growth is appropriate for gestational age.  Amniotic fluid is normal and good fetal activity seen.  Placenta is anterior and there is no evidence of previa or placenta accreta spectrum.  Antenatal testing is reassuring.  BPP 8/8.  Cephalic presentation. No evidence of placental megaly.  No evidence of liver calcification.  No evidence of hydrops.  Our concerns include: Chronic hypertension I discussed the importance of taking regular medications and correct dosage to to control hypertension.  I discussed the parameters of severe range hypertension that can lead to complications like stroke.  Patient reports she has blood pressure cuff at home and checks regularly.  I advised her to come to our hospital if she has systolic blood pressures of 160 mmHg or above and/or diastolic blood pressures of 110 mmHg or above. I discussed timing of delivery.  If patient requires increasing doses of antihypertensives or if hypertension is not well-controlled, delivery should be considered at [redacted] weeks gestation.  If superimposed preeclampsia with severe features occur, delivery should  be considered at [redacted] weeks gestation or at diagnosis after 34 weeks.  Maternal syphilis I reassured the patient that RPR titers did not show a fourfold increase (decreased from previous level) and that 3 doses of benzathine penicillin is adequate for treatment of latent syphilis.  I reassured her of normal ultrasound findings.  Recommendations -Patient has appointments for weekly BPP at your office. -Fetal growth assessment and antenatal testing in 4 weeks. -Consider delivery at [redacted] weeks gestation.  Consultation including face-to-face (more than 50%) counseling 20 minutes.

## 2023-05-24 ENCOUNTER — Ambulatory Visit

## 2023-05-29 ENCOUNTER — Ambulatory Visit

## 2023-05-29 ENCOUNTER — Other Ambulatory Visit: Payer: Self-pay

## 2023-05-29 DIAGNOSIS — Z3A33 33 weeks gestation of pregnancy: Secondary | ICD-10-CM | POA: Diagnosis not present

## 2023-05-29 DIAGNOSIS — O99213 Obesity complicating pregnancy, third trimester: Secondary | ICD-10-CM | POA: Diagnosis not present

## 2023-05-29 DIAGNOSIS — O10913 Unspecified pre-existing hypertension complicating pregnancy, third trimester: Secondary | ICD-10-CM

## 2023-05-30 ENCOUNTER — Other Ambulatory Visit: Payer: Medicaid Other

## 2023-06-02 ENCOUNTER — Other Ambulatory Visit: Payer: Self-pay | Admitting: Obstetrics and Gynecology

## 2023-06-02 ENCOUNTER — Other Ambulatory Visit (HOSPITAL_BASED_OUTPATIENT_CLINIC_OR_DEPARTMENT_OTHER): Payer: Self-pay

## 2023-06-02 ENCOUNTER — Other Ambulatory Visit: Payer: Self-pay

## 2023-06-02 ENCOUNTER — Ambulatory Visit: Admitting: Obstetrics & Gynecology

## 2023-06-02 ENCOUNTER — Other Ambulatory Visit (HOSPITAL_COMMUNITY)
Admission: RE | Admit: 2023-06-02 | Discharge: 2023-06-02 | Disposition: A | Discharge status: Home / Self Care | Source: Ambulatory Visit | Attending: Obstetrics & Gynecology | Admitting: Obstetrics & Gynecology

## 2023-06-02 ENCOUNTER — Other Ambulatory Visit (HOSPITAL_COMMUNITY): Payer: Self-pay

## 2023-06-02 ENCOUNTER — Other Ambulatory Visit (INDEPENDENT_AMBULATORY_CARE_PROVIDER_SITE_OTHER): Payer: Self-pay | Admitting: Advanced Practice Midwife

## 2023-06-02 ENCOUNTER — Other Ambulatory Visit: Payer: Self-pay | Admitting: Obstetrics & Gynecology

## 2023-06-02 VITALS — BP 119/79 | HR 96 | Wt 287.0 lb

## 2023-06-02 DIAGNOSIS — O0990 Supervision of high risk pregnancy, unspecified, unspecified trimester: Secondary | ICD-10-CM

## 2023-06-02 DIAGNOSIS — O0993 Supervision of high risk pregnancy, unspecified, third trimester: Secondary | ICD-10-CM | POA: Diagnosis not present

## 2023-06-02 DIAGNOSIS — O10913 Unspecified pre-existing hypertension complicating pregnancy, third trimester: Secondary | ICD-10-CM

## 2023-06-02 DIAGNOSIS — Z98891 History of uterine scar from previous surgery: Secondary | ICD-10-CM | POA: Diagnosis not present

## 2023-06-02 DIAGNOSIS — Z3A34 34 weeks gestation of pregnancy: Secondary | ICD-10-CM | POA: Diagnosis not present

## 2023-06-02 DIAGNOSIS — O10919 Unspecified pre-existing hypertension complicating pregnancy, unspecified trimester: Secondary | ICD-10-CM

## 2023-06-02 NOTE — Progress Notes (Signed)
 Subjective:  Judy Wood is a 28 y.o. G3P2002 at [redacted]w[redacted]d being seen today for prenatal care.  Patient reports no complaints.  Contractions: Not present.  Vag. Bleeding: None. Movement: Present. Denies leaking of fluid.   The following portions of the patient's history were reviewed and updated as appropriate: allergies, current medications, past family history, past medical history, past social history, past surgical history and problem list.   Objective:   Vitals:   06/02/23 0849  BP: 119/79  Pulse: 96  Weight: 287 lb (130.2 kg)    Fetal Status: Fetal Heart Rate (bpm): 130   Movement: Present     General:  Alert, oriented and cooperative. Patient is in no acute distress.  Skin: Skin is warm and dry. No rash noted.   Cardiovascular: Normal heart rate noted  Respiratory: Normal respiratory effort, no problems with respiration noted  Abdomen: Soft, gravid, appropriate for gestational age. Pain/Pressure: Absent     Vaginal: Vag. Bleeding: None.       Cervix: Not evaluated        Extremities: Normal range of motion.  Edema: None  Mental Status: Normal mood and affect. Normal behavior. Normal judgment and thought content.   Urinalysis:      Assessment and Plan:  Pregnancy: G3P2002 at [redacted]w[redacted]d  1. [redacted] weeks gestation of pregnancy (Primary) No complaints today. Continue to monitor  2. History of cesarean section followed by vaginal birth (VBAC)   3. Preexisting hypertension complicating pregnancy, antepartum - Today's BP was not elevated. She did not take for labetalol today but has been taking it regularly. She has not had any issue with the medication - Continue taking labetalol 800mg  three times a day  4. Supervision of high risk pregnancy, antepartum - GC/Chlamydia probe amp ([place])not at [facility] - Culture, beta strep (group b only)  Preterm labor symptoms and general obstetric precautions including but not limited to vaginal bleeding, contractions, leaking of fluid and fetal  movement were reviewed in detail with the patient. Please refer to After Visit Summary for other counseling recommendations.  Return in about 1 week (around 06/09/2023).   Adam Phenix, MD

## 2023-06-05 ENCOUNTER — Ambulatory Visit (INDEPENDENT_AMBULATORY_CARE_PROVIDER_SITE_OTHER): Payer: Medicaid Other

## 2023-06-05 ENCOUNTER — Other Ambulatory Visit: Payer: Self-pay

## 2023-06-05 DIAGNOSIS — O99213 Obesity complicating pregnancy, third trimester: Secondary | ICD-10-CM | POA: Diagnosis not present

## 2023-06-05 DIAGNOSIS — O10913 Unspecified pre-existing hypertension complicating pregnancy, third trimester: Secondary | ICD-10-CM | POA: Diagnosis not present

## 2023-06-05 DIAGNOSIS — Z3A34 34 weeks gestation of pregnancy: Secondary | ICD-10-CM

## 2023-06-05 LAB — GC/CHLAMYDIA PROBE AMP (~~LOC~~) NOT AT ARMC
Chlamydia: NEGATIVE
Comment: NEGATIVE
Comment: NORMAL
Neisseria Gonorrhea: NEGATIVE

## 2023-06-06 LAB — CULTURE, BETA STREP (GROUP B ONLY): Strep Gp B Culture: NEGATIVE

## 2023-06-08 ENCOUNTER — Ambulatory Visit

## 2023-06-08 ENCOUNTER — Telehealth (HOSPITAL_COMMUNITY): Payer: Self-pay | Admitting: *Deleted
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
## 2023-06-08 NOTE — Telephone Encounter (Signed)
 Preadmission screen

## 2023-06-09 ENCOUNTER — Other Ambulatory Visit: Payer: Self-pay

## 2023-06-09 ENCOUNTER — Encounter: Payer: Self-pay | Admitting: Obstetrics and Gynecology

## 2023-06-09 ENCOUNTER — Ambulatory Visit (INDEPENDENT_AMBULATORY_CARE_PROVIDER_SITE_OTHER): Admitting: Obstetrics and Gynecology

## 2023-06-09 VITALS — BP 174/115 | HR 108 | Wt 296.3 lb

## 2023-06-09 DIAGNOSIS — O99013 Anemia complicating pregnancy, third trimester: Secondary | ICD-10-CM | POA: Diagnosis not present

## 2023-06-09 DIAGNOSIS — Z98891 History of uterine scar from previous surgery: Secondary | ICD-10-CM | POA: Diagnosis not present

## 2023-06-09 DIAGNOSIS — O0993 Supervision of high risk pregnancy, unspecified, third trimester: Secondary | ICD-10-CM

## 2023-06-09 DIAGNOSIS — A528 Late syphilis, latent: Secondary | ICD-10-CM

## 2023-06-09 DIAGNOSIS — Z3A35 35 weeks gestation of pregnancy: Secondary | ICD-10-CM | POA: Diagnosis not present

## 2023-06-09 DIAGNOSIS — F419 Anxiety disorder, unspecified: Secondary | ICD-10-CM

## 2023-06-09 DIAGNOSIS — D271 Benign neoplasm of left ovary: Secondary | ICD-10-CM | POA: Diagnosis not present

## 2023-06-09 DIAGNOSIS — O99213 Obesity complicating pregnancy, third trimester: Secondary | ICD-10-CM

## 2023-06-09 DIAGNOSIS — O99343 Other mental disorders complicating pregnancy, third trimester: Secondary | ICD-10-CM

## 2023-06-09 DIAGNOSIS — O10919 Unspecified pre-existing hypertension complicating pregnancy, unspecified trimester: Secondary | ICD-10-CM

## 2023-06-09 DIAGNOSIS — O10913 Unspecified pre-existing hypertension complicating pregnancy, third trimester: Secondary | ICD-10-CM | POA: Diagnosis not present

## 2023-06-09 DIAGNOSIS — Z8759 Personal history of other complications of pregnancy, childbirth and the puerperium: Secondary | ICD-10-CM | POA: Diagnosis not present

## 2023-06-09 DIAGNOSIS — O0990 Supervision of high risk pregnancy, unspecified, unspecified trimester: Secondary | ICD-10-CM

## 2023-06-12 ENCOUNTER — Other Ambulatory Visit: Payer: Medicaid Other

## 2023-06-12 DIAGNOSIS — S92112B Displaced fracture of neck of left talus, initial encounter for open fracture: Secondary | ICD-10-CM | POA: Diagnosis not present

## 2023-06-12 DIAGNOSIS — S82892S Other fracture of left lower leg, sequela: Secondary | ICD-10-CM | POA: Diagnosis not present

## 2023-06-12 DIAGNOSIS — F4310 Post-traumatic stress disorder, unspecified: Secondary | ICD-10-CM | POA: Diagnosis not present

## 2023-06-12 DIAGNOSIS — S82831A Other fracture of upper and lower end of right fibula, initial encounter for closed fracture: Secondary | ICD-10-CM | POA: Diagnosis not present

## 2023-06-13 ENCOUNTER — Telehealth: Payer: Self-pay

## 2023-06-13 ENCOUNTER — Telehealth (HOSPITAL_COMMUNITY): Payer: Self-pay | Admitting: *Deleted

## 2023-06-13 NOTE — Telephone Encounter (Signed)
 Incoming fax from [employer] stated the Labetalol 400mg  not commerically available at [facility], please resend to another pharmacy or change the strength.

## 2023-06-13 NOTE — Telephone Encounter (Signed)
 Preadmission screen

## 2023-06-14 ENCOUNTER — Other Ambulatory Visit: Payer: Self-pay

## 2023-06-14 ENCOUNTER — Encounter: Payer: Self-pay | Admitting: Lactation Services

## 2023-06-14 ENCOUNTER — Encounter (HOSPITAL_COMMUNITY): Payer: Self-pay | Admitting: *Deleted

## 2023-06-14 ENCOUNTER — Ambulatory Visit

## 2023-06-14 ENCOUNTER — Other Ambulatory Visit (INDEPENDENT_AMBULATORY_CARE_PROVIDER_SITE_OTHER): Payer: Self-pay | Admitting: Advanced Practice Midwife

## 2023-06-14 ENCOUNTER — Telehealth (HOSPITAL_COMMUNITY): Payer: Self-pay | Admitting: *Deleted

## 2023-06-14 DIAGNOSIS — O10913 Unspecified pre-existing hypertension complicating pregnancy, third trimester: Secondary | ICD-10-CM | POA: Diagnosis not present

## 2023-06-14 DIAGNOSIS — O99213 Obesity complicating pregnancy, third trimester: Secondary | ICD-10-CM

## 2023-06-14 DIAGNOSIS — Z3A35 35 weeks gestation of pregnancy: Secondary | ICD-10-CM

## 2023-06-14 MED ORDER — LABETALOL HCL 200 MG PO TABS: 800.0000 mg | ORAL_TABLET | Freq: Three times a day (TID) | ORAL | 3 refills | Status: DC

## 2023-06-14 NOTE — Telephone Encounter (Signed)
 Ordered Labetalol dosage per Dr. Thurmon [place] of 800 mg TID. Called Pharmacy to inform them to cancel the 400 mg Rx that was previously sent in. Spoke with [facility] and transferred to Pharmacist, [employer].

## 2023-06-14 NOTE — Telephone Encounter (Signed)
 Preadmission screen

## 2023-06-15 ENCOUNTER — Ambulatory Visit: Admitting: Obstetrics and Gynecology

## 2023-06-15 VITALS — BP 129/76 | HR 97 | Wt 299.2 lb

## 2023-06-15 DIAGNOSIS — Z98891 History of uterine scar from previous surgery: Secondary | ICD-10-CM | POA: Diagnosis not present

## 2023-06-15 DIAGNOSIS — O0993 Supervision of high risk pregnancy, unspecified, third trimester: Secondary | ICD-10-CM

## 2023-06-15 DIAGNOSIS — O10913 Unspecified pre-existing hypertension complicating pregnancy, third trimester: Secondary | ICD-10-CM

## 2023-06-15 DIAGNOSIS — O10919 Unspecified pre-existing hypertension complicating pregnancy, unspecified trimester: Secondary | ICD-10-CM

## 2023-06-15 DIAGNOSIS — Z8759 Personal history of other complications of pregnancy, childbirth and the puerperium: Secondary | ICD-10-CM | POA: Diagnosis not present

## 2023-06-15 DIAGNOSIS — Z3A35 35 weeks gestation of pregnancy: Secondary | ICD-10-CM | POA: Diagnosis not present

## 2023-06-15 DIAGNOSIS — O99213 Obesity complicating pregnancy, third trimester: Secondary | ICD-10-CM | POA: Diagnosis not present

## 2023-06-15 DIAGNOSIS — O0990 Supervision of high risk pregnancy, unspecified, unspecified trimester: Secondary | ICD-10-CM

## 2023-06-15 DIAGNOSIS — Z6841 Body Mass Index (BMI) 40.0 and over, adult: Secondary | ICD-10-CM
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
## 2023-06-19 ENCOUNTER — Ambulatory Visit: Payer: Medicaid Other | Attending: Obstetrics and Gynecology

## 2023-06-19 ENCOUNTER — Ambulatory Visit: Payer: Medicaid Other

## 2023-06-19 ENCOUNTER — Other Ambulatory Visit: Payer: Self-pay | Admitting: Maternal & Fetal Medicine

## 2023-06-19 ENCOUNTER — Ambulatory Visit (HOSPITAL_BASED_OUTPATIENT_CLINIC_OR_DEPARTMENT_OTHER): Admitting: Obstetrics and Gynecology

## 2023-06-19 VITALS — BP 138/91 | HR 96

## 2023-06-19 DIAGNOSIS — Z3A36 36 weeks gestation of pregnancy: Secondary | ICD-10-CM

## 2023-06-19 DIAGNOSIS — O98113 Syphilis complicating pregnancy, third trimester: Secondary | ICD-10-CM

## 2023-06-19 DIAGNOSIS — O10013 Pre-existing essential hypertension complicating pregnancy, third trimester: Secondary | ICD-10-CM | POA: Diagnosis not present

## 2023-06-19 DIAGNOSIS — O99210 Obesity complicating pregnancy, unspecified trimester: Secondary | ICD-10-CM | POA: Insufficient documentation

## 2023-06-19 DIAGNOSIS — O99213 Obesity complicating pregnancy, third trimester: Secondary | ICD-10-CM | POA: Insufficient documentation

## 2023-06-19 DIAGNOSIS — Z6841 Body Mass Index (BMI) 40.0 and over, adult: Secondary | ICD-10-CM | POA: Diagnosis not present

## 2023-06-19 DIAGNOSIS — O34219 Maternal care for unspecified type scar from previous cesarean delivery: Secondary | ICD-10-CM

## 2023-06-19 DIAGNOSIS — O10919 Unspecified pre-existing hypertension complicating pregnancy, unspecified trimester: Secondary | ICD-10-CM | POA: Diagnosis not present

## 2023-06-19 DIAGNOSIS — O09293 Supervision of pregnancy with other poor reproductive or obstetric history, third trimester: Secondary | ICD-10-CM | POA: Diagnosis not present

## 2023-06-19 DIAGNOSIS — Z98891 History of uterine scar from previous surgery: Secondary | ICD-10-CM | POA: Diagnosis not present

## 2023-06-19 DIAGNOSIS — O10913 Unspecified pre-existing hypertension complicating pregnancy, third trimester: Secondary | ICD-10-CM | POA: Diagnosis not present

## 2023-06-19 DIAGNOSIS — O0990 Supervision of high risk pregnancy, unspecified, unspecified trimester: Secondary | ICD-10-CM | POA: Diagnosis not present

## 2023-06-19 DIAGNOSIS — E669 Obesity, unspecified: Secondary | ICD-10-CM | POA: Diagnosis not present

## 2023-06-19 NOTE — Progress Notes (Signed)
 Maternal-Fetal Medicine Consultation Name: Khalila Buechner MRN: [id number]  Chronic hypertension.  Patient takes labetalol  200 mg 3 times daily.  Blood pressures today at our office where 138/91 and 157/93 mmHg.   Patient does not have signs and symptoms of severe features of preeclampsia. History of placental abruption.  Patient will be undergoing induction of labor on 06/23/23 Patient returned for fetal growth assessment and antenatal testing.  Ultrasound Fetal growth is appropriate for gestational age.  Amniotic fluid is normal and good fetal activity seen.  Cephalic presentation.  Antenatal testing is reassuring.  BPP 8/8.  I discussed mild hypertension.  Patient reports she has blood pressure cuff at her mother's place and can check her blood pressures regularly.  I discussed normal parameters and advised her to come to the hospital if systolic blood pressure goes above 160 mm Hg, and/or diastolic blood pressures are higher at the end of 110 mmHg.  Patient reports she smokes cigarettes.  We discussed that smoking is a risk factor for placental abruption.   Consultation including face-to-face (more than 50%) counseling 20 minutes.

## 2023-06-21 ENCOUNTER — Encounter (HOSPITAL_COMMUNITY): Payer: Self-pay | Admitting: Obstetrics and Gynecology

## 2023-06-21 ENCOUNTER — Inpatient Hospital Stay (HOSPITAL_COMMUNITY)
Admission: AD | Admit: 2023-06-21 | Discharge: 2023-06-21 | Disposition: A | Discharge status: Home / Self Care | Attending: Obstetrics and Gynecology | Admitting: Obstetrics and Gynecology

## 2023-06-21 ENCOUNTER — Ambulatory Visit: Admitting: Obstetrics and Gynecology

## 2023-06-21 VITALS — BP 164/100 | HR 106 | Wt 302.4 lb

## 2023-06-21 DIAGNOSIS — Z98891 History of uterine scar from previous surgery: Secondary | ICD-10-CM

## 2023-06-21 DIAGNOSIS — A528 Late syphilis, latent: Secondary | ICD-10-CM | POA: Diagnosis not present

## 2023-06-21 DIAGNOSIS — O99013 Anemia complicating pregnancy, third trimester: Secondary | ICD-10-CM

## 2023-06-21 DIAGNOSIS — O10913 Unspecified pre-existing hypertension complicating pregnancy, third trimester: Secondary | ICD-10-CM

## 2023-06-21 DIAGNOSIS — Z6841 Body Mass Index (BMI) 40.0 and over, adult: Secondary | ICD-10-CM | POA: Diagnosis not present

## 2023-06-21 DIAGNOSIS — O99213 Obesity complicating pregnancy, third trimester: Secondary | ICD-10-CM | POA: Diagnosis not present

## 2023-06-21 DIAGNOSIS — O0990 Supervision of high risk pregnancy, unspecified, unspecified trimester: Secondary | ICD-10-CM

## 2023-06-21 DIAGNOSIS — Z8759 Personal history of other complications of pregnancy, childbirth and the puerperium: Secondary | ICD-10-CM

## 2023-06-21 DIAGNOSIS — O10919 Unspecified pre-existing hypertension complicating pregnancy, unspecified trimester: Secondary | ICD-10-CM

## 2023-06-21 DIAGNOSIS — Z3A36 36 weeks gestation of pregnancy: Secondary | ICD-10-CM | POA: Diagnosis not present

## 2023-06-21 DIAGNOSIS — O163 Unspecified maternal hypertension, third trimester: Secondary | ICD-10-CM

## 2023-06-21 DIAGNOSIS — O0993 Supervision of high risk pregnancy, unspecified, third trimester: Secondary | ICD-10-CM | POA: Diagnosis not present

## 2023-06-21 DIAGNOSIS — O10013 Pre-existing essential hypertension complicating pregnancy, third trimester: Secondary | ICD-10-CM | POA: Insufficient documentation

## 2023-06-21 LAB — CBC
HCT: 31.2 % — ABNORMAL LOW (ref 36.0–46.0)
Hemoglobin: 10.3 g/dL — ABNORMAL LOW (ref 12.0–15.0)
MCH: 30.8 pg (ref 26.0–34.0)
MCHC: 33 g/dL (ref 30.0–36.0)
MCV: 93.4 fL (ref 80.0–100.0)
Platelets: 302 10*3/uL (ref 150–400)
RBC: 3.34 MIL/uL — ABNORMAL LOW (ref 3.87–5.11)
RDW: 12.3 % (ref 11.5–15.5)
WBC: 7.3 10*3/uL (ref 4.0–10.5)
nRBC: 0 % (ref 0.0–0.2)

## 2023-06-21 LAB — COMPREHENSIVE METABOLIC PANEL WITH GFR
ALT: 13 U/L (ref 0–44)
AST: 18 U/L (ref 15–41)
Albumin: 2.9 g/dL — ABNORMAL LOW (ref 3.5–5.0)
Alkaline Phosphatase: 76 U/L (ref 38–126)
Anion gap: 11 (ref 5–15)
BUN: 5 mg/dL — ABNORMAL LOW (ref 6–20)
CO2: 21 mmol/L — ABNORMAL LOW (ref 22–32)
Calcium: 9 mg/dL (ref 8.9–10.3)
Chloride: 103 mmol/L (ref 98–111)
Creatinine, Ser: 0.54 mg/dL (ref 0.44–1.00)
GFR, Estimated: >60 mL/min (ref >60)
Glucose, Bld: 97 mg/dL (ref 70–99)
Potassium: 3.8 mmol/L (ref 3.5–5.1)
Sodium: 135 mmol/L (ref 135–145)
Total Bilirubin: 0.4 mg/dL (ref 0.0–1.2)
Total Protein: 6.5 g/dL (ref 6.5–8.1)

## 2023-06-21 LAB — PROTEIN / CREATININE RATIO, URINE
Creatinine, Urine: 295 mg/dL
Protein Creatinine Ratio: 0.14 mg/mg{creat} (ref 0.00–0.15)
Total Protein, Urine: 40 mg/dL

## 2023-06-21 MED ORDER — LABETALOL HCL 200 MG PO TABS: 400.0000 mg | ORAL_TABLET | Freq: Two times a day (BID) | ORAL | Status: DC

## 2023-06-21 NOTE — MAU Note (Addendum)
 MAU Triage Note:  .Judy Wood is a 28 y.o. at [redacted]w[redacted]d here in MAU reporting: sent from office for PEC. Scheduled for IOL on Friday. Denies VB or LOF. Reports +FM. Denies PIH symptoms at this time, but did have a headache yesterday.   PIH Assessment: Headache present: No  Visual disturbances: None RUQ pain/Epigastric: None Atypical edema: None Hx of HBP: CHTN BP Medications:  procardia  and labetalol   Pain Score: 0-No pain    Onset of complaint: on-going LMP: No LMP recorded (lmp unknown). Patient is pregnant.  Vitals:   06/21/23 1539  BP: (!) 155/90  Pulse: 89  Resp: 20  Temp: 98 F (36.7 C)  SpO2: 98%    FHT:  Fetal Heart Rate Mode: Doppler Baseline Rate (A): 147 bpm Multiple birth?: No Lab orders placed from triage: UA

## 2023-06-21 NOTE — Discharge Instructions (Signed)
 Starting tonight, take 2 tablets of labetalol  (400 mg) twice each day. Continue taking one tablet of nifedipine  twice each day.  If you get another headache, your vision gets worse, or you start having belly pain, come back to the MAU. If your blood pressure at home is more than 160/110, come back to the MAU.
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
## 2023-06-21 NOTE — MAU Provider Note (Signed)
 Chief Complaint:  Hypertension   HPI   None     Judy Wood is a 28 y.o. G3P2002 at [redacted]w[redacted]d who presents to maternity admissions for elevated blood pressures. She had severe range BP in the office and noted worrisome associated symptoms. Associated symptoms include headache and visual disturbances. Headache started yesterday, eventually resolved around 0700 today. Vision symptoms include intermittent blurred vision. She has a history of chronic HTN and BMI >30 that increases the risk of preeclampsia. Still no headache today, she is still experiencing blurred vision.   Pregnancy Course: Receives care at [facility]. Prenatal records reviewed. History of placental abruption at 37 weeks in previous pregnancy. History of chronic HTN, prescribed labetalol  800 mg TID, nifedipine  60 mg BID. She reports that she took one tablet labetalol  (200 mg total) and one tablet nifedipine  (60 mg total) today at around 1200. She has been taking labetalol  200 mg BID and nifedipine  60 mg BID. She reports that her medication was making her sick and that the dose was lowered. Office visits note that the recommendation was to discontinue nifedipine  but continue labetalol  800 mg TID.  Past Medical History:  Diagnosis Date   Asthma    pt states she took albuterol  inhaler lately   Depression    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   Fracture of right proximal fibula 10/06/2022   H/O umbilical hernia repair    Hernia, umbilical    Hypertension    on meds   Hypertension    Obesity    Open dislocation of left talus 10/06/2022   Syphilis 08/30/2014   Treated   OB History  Gravida Para Term Preterm AB Living  3 2 2  0 0 2  SAB IAB Ectopic Multiple Live Births  0 0 0 0 2    # Outcome Date GA Lbr Len/2nd Weight Sex Type Anes PTL Lv  3 Current           2 Term 10/13/14 [redacted]w[redacted]d 07:45 / 00:06 2534 g M Vag-Spont EPI  LIV  1 Term 05/19/12 [redacted]w[redacted]d  2889 g F CS-LTranv EPI  LIV   Past Surgical History:  Procedure  Laterality Date   CESAREAN SECTION N/A 05/19/2012   Procedure:  Primary CESAREAN SECTION  of baby girl  at 1933  APGAR 4/5/5;  Surgeon: Tresia Fruit, MD;  Location: [facility];  Service: Obstetrics;  Laterality: N/A;   EXTERNAL FIXATION REMOVAL Left 09/30/2022   Procedure: REMOVAL EXTERNAL FIXATION LEG;  Surgeon: Laneta Pintos, MD;  Location: [facility] OR;  Service: Orthopedics;  Laterality: Left;   I & D EXTREMITY Left 09/28/2022   Procedure: IRRIGATION AND DEBRIDEMENT EXTREMITY;  Surgeon: Laneta Pintos, MD;  Location: [facility] OR;  Service: Orthopedics;  Laterality: Left;   OPEN REDUCTION INTERNAL FIXATION (ORIF) FOOT LISFRANC FRACTURE Left 09/28/2022   Procedure: External fixation of left ankle;  Surgeon: Laneta Pintos, MD;  Location: [facility] OR;  Service: Orthopedics;  Laterality: Left;   ORIF CALCANEOUS FRACTURE Left 09/30/2022   Procedure: OPEN REDUCTION INTERNAL FIXATION (ORIF) TALUS FRACTURE;  Surgeon: Laneta Pintos, MD;  Location: [facility] OR;  Service: Orthopedics;  Laterality: Left;   UMBILICAL HERNIA REPAIR     as a child   UMBILICAL HERNIA REPAIR     Family History  Problem Relation Age of Onset   Hypertension Mother    Anemia Mother    Depression Mother    Deep vein thrombosis Maternal Grandmother    Diabetes Maternal Grandmother  Kidney disease Maternal Grandmother    Cancer Maternal Grandmother        colon cancer   Diabetes Father    Social History   Tobacco Use   Smoking status: Some Days    Current packs/day: 0.25    Average packs/day: 0.3 packs/day for 15.1 years (3.8 ttl pk-yrs)    Types: Cigarettes    Start date: 05/2008   Smokeless tobacco: Never  Vaping Use   Vaping status: Never Used  Substance Use Topics   Alcohol use: Not Currently   Drug use: Not Currently    Types: Marijuana, Other-see comments    Comment: Stopped when she found out she was pregnant    Allergies  Allergen Reactions   Banana Anaphylaxis   Lactose Intolerance (Gi) Diarrhea and Nausea Only   Sulfa  Antibiotics Other (See Comments)    Childhood reaction   Medications Prior to Admission  Medication Sig Dispense Refill Last Dose/Taking   acetaminophen  (TYLENOL ) 500 MG tablet Take 1,000 mg by mouth every 8 (eight) hours as needed for headache.   06/21/2023 at  7:00 AM   aspirin  EC 81 MG tablet Take 2 tablets (162 mg total) by mouth at bedtime. Start taking when you are [redacted] weeks pregnant for rest of pregnancy for prevention of preeclampsia 300 tablet 2 06/21/2023 at 12:00 PM   cyclobenzaprine  (FLEXERIL ) 10 MG tablet Take 1 tablet (10 mg total) by mouth every 8 (eight) hours as needed for muscle spasms. 30 tablet 1 Past Week   emtricitabine -tenofovir  (TRUVADA) 200-300 MG tablet Take 1 tablet by mouth daily. 90 tablet 3 06/21/2023 at 12:00 PM   hydrOXYzine  (ATARAX ) 25 MG tablet Take 1 tablet (25 mg total) by mouth every 6 (six) hours as needed for anxiety. 30 tablet 0 Past Month   NIFEdipine  (ADALAT  CC) 60 MG 24 hr tablet Take 60 mg by mouth in the morning and at bedtime.   06/21/2023 at 12:00 PM   Prenatal 28-0.8 MG TABS Take 1 tablet by mouth daily. 30 tablet 12 06/21/2023 at 12:00 PM   sertraline  (ZOLOFT ) 50 MG tablet Starting 05/14/23, Take 1 tablet (50 mg total) by mouth daily. 30 tablet 1 06/21/2023 at 12:00 PM   [DISCONTINUED] labetalol  (NORMODYNE ) 200 MG tablet Take 4 tablets (800 mg total) by mouth 3 (three) times daily. 360 tablet 3 06/21/2023 at 12:00 PM   albuterol  (VENTOLIN  HFA) 108 (90 Base) MCG/ACT inhaler Inhale 1-2 puffs into the lungs every 6 (six) hours as needed for wheezing or shortness of breath. 6.7 g 0 More than a month   Blood Pressure Monitoring (BLOOD PRESSURE KIT) DEVI 1 Device by Does not apply route daily. 1 each 0     I have reviewed patient's Past Medical Hx, Surgical Hx, Family Hx, Social Hx, medications and allergies.   ROS  Pertinent items noted in HPI and remainder of comprehensive ROS otherwise negative.   PHYSICAL EXAM  Patient Vitals for the past 24 hrs:  BP  Temp Temp src Pulse Resp SpO2  06/21/23 2000 (!) 146/93 -- -- 97 -- 98 %  06/21/23 1946 (!) 142/96 -- -- 96 -- 99 %  06/21/23 1933 119/78 -- -- (!) 109 -- 98 %  06/21/23 1915 (!) 158/95 -- -- 99 -- 99 %  06/21/23 1907 (!) 163/96 -- -- 95 -- --  06/21/23 1902 -- -- -- (!) 101 -- --  06/21/23 1847 (!) 155/100 -- -- 96 -- --  06/21/23 1831 (!) 157/104 -- -- 98 -- --  06/21/23 1817 135/75 -- -- 97 -- --  06/21/23 1816 135/75 -- -- 97 -- --  06/21/23 1802 135/80 -- -- 99 -- --  06/21/23 1747 131/80 -- -- 97 -- --  06/21/23 1732 (!) 122/103 -- -- 95 -- --  06/21/23 1701 133/88 -- -- 92 -- --  06/21/23 1700 -- -- -- -- -- 98 %  06/21/23 1646 118/75 -- -- 93 -- --  06/21/23 1645 -- -- -- -- -- 98 %  06/21/23 1631 137/89 -- -- 96 -- --  06/21/23 1630 -- -- -- -- -- 98 %  06/21/23 1616 (!) 143/92 -- -- 91 18 99 %  06/21/23 1610 126/89 -- -- 94 -- (!) 85 %  06/21/23 1539 (!) 155/90 98 F (36.7 C) Oral 89 20 98 %   Office Visit Vitals:    06/21/23 1348 06/21/23 1423  BP: (!) 163/121 (!) 164/100  Pulse: (!) 106    Weight: (!) 302 lb 6.4 oz (137.2 kg)      Constitutional: Well-developed, well-nourished female in no acute distress.  Cardiovascular: normal rate & rhythm, warm and well-perfused Respiratory: normal effort, no problems with respiration noted GI: Abd soft, non-tender, non-distended MSK: Extremities nontender, no edema, normal ROM Skin: warm and dry. Acyanotic, no jaundice or pallor. Neurologic: Alert and oriented x 4. No abnormal coordination. Psychiatric: Normal mood. Speech not slurred, not rapid/pressured. Patient is cooperative. GU: no CVA tenderness  Fetal Tracing: Baseline FHR: 140 per minute Fetal heart variability: moderate Fetal Heart Rate accelerations: yes Fetal Heart Rate decelerations: none Fetal Non-stress Test: reactive Toco: uterine irritability  Labs: Results for orders placed or performed during the hospital encounter of 06/21/23 (from the past 24  hours)  CBC     Status: Abnormal   Collection Time: 06/21/23  4:10 PM  Result Value Ref Range   WBC 7.3 4.0 - 10.5 K/uL   RBC 3.34 (L) 3.87 - 5.11 MIL/uL   Hemoglobin 10.3 (L) 12.0 - 15.0 g/dL   HCT 18.8 (L) 41.6 - 60.6 %   MCV 93.4 80.0 - 100.0 fL   MCH 30.8 26.0 - 34.0 pg   MCHC 33.0 30.0 - 36.0 g/dL   RDW 30.1 60.1 - 09.3 %   Platelets 302 150 - 400 K/uL   nRBC 0.0 0.0 - 0.2 %  Comprehensive metabolic panel with GFR     Status: Abnormal   Collection Time: 06/21/23  4:10 PM  Result Value Ref Range   Sodium 135 135 - 145 mmol/L   Potassium 3.8 3.5 - 5.1 mmol/L   Chloride 103 98 - 111 mmol/L   CO2 21 (L) 22 - 32 mmol/L   Glucose, Bld 97 70 - 99 mg/dL   BUN 5 (L) 6 - 20 mg/dL   Creatinine, Ser 2.35 0.44 - 1.00 mg/dL   Calcium  9.0 8.9 - 10.3 mg/dL   Total Protein 6.5 6.5 - 8.1 g/dL   Albumin 2.9 (L) 3.5 - 5.0 g/dL   AST 18 15 - 41 U/L   ALT 13 0 - 44 U/L   Alkaline Phosphatase 76 38 - 126 U/L   Total Bilirubin 0.4 0.0 - 1.2 mg/dL   GFR, Estimated >57 >32 mL/min   Anion gap 11 5 - 15  Protein / creatinine ratio, urine     Status: None   Collection Time: 06/21/23  5:17 PM  Result Value Ref Range   Creatinine, Urine 295 mg/dL   Total Protein, Urine 40 mg/dL   Protein  Creatinine Ratio 0.14 0.00 - 0.15 mg/mg[Cre]    Imaging:  No results found.  MDM & MAU COURSE  MDM: High  MAU Course: -Sent to MAU for elevated blood pressures, preeclampsia rule out. -NST. Monitor blood pressure for any severe range. If BP >160/110, will initiate antihypertensives. -CBC, CMP, urine protein/creatinine for preeclampsia. -CBC with stable anemia, no thrombocytopenia or thrombocytosis. -CMP within normal limits for pregnancy, liver enzymes normal. -Patient remains headache free but still has blurry vision. -Urine protein/creatinine ratio within normal limits. -BP borderline severe range while in MAU. -Consulted with Dr. Aquilla Knapp, does not meet diagnostic criteria for preeclampsia. Recommends  24 hour observation on OB specialty care.  Differential diagnosis considered for elevated blood pressure includes but is not limited to: preeclampsia, preeclampsia with severe features, HELLP syndrome  Orders Placed This Encounter  Procedures   CBC   Comprehensive metabolic panel with GFR   Protein / creatinine ratio, urine   Maintain IV access   Discharge patient   Meds ordered this encounter  Medications   labetalol  (NORMODYNE ) 200 MG tablet    Sig: Take 2 tablets (400 mg total) by mouth 2 (two) times daily.    ASSESSMENT   1. Preexisting hypertension complicating pregnancy, antepartum   2. Elevated blood pressure affecting pregnancy in third trimester, antepartum   3. [redacted] weeks gestation of pregnancy     PLAN  Discussed recommendation for 24 hour observation on OB specialty care due to borderline severe-range blood pressures. Discussed risks of high blood pressure and potential to progress to preeclampsia or increase risk of abruption. Patient declines admitting for observation. Discussed increasing dose of labetalol  starting tonight to 400 mg BID and continue nifedipine  60 mg BID. Recommend close BP monitoring at home, low threshold to come back to MAU for signs/symptoms of preeclampsia, and BP check in the office tomorrow. Patient is agreeable to this plan. Dr. Aquilla Knapp will send message to office to schedule BP visit for tomorrow. IOL scheduled 06/23/2023.  Discharge home with strict return precautions. Patient verbalized understanding. Teach-back technique confirmed understanding of labetalol  400 mg BID and nifedipine  60 mg BID.    Follow-up Information     [facility] Follow up in 1 day(s).   Specialty: Obstetrics and Gynecology Why: FOR BLOOD PRESSURE CHECK Contact information: [address] [place]  [id number] [phone]                 Allergies as of 06/21/2023       Reactions    Banana Anaphylaxis   Lactose Intolerance (gi) Diarrhea, Nausea Only   Sulfa Antibiotics Other (See Comments)   Childhood reaction        Medication List     TAKE these medications    acetaminophen  500 MG tablet Commonly known as: TYLENOL  Take 1,000 mg by mouth every 8 (eight) hours as needed for headache.   albuterol  108 (90 Base) MCG/ACT inhaler Commonly known as: VENTOLIN  HFA Inhale 1-2 puffs into the lungs every 6 (six) hours as needed for wheezing or shortness of breath.   aspirin  EC 81 MG tablet Take 2 tablets (162 mg total) by mouth at bedtime. Start taking when you are [redacted] weeks pregnant for rest of pregnancy for prevention of preeclampsia   Blood Pressure Kit Devi 1 Device by Does not apply route daily.   cyclobenzaprine  10 MG tablet Commonly known as: FLEXERIL  Take 1 tablet (10 mg total) by mouth every 8 (eight) hours as  needed for muscle spasms.   emtricitabine -tenofovir  200-300 MG tablet Commonly known as: Truvada Take 1 tablet by mouth daily.   hydrOXYzine  25 MG tablet Commonly known as: ATARAX  Take 1 tablet (25 mg total) by mouth every 6 (six) hours as needed for anxiety.   labetalol  200 MG tablet Commonly known as: NORMODYNE  Take 2 tablets (400 mg total) by mouth 2 (two) times daily. What changed:  how much to take when to take this   NIFEdipine  60 MG 24 hr tablet Commonly known as: ADALAT  CC Take 60 mg by mouth in the morning and at bedtime.   Prenatal 28-0.8 MG Tabs Take 1 tablet by mouth daily.   sertraline  50 MG tablet Commonly known as: ZOLOFT  Starting 05/14/23, Take 1 tablet (50 mg total) by mouth daily.        Noreene Bearded, PA

## 2023-06-22 ENCOUNTER — Ambulatory Visit: Admitting: *Deleted

## 2023-06-22 ENCOUNTER — Ambulatory Visit: Admitting: General Practice

## 2023-06-22 VITALS — BP 136/83

## 2023-06-22 VITALS — BP 136/83 | HR 90

## 2023-06-22 DIAGNOSIS — O10913 Unspecified pre-existing hypertension complicating pregnancy, third trimester: Secondary | ICD-10-CM

## 2023-06-22 DIAGNOSIS — O0990 Supervision of high risk pregnancy, unspecified, unspecified trimester: Secondary | ICD-10-CM

## 2023-06-22 DIAGNOSIS — O10919 Unspecified pre-existing hypertension complicating pregnancy, unspecified trimester: Secondary | ICD-10-CM

## 2023-06-22 DIAGNOSIS — Z3A36 36 weeks gestation of pregnancy: Secondary | ICD-10-CM

## 2023-06-22 DIAGNOSIS — Z0130 Encounter for examination of blood pressure without abnormal findings: Secondary | ICD-10-CM

## 2023-06-22 NOTE — Addendum Note (Signed)
 Addended by: Leander Prior on: 06/22/2023 03:16 PM   Modules accepted: Level of Service

## 2023-06-22 NOTE — Progress Notes (Addendum)
 Patient presents to office today for blood pressure check following up from MAU visit yesterday. No headaches, dizziness, or blurry vision. She reports feeling better compared to yesterday. Trace edema noted today. Reports good fetal movement. NST reactive in office today. BP 142/97 & 136/83. Discussed with Dr Racheal Buddle- patient will have IOL tomorrow. Precautions given.  Jewelene Morton RN BSN 06/22/23

## 2023-06-23 ENCOUNTER — Inpatient Hospital Stay (HOSPITAL_COMMUNITY)

## 2023-06-24 ENCOUNTER — Inpatient Hospital Stay (HOSPITAL_COMMUNITY): Admitting: Anesthesiology

## 2023-06-24 ENCOUNTER — Encounter (HOSPITAL_COMMUNITY): Payer: Self-pay | Admitting: Family Medicine

## 2023-06-24 ENCOUNTER — Inpatient Hospital Stay (HOSPITAL_COMMUNITY)
Admission: RE | Admit: 2023-06-24 | Discharge: 2023-06-29 | DRG: 786 | Disposition: A | Discharge status: Home / Self Care | Attending: Family Medicine | Admitting: Family Medicine

## 2023-06-24 DIAGNOSIS — O34211 Maternal care for low transverse scar from previous cesarean delivery: Secondary | ICD-10-CM | POA: Diagnosis present

## 2023-06-24 DIAGNOSIS — O99334 Smoking (tobacco) complicating childbirth: Secondary | ICD-10-CM | POA: Diagnosis not present

## 2023-06-24 DIAGNOSIS — O3663X0 Maternal care for excessive fetal growth, third trimester, not applicable or unspecified: Secondary | ICD-10-CM | POA: Diagnosis not present

## 2023-06-24 DIAGNOSIS — U071 COVID-19: Secondary | ICD-10-CM | POA: Diagnosis present

## 2023-06-24 DIAGNOSIS — O134 Gestational [pregnancy-induced] hypertension without significant proteinuria, complicating childbirth: Secondary | ICD-10-CM | POA: Diagnosis not present

## 2023-06-24 DIAGNOSIS — Z98891 History of uterine scar from previous surgery: Secondary | ICD-10-CM

## 2023-06-24 DIAGNOSIS — O9902 Anemia complicating childbirth: Secondary | ICD-10-CM | POA: Diagnosis not present

## 2023-06-24 DIAGNOSIS — A528 Late syphilis, latent: Secondary | ICD-10-CM | POA: Diagnosis present

## 2023-06-24 DIAGNOSIS — O99214 Obesity complicating childbirth: Secondary | ICD-10-CM | POA: Diagnosis present

## 2023-06-24 DIAGNOSIS — Z8616 Personal history of COVID-19: Secondary | ICD-10-CM

## 2023-06-24 DIAGNOSIS — K219 Gastro-esophageal reflux disease without esophagitis: Secondary | ICD-10-CM | POA: Diagnosis present

## 2023-06-24 DIAGNOSIS — Z3A37 37 weeks gestation of pregnancy: Secondary | ICD-10-CM | POA: Diagnosis not present

## 2023-06-24 DIAGNOSIS — F32A Depression, unspecified: Secondary | ICD-10-CM | POA: Diagnosis present

## 2023-06-24 DIAGNOSIS — Z8759 Personal history of other complications of pregnancy, childbirth and the puerperium: Secondary | ICD-10-CM

## 2023-06-24 DIAGNOSIS — F4310 Post-traumatic stress disorder, unspecified: Secondary | ICD-10-CM | POA: Diagnosis present

## 2023-06-24 DIAGNOSIS — Z8249 Family history of ischemic heart disease and other diseases of the circulatory system: Secondary | ICD-10-CM | POA: Diagnosis not present

## 2023-06-24 DIAGNOSIS — F419 Anxiety disorder, unspecified: Secondary | ICD-10-CM | POA: Diagnosis present

## 2023-06-24 DIAGNOSIS — O1092 Unspecified pre-existing hypertension complicating childbirth: Secondary | ICD-10-CM | POA: Diagnosis not present

## 2023-06-24 DIAGNOSIS — O10919 Unspecified pre-existing hypertension complicating pregnancy, unspecified trimester: Principal | ICD-10-CM | POA: Diagnosis present

## 2023-06-24 DIAGNOSIS — O9962 Diseases of the digestive system complicating childbirth: Secondary | ICD-10-CM | POA: Diagnosis not present

## 2023-06-24 DIAGNOSIS — F17210 Nicotine dependence, cigarettes, uncomplicated: Secondary | ICD-10-CM | POA: Diagnosis not present

## 2023-06-24 DIAGNOSIS — Z833 Family history of diabetes mellitus: Secondary | ICD-10-CM | POA: Diagnosis not present

## 2023-06-24 DIAGNOSIS — O99210 Obesity complicating pregnancy, unspecified trimester: Secondary | ICD-10-CM | POA: Diagnosis present

## 2023-06-24 DIAGNOSIS — O43893 Other placental disorders, third trimester: Secondary | ICD-10-CM | POA: Diagnosis not present

## 2023-06-24 DIAGNOSIS — O99344 Other mental disorders complicating childbirth: Secondary | ICD-10-CM | POA: Diagnosis not present

## 2023-06-24 DIAGNOSIS — O34219 Maternal care for unspecified type scar from previous cesarean delivery: Secondary | ICD-10-CM | POA: Diagnosis not present

## 2023-06-24 DIAGNOSIS — O99013 Anemia complicating pregnancy, third trimester: Secondary | ICD-10-CM | POA: Diagnosis present

## 2023-06-24 DIAGNOSIS — O411230 Chorioamnionitis, third trimester, not applicable or unspecified: Secondary | ICD-10-CM | POA: Diagnosis not present

## 2023-06-24 DIAGNOSIS — O139 Gestational [pregnancy-induced] hypertension without significant proteinuria, unspecified trimester: Secondary | ICD-10-CM | POA: Diagnosis present

## 2023-06-24 DIAGNOSIS — O98512 Other viral diseases complicating pregnancy, second trimester: Secondary | ICD-10-CM | POA: Diagnosis present

## 2023-06-24 DIAGNOSIS — O1002 Pre-existing essential hypertension complicating childbirth: Secondary | ICD-10-CM | POA: Diagnosis not present

## 2023-06-24 DIAGNOSIS — Z79899 Other long term (current) drug therapy: Secondary | ICD-10-CM | POA: Diagnosis not present

## 2023-06-24 DIAGNOSIS — O0990 Supervision of high risk pregnancy, unspecified, unspecified trimester: Secondary | ICD-10-CM

## 2023-06-24 DIAGNOSIS — O411430 Placentitis, third trimester, not applicable or unspecified: Secondary | ICD-10-CM | POA: Diagnosis not present

## 2023-06-24 DIAGNOSIS — Z3A00 Weeks of gestation of pregnancy not specified: Secondary | ICD-10-CM | POA: Diagnosis not present

## 2023-06-24 DIAGNOSIS — Z6841 Body Mass Index (BMI) 40.0 and over, adult: Secondary | ICD-10-CM

## 2023-06-24 LAB — COMPREHENSIVE METABOLIC PANEL WITH GFR
ALT: 13 U/L (ref 0–44)
ALT: 13 U/L (ref 0–44)
AST: 17 U/L (ref 15–41)
AST: 18 U/L (ref 15–41)
Albumin: 3 g/dL — ABNORMAL LOW (ref 3.5–5.0)
Albumin: 2.8 g/dL — ABNORMAL LOW (ref 3.5–5.0)
Alkaline Phosphatase: 82 U/L (ref 38–126)
Alkaline Phosphatase: 71 U/L (ref 38–126)
Anion gap: 12 (ref 5–15)
Anion gap: 15 (ref 5–15)
BUN: <5 mg/dL — ABNORMAL LOW (ref 6–20)
BUN: <5 mg/dL — ABNORMAL LOW (ref 6–20)
CO2: 22 mmol/L (ref 22–32)
CO2: 20 mmol/L — ABNORMAL LOW (ref 22–32)
Calcium: 9.1 mg/dL (ref 8.9–10.3)
Calcium: 9.2 mg/dL (ref 8.9–10.3)
Chloride: 104 mmol/L (ref 98–111)
Chloride: 103 mmol/L (ref 98–111)
Creatinine, Ser: 0.57 mg/dL (ref 0.44–1.00)
Creatinine, Ser: 0.59 mg/dL (ref 0.44–1.00)
GFR, Estimated: >60 mL/min (ref >60)
GFR, Estimated: >60 mL/min (ref >60)
Glucose, Bld: 96 mg/dL (ref 70–99)
Glucose, Bld: 106 mg/dL — ABNORMAL HIGH (ref 70–99)
Potassium: 3.6 mmol/L (ref 3.5–5.1)
Potassium: 3.5 mmol/L (ref 3.5–5.1)
Sodium: 138 mmol/L (ref 135–145)
Sodium: 138 mmol/L (ref 135–145)
Total Bilirubin: 0.4 mg/dL (ref 0.0–1.2)
Total Bilirubin: 0.5 mg/dL (ref 0.0–1.2)
Total Protein: 6.8 g/dL (ref 6.5–8.1)
Total Protein: 6.3 g/dL — ABNORMAL LOW (ref 6.5–8.1)

## 2023-06-24 LAB — CBC
HCT: 31.7 % — ABNORMAL LOW (ref 36.0–46.0)
HCT: 29.8 % — ABNORMAL LOW (ref 36.0–46.0)
Hemoglobin: 10.4 g/dL — ABNORMAL LOW (ref 12.0–15.0)
Hemoglobin: 10 g/dL — ABNORMAL LOW (ref 12.0–15.0)
MCH: 30.3 pg (ref 26.0–34.0)
MCH: 30.7 pg (ref 26.0–34.0)
MCHC: 32.8 g/dL (ref 30.0–36.0)
MCHC: 33.6 g/dL (ref 30.0–36.0)
MCV: 92.4 fL (ref 80.0–100.0)
MCV: 91.4 fL (ref 80.0–100.0)
Platelets: 320 10*3/uL (ref 150–400)
Platelets: 291 10*3/uL (ref 150–400)
RBC: 3.43 MIL/uL — ABNORMAL LOW (ref 3.87–5.11)
RBC: 3.26 MIL/uL — ABNORMAL LOW (ref 3.87–5.11)
RDW: 12.5 % (ref 11.5–15.5)
RDW: 12.6 % (ref 11.5–15.5)
WBC: 6.9 10*3/uL (ref 4.0–10.5)
WBC: 8.8 10*3/uL (ref 4.0–10.5)
nRBC: 0 % (ref 0.0–0.2)
nRBC: 0 % (ref 0.0–0.2)

## 2023-06-24 LAB — TYPE AND SCREEN
ABO/RH(D): A POS
Antibody Screen: NEGATIVE

## 2023-06-24 LAB — PROTEIN / CREATININE RATIO, URINE
Creatinine, Urine: 221 mg/dL
Protein Creatinine Ratio: 0.24 mg/mg{creat} — ABNORMAL HIGH (ref 0.00–0.15)
Total Protein, Urine: 54 mg/dL

## 2023-06-24 MED ORDER — OXYCODONE-ACETAMINOPHEN 5-325 MG PO TABS: 2.0000 | ORAL_TABLET | ORAL | Status: DC | PRN

## 2023-06-24 MED ORDER — EPHEDRINE 5 MG/ML INJ: 10.0000 mg | INTRAVENOUS | Status: DC | PRN

## 2023-06-24 MED ORDER — EMTRICITABINE-TENOFOVIR AF 200-25 MG PO TABS
1.0000 | ORAL_TABLET | Freq: Every day | ORAL | Status: DC
  Administered 2023-06-24 – 2023-06-29 (×6): 1 via ORAL
  Filled 2023-06-24 (×7): qty 1

## 2023-06-24 MED ORDER — BUPIVACAINE HCL (PF) 0.25 % IJ SOLN
INTRAMUSCULAR | Status: DC | PRN
  Administered 2023-06-24: 12 mL via EPIDURAL

## 2023-06-24 MED ORDER — NIFEDIPINE ER OSMOTIC RELEASE 30 MG PO TB24: 60.0000 mg | ORAL_TABLET | Freq: Every day | ORAL | Status: DC

## 2023-06-24 MED ORDER — LABETALOL HCL 5 MG/ML IV SOLN
40.0000 mg | INTRAVENOUS | Status: DC | PRN
  Administered 2023-06-24 (×2): 40 mg via INTRAVENOUS
  Filled 2023-06-24 (×2): qty 8

## 2023-06-24 MED ORDER — ACETAMINOPHEN 325 MG PO TABS
650.0000 mg | ORAL_TABLET | ORAL | Status: DC | PRN
  Administered 2023-06-26 (×2): 650 mg via ORAL
  Filled 2023-06-24 (×2): qty 2

## 2023-06-24 MED ORDER — OXYTOCIN-SODIUM CHLORIDE 30-0.9 UT/500ML-% IV SOLN: 2.5000 [IU]/h | INTRAVENOUS | Status: DC

## 2023-06-24 MED ORDER — OXYTOCIN BOLUS FROM INFUSION: 333.0000 mL | Freq: Once | INTRAVENOUS | Status: DC

## 2023-06-24 MED ORDER — NIFEDIPINE ER OSMOTIC RELEASE 30 MG PO TB24
90.0000 mg | ORAL_TABLET | Freq: Two times a day (BID) | ORAL | Status: DC
  Administered 2023-06-25 – 2023-06-29 (×9): 90 mg via ORAL
  Filled 2023-06-24 (×11): qty 3

## 2023-06-24 MED ORDER — SERTRALINE HCL 50 MG PO TABS
50.0000 mg | ORAL_TABLET | Freq: Every day | ORAL | Status: DC
  Administered 2023-06-24 – 2023-06-29 (×6): 50 mg via ORAL
  Filled 2023-06-24 (×7): qty 1

## 2023-06-24 MED ORDER — OXYTOCIN-SODIUM CHLORIDE 30-0.9 UT/500ML-% IV SOLN
1.0000 m[IU]/min | INTRAVENOUS | Status: DC
Start: 2023-06-24 — End: 2023-06-26
  Administered 2023-06-24: 2 m[IU]/min via INTRAVENOUS
  Administered 2023-06-26: 8 m[IU]/min via INTRAVENOUS
  Filled 2023-06-24: qty 500

## 2023-06-24 MED ORDER — LIDOCAINE HCL (PF) 1 % IJ SOLN
INTRAMUSCULAR | Status: DC | PRN
  Administered 2023-06-24 – 2023-06-26 (×3): 5 mL via EPIDURAL

## 2023-06-24 MED ORDER — HYDRALAZINE HCL 20 MG/ML IJ SOLN: 10.0000 mg | INTRAMUSCULAR | Status: DC | PRN

## 2023-06-24 MED ORDER — LACTATED RINGERS IV SOLN: INTRAVENOUS | Status: AC

## 2023-06-24 MED ORDER — NIFEDIPINE ER OSMOTIC RELEASE 30 MG PO TB24
60.0000 mg | ORAL_TABLET | Freq: Two times a day (BID) | ORAL | Status: DC
Start: 2023-06-24 — End: 2023-06-24
  Administered 2023-06-24 (×2): 60 mg via ORAL
  Filled 2023-06-24: qty 2

## 2023-06-24 MED ORDER — SOD CITRATE-CITRIC ACID 500-334 MG/5ML PO SOLN
30.0000 mL | ORAL | Status: DC | PRN
Start: 2023-06-24 — End: 2023-06-26
  Administered 2023-06-26: 30 mL via ORAL
  Filled 2023-06-24: qty 30

## 2023-06-24 MED ORDER — LABETALOL HCL 5 MG/ML IV SOLN
80.0000 mg | INTRAVENOUS | Status: DC | PRN
  Administered 2023-06-24: 80 mg via INTRAVENOUS
  Filled 2023-06-24: qty 16

## 2023-06-24 MED ORDER — OXYCODONE-ACETAMINOPHEN 5-325 MG PO TABS: 1.0000 | ORAL_TABLET | ORAL | Status: DC | PRN

## 2023-06-24 MED ORDER — ONDANSETRON HCL 4 MG/2ML IJ SOLN: 4.0000 mg | Freq: Four times a day (QID) | INTRAMUSCULAR | Status: DC | PRN

## 2023-06-24 MED ORDER — LABETALOL HCL 5 MG/ML IV SOLN
20.0000 mg | INTRAVENOUS | Status: DC | PRN
  Administered 2023-06-24 (×2): 20 mg via INTRAVENOUS
  Filled 2023-06-24 (×2): qty 4

## 2023-06-24 MED ORDER — TERBUTALINE SULFATE 1 MG/ML IJ SOLN: 0.2500 mg | Freq: Once | INTRAMUSCULAR | Status: DC | PRN

## 2023-06-24 MED ORDER — PHENYLEPHRINE 80 MCG/ML (10ML) SYRINGE FOR IV PUSH (FOR BLOOD PRESSURE SUPPORT): 80.0000 ug | PREFILLED_SYRINGE | INTRAVENOUS | Status: DC | PRN

## 2023-06-24 MED ORDER — FENTANYL-BUPIVACAINE-NACL 0.5-0.125-0.9 MG/250ML-% EP SOLN
12.0000 mL/h | EPIDURAL | Status: DC | PRN
  Administered 2023-06-24 – 2023-06-26 (×3): 12 mL/h via EPIDURAL
  Filled 2023-06-24 (×3): qty 250

## 2023-06-24 MED ORDER — LACTATED RINGERS IV SOLN
500.0000 mL | Freq: Once | INTRAVENOUS | Status: AC
  Administered 2023-06-25: 500 mL via INTRAVENOUS

## 2023-06-24 MED ORDER — LACTATED RINGERS IV SOLN: 500.0000 mL | INTRAVENOUS | Status: AC | PRN

## 2023-06-24 MED ORDER — DIPHENHYDRAMINE HCL 50 MG/ML IJ SOLN: 12.5000 mg | INTRAMUSCULAR | Status: DC | PRN

## 2023-06-24 MED ORDER — LABETALOL HCL 200 MG PO TABS
400.0000 mg | ORAL_TABLET | Freq: Two times a day (BID) | ORAL | Status: DC
  Administered 2023-06-24 (×2): 400 mg via ORAL
  Filled 2023-06-24: qty 2

## 2023-06-24 MED ORDER — LIDOCAINE HCL (PF) 1 % IJ SOLN: 30.0000 mL | INTRAMUSCULAR | Status: DC | PRN

## 2023-06-24 MED ORDER — LABETALOL HCL 200 MG PO TABS
400.0000 mg | ORAL_TABLET | Freq: Three times a day (TID) | ORAL | Status: DC
  Administered 2023-06-25 – 2023-06-29 (×14): 400 mg via ORAL
  Filled 2023-06-24 (×16): qty 2

## 2023-06-24 NOTE — Anesthesia Procedure Notes (Signed)
 Epidural Patient location during procedure: OB Start time: 06/24/2023 8:48 PM End time: 06/24/2023 8:59 PM  Staffing Anesthesiologist: Tura Gaines, MD Performed: anesthesiologist   Preanesthetic Checklist Completed: patient identified, IV checked, site marked, risks and benefits discussed, surgical consent, monitors and equipment checked, pre-op evaluation and timeout performed  Epidural Patient position: sitting Prep: DuraPrep and site prepped and draped Patient monitoring: continuous pulse ox and blood pressure Approach: midline Location: L4-L5 Injection technique: LOR air  Needle:  Needle type: Tuohy  Needle gauge: 17 G Needle length: 9 cm and 9 Needle insertion depth: 8 cm Catheter type: closed end flexible Catheter size: 19 Gauge Catheter at skin depth: 15 cm Test dose: negative and Other  Assessment Events: blood not aspirated, no cerebrospinal fluid, injection not painful, no injection resistance, no paresthesia and negative IV test  Additional Notes Patient identified. Risks and benefits discussed including failed block, incomplete  Pain control, post dural puncture headache, nerve damage, paralysis, blood pressure Changes, nausea, vomiting, reactions to medications-both toxic and allergic and post Partum back pain. All questions were answered. Patient expressed understanding and wished to proceed. Sterile technique was used throughout procedure. Epidural site was Dressed with sterile barrier dressing. No paresthesias, signs of intravascular injection Or signs of intrathecal spread were encountered. Technically difficult due to Obesity and poor positioning. Attempt x 3 Patient was more comfortable after the epidural was dosed. Please see RN's note for documentation of vital signs and FHR which are stable. Reason for block:procedure for pain

## 2023-06-24 NOTE — Progress Notes (Signed)
 Patient ID: Judy Wood, female   DOB: [birth year], 28 y.o.   MRN: [id number]   Subjective: -Care assumed of 28 y.o. G3P2002 at [redacted]w[redacted]d who presents for IOL s/t CHTN. In room to meet acquaintance of patient and family.  Patient reports doing well and comfortable with epidural. Denies HA, visual disturbances, and RUQ pain.   Objective: BP (!) 150/97   Pulse 86   Temp (!) 97.2 F (36.2 C) (Axillary)   Resp 18   Ht 5\' 4"  (1.626 m)   Wt (!) 136.3 kg   LMP  (LMP Unknown)   SpO2 99%   BMI 51.56 kg/m  No intake/output data recorded. No intake/output data recorded.  Fetal Monitoring: FHT: 135 bpm, Mod Var, -Decels, +Accels UC: Palpates moderate    Physical Exam: General appearance: alert, well appearing, and in no distress. Chest: normal rate and regular rhythm.  clear to auscultation, no wheezes, rales or rhonchi, symmetric air entry. Abdominal exam: soft, nontender, nondistended, no masses or organomegaly Gravid. Extremities: +Non pitting edema Skin exam: Warm Dry  Vaginal Exam: SVE:   Dilation: 1 Effacement (%): Thick Station: -3 Exam by:: [employer] Membranes:Intact Internal Monitors: None  Augmentation/Induction: Pitocin : 59mUn/min Cytotec : None  Assessment:  IUP at 37.1 weeks Cat I FT  IOL d/t CHTN BMI 51.56 H/O VBAC Late Latent Syphilis Anemia  Plan: -Foley bulb remains in place. -Discussed continued monitoring until expulsion. -Reviewed plan for AROM and increasing of pitocin  once foley bulb expelled.  -Patient states she does not desire to have female providers, but okay with students.  -Informed that provider will make notes accordingly.  -Discussed increasing procardia  to 90mg  BID from current 60mg  as bp elevated. Dr. Vallarie Gauze agrees.  -Patient agreeable.  -Continue other mgmt as ordered.  Judy Heath Maveryck Bahri,MSN, CNM 06/24/2023, 11:28 PM
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
## 2023-06-24 NOTE — Anesthesia Preprocedure Evaluation (Addendum)
 Anesthesia Evaluation  Patient identified by MRN, date of birth, ID band Patient awake    Reviewed: Allergy & Precautions, Patient's Chart, lab work & pertinent test results, reviewed documented beta blocker date and time   Airway Mallampati: III  TM Distance: >3 FB     Dental no notable dental hx. (+) Dental Advisory Given   Pulmonary asthma , Current Smoker and Patient abstained from smoking.   breath sounds clear to auscultation + decreased breath sounds      Cardiovascular hypertension, Pt. on medications and Pt. on home beta blockers Normal cardiovascular exam Rhythm:Regular Rate:Normal     Neuro/Psych  PSYCHIATRIC DISORDERS Anxiety Depression    negative neurological ROS     GI/Hepatic Neg liver ROS,GERD  ,,  Endo/Other    Class 4 obesity  Renal/GU negative Renal ROS  negative genitourinary   Musculoskeletal negative musculoskeletal ROS (+)    Abdominal  (+) + obese  Peds  Hematology  (+) Blood dyscrasia, anemia   Anesthesia Other Findings   Reproductive/Obstetrics (+) Pregnancy Hx/o Previous C/Section- placental abruption Hx/o Late latent syphilis 2016 treated                              Anesthesia Physical Anesthesia Plan  ASA: 3  Anesthesia Plan: Epidural   Post-op Pain Management: Minimal or no pain anticipated   Induction: Intravenous  PONV Risk Score and Plan: Treatment may vary due to age or medical condition  Airway Management Planned: Natural Airway  Additional Equipment: None and Fetal Monitoring  Intra-op Plan:   Post-operative Plan:   Informed Consent: I have reviewed the patients History and Physical, chart, labs and discussed the procedure including the risks, benefits and alternatives for the proposed anesthesia with the patient or authorized representative who has indicated his/her understanding and acceptance.       Plan Discussed with:  Anesthesiologist  Anesthesia Plan Comments:        Anesthesia Quick Evaluation

## 2023-06-25 MED ORDER — LACTATED RINGERS IV SOLN: 500.0000 mL | INTRAVENOUS | Status: AC | PRN

## 2023-06-25 MED ORDER — CALCIUM CARBONATE ANTACID 500 MG PO CHEW
400.0000 mg | CHEWABLE_TABLET | Freq: Once | ORAL | Status: AC
  Administered 2023-06-25: 400 mg via ORAL
  Filled 2023-06-25: qty 2

## 2023-06-25 MED ORDER — LACTATED RINGERS IV SOLN: INTRAVENOUS | Status: AC

## 2023-06-25 MED ORDER — LACTATED RINGERS IV SOLN: 500.0000 mL | Freq: Once | INTRAVENOUS | Status: DC

## 2023-06-25 NOTE — Progress Notes (Signed)
 Patient ID: Judy Wood, female   DOB: [birth year], 28 y.o.   MRN: [id number]  Subjective: -Patient resting in bed.  Reports continued comfort with epidural.   Objective: BP 134/84   Pulse 75   Temp (!) 97.5 F (36.4 C) (Oral)   Resp 16   Ht 5\' 4"  (1.626 m)   Wt (!) 136.3 kg   LMP  (LMP Unknown)   SpO2 99%   BMI 51.56 kg/m  No intake/output data recorded. No intake/output data recorded.  Fetal Monitoring: FHT: 130 bpm, Mod Var, -Decels, +Accels UC: Q2-71min    Vaginal Exam: SVE:   Dilation: 3 Effacement (%): 60 Station: -3 Exam by:: J.Jamye Balicki, CNM Membranes:AROM of small amt clear fluid Internal Monitors: None  Augmentation/Induction: Pitocin :7mUn/min Cytotec : None  Assessment:  IUP at 37.2 weeks Cat I FT  Amniotomy  Plan: -Discussed AROM r/b including increased risk of infection, cord prolapse, fetal intolerance, and decreased labor time. No questions or concerns and patient desires to proceed with AROM. -Continue to titrate pitocin .  -Continue other mgmt as ordered   Charlee Conine, CNM Advanced Practice Provider, [facility] for [facility] 06/25/2023, 5:45 AM

## 2023-06-25 NOTE — Progress Notes (Signed)
 Patient ID: Judy Wood, female   DOB: 11-Mar-1995, 28 y.o.   MRN: [id number]  Still comfortable; Pit running >24hours with IUPC in x 4h showing MVUs adequate  BPs 139/83, 134/61 FHR 130s, + 10x10 accels, avg LTV Ctx q 2 mins with Pit at 33mu/min Cx unchanged  IUP@37 .2wks IOL process cHTN TOLAC  -Discussion re Pit saturation and offer to stop Pit x 4h and then restart; she is open to this; will give Tums -Plan for restart @ [place]  Jolayne Natter [facility]  06/25/2023 8:05 PM

## 2023-06-25 NOTE — Progress Notes (Signed)
 Patient ID: Judy Wood, female   DOB: 1995/04/19, 28 y.o.   MRN: [id number]  In to introduce myself to pt; she is comfortable w her epidural  BPs 137/88, 119/85 FHR 125-130s, +accels, no decels Ctx q 2-3 mins w Pit @ 89mu/min Cx deferred (was 3/80/ballot @ 0514 at time of rupture)  IUP@37 .2wks IOL process cHTN TOLAC  -Continue home BP meds (Lab 400 tid & Proc XL 90mg  bid) -Plan to check cx in next couple of hours or sooner w symptoms -Anticipate VBAC  Jolayne Natter [facility] 06/25/2023

## 2023-06-25 NOTE — Progress Notes (Signed)
 Patient ID: Judy Wood, female   DOB: 1996/01/12, 28 y.o.   MRN: [id number]  Comfortable w epidural, although her back has been hurting  BPs 166/99, 145/85, 159/86 FHR 130s, +accels, no decels Ctx q 2-3 mins with Pit @ 28mu/min Cx unchanged (3+/60/vtx -3); IUPC inserted without difficulty  IUP@37 .2wks IOL process cHTN TOLAC  -Eval for adequate MVUs and titrate Pit accordingly -Position changed to sitting up to try to alleviate back discomfort -Hopeful for VBAC   Jolayne Natter [facility] 06/25/2023 3:48 PM

## 2023-06-25 NOTE — Progress Notes (Signed)
 Labor Progress Note Judy Wood is a 28 y.o. G3P2002 at [redacted]w[redacted]d presented for IOL due to cHTN  S: Resting, no concerns at this time.  O:  BP 117/69   Pulse 87   Temp 98.1 F (36.7 C) (Oral)   Resp 16   Ht 5\' 4"  (1.626 m)   Wt (!) 136.3 kg   LMP  (LMP Unknown)   SpO2 99%   BMI 51.56 kg/m  EFM: 130/mod/+a/-d  CVE: Dilation: 3.5 Effacement (%): 60 Cervical Position: Posterior Station: Ballotable Presentation: Vertex Exam by:: Juliaette Ober CNM   A&P: 28 y.o. [id number] [redacted]w[redacted]d here for IOL due to cHTN #Labor: Progressing well. S/p AROM. Pitocin  break until midnight. Has received Tums.  #Pain: Epidural #FWB: Cat I #GBS negative  #cHTN: BP have been anywhere from normotensive to moderate range this evening, had one severe range BP at 1531, back to moderate range on recheck. Had required multiple doses of Labetalol  yesterday, none today. Will cont to monitor BP closely. No si/sx PEC.   #Hx placental abruption: Closely monitoring BP and treating as indicated  #TOLAC: C/S for failed induction with first pregnancy, had successful VBAC subsequently  anticipate VBAC  #Late latent syphilis: s/p tx late 2024, admit titers pending  #BMI 51  Melanie Spires, MD 10:28 PM

## 2023-06-26 ENCOUNTER — Encounter (HOSPITAL_COMMUNITY)
Admission: RE | Disposition: A | Discharge status: Home / Self Care | Payer: Self-pay | Source: Home / Self Care | Attending: Family Medicine

## 2023-06-26 ENCOUNTER — Other Ambulatory Visit (HOSPITAL_COMMUNITY): Payer: Self-pay

## 2023-06-26 ENCOUNTER — Encounter: Payer: Self-pay | Admitting: *Deleted

## 2023-06-26 ENCOUNTER — Other Ambulatory Visit: Payer: Self-pay | Admitting: Obstetrics and Gynecology

## 2023-06-26 ENCOUNTER — Other Ambulatory Visit: Payer: Self-pay

## 2023-06-26 ENCOUNTER — Encounter (HOSPITAL_COMMUNITY): Payer: Self-pay | Admitting: Obstetrics & Gynecology

## 2023-06-26 DIAGNOSIS — O34211 Maternal care for low transverse scar from previous cesarean delivery: Secondary | ICD-10-CM

## 2023-06-26 DIAGNOSIS — O411230 Chorioamnionitis, third trimester, not applicable or unspecified: Secondary | ICD-10-CM

## 2023-06-26 DIAGNOSIS — O1002 Pre-existing essential hypertension complicating childbirth: Secondary | ICD-10-CM | POA: Diagnosis not present

## 2023-06-26 DIAGNOSIS — O99214 Obesity complicating childbirth: Secondary | ICD-10-CM | POA: Diagnosis not present

## 2023-06-26 DIAGNOSIS — Z3A37 37 weeks gestation of pregnancy: Secondary | ICD-10-CM

## 2023-06-26 DIAGNOSIS — O99344 Other mental disorders complicating childbirth: Secondary | ICD-10-CM | POA: Diagnosis not present

## 2023-06-26 LAB — CBC
HCT: 32.7 % — ABNORMAL LOW (ref 36.0–46.0)
Hemoglobin: 11.1 g/dL — ABNORMAL LOW (ref 12.0–15.0)
MCH: 30.9 pg (ref 26.0–34.0)
MCHC: 33.9 g/dL (ref 30.0–36.0)
MCV: 91.1 fL (ref 80.0–100.0)
Platelets: 304 10*3/uL (ref 150–400)
RBC: 3.59 MIL/uL — ABNORMAL LOW (ref 3.87–5.11)
RDW: 12.5 % (ref 11.5–15.5)
WBC: 17.9 10*3/uL — ABNORMAL HIGH (ref 4.0–10.5)
nRBC: 0 % (ref 0.0–0.2)

## 2023-06-26 LAB — RPR
RPR Ser Ql: REACTIVE — AB
RPR Ser Ql: REACTIVE — AB
RPR Titer: UNDETERMINED
RPR Titer: 1:8 {titer}

## 2023-06-26 SURGERY — Surgical Case
Anesthesia: Epidural

## 2023-06-26 MED ORDER — CEFAZOLIN SODIUM-DEXTROSE 3-4 GM/150ML-% IV SOLN
INTRAVENOUS | Status: AC
  Filled 2023-06-26: qty 150

## 2023-06-26 MED ORDER — OXYCODONE HCL 5 MG PO TABS
5.0000 mg | ORAL_TABLET | ORAL | Status: DC | PRN
Start: 2023-06-26 — End: 2023-06-29
  Administered 2023-06-27 – 2023-06-28 (×3): 5 mg via ORAL
  Filled 2023-06-26: qty 2
  Filled 2023-06-26 (×2): qty 1

## 2023-06-26 MED ORDER — MORPHINE SULFATE (PF) 0.5 MG/ML IJ SOLN
INTRAMUSCULAR | Status: DC | PRN
  Administered 2023-06-26: 150 ug via INTRATHECAL

## 2023-06-26 MED ORDER — PHENYLEPHRINE 80 MCG/ML (10ML) SYRINGE FOR IV PUSH (FOR BLOOD PRESSURE SUPPORT)
PREFILLED_SYRINGE | INTRAVENOUS | Status: AC
  Filled 2023-06-26: qty 10

## 2023-06-26 MED ORDER — TRANEXAMIC ACID-NACL 1000-0.7 MG/100ML-% IV SOLN
INTRAVENOUS | Status: DC | PRN
  Administered 2023-06-26: 1000 mg via INTRAVENOUS

## 2023-06-26 MED ORDER — OXYTOCIN-SODIUM CHLORIDE 30-0.9 UT/500ML-% IV SOLN
INTRAVENOUS | Status: AC
  Filled 2023-06-26: qty 500

## 2023-06-26 MED ORDER — PRENATAL MULTIVITAMIN CH
1.0000 | ORAL_TABLET | Freq: Every day | ORAL | Status: DC
  Administered 2023-06-27 – 2023-06-29 (×3): 1 via ORAL
  Filled 2023-06-26 (×3): qty 1

## 2023-06-26 MED ORDER — LIDOCAINE-EPINEPHRINE (PF) 2 %-1:200000 IJ SOLN
INTRAMUSCULAR | Status: DC | PRN
Start: 2023-06-26 — End: 2023-06-26
  Administered 2023-06-26: 5 mL via EPIDURAL

## 2023-06-26 MED ORDER — PHENYLEPHRINE 80 MCG/ML (10ML) SYRINGE FOR IV PUSH (FOR BLOOD PRESSURE SUPPORT)
PREFILLED_SYRINGE | INTRAVENOUS | Status: DC | PRN
  Administered 2023-06-26: 80 ug via INTRAVENOUS

## 2023-06-26 MED ORDER — HYDROXYZINE HCL 25 MG PO TABS
50.0000 mg | ORAL_TABLET | Freq: Three times a day (TID) | ORAL | Status: DC | PRN
  Administered 2023-06-26: 50 mg via ORAL
  Filled 2023-06-26: qty 1

## 2023-06-26 MED ORDER — SIMETHICONE 80 MG PO CHEW: 80.0000 mg | CHEWABLE_TABLET | ORAL | Status: DC | PRN

## 2023-06-26 MED ORDER — BUPIVACAINE IN DEXTROSE 0.75-8.25 % IT SOLN
INTRATHECAL | Status: DC | PRN
  Administered 2023-06-26: 1.6 mL via INTRATHECAL

## 2023-06-26 MED ORDER — WITCH HAZEL-GLYCERIN EX PADS: 1.0000 | MEDICATED_PAD | CUTANEOUS | Status: DC | PRN

## 2023-06-26 MED ORDER — DIPHENHYDRAMINE HCL 25 MG PO CAPS: 25.0000 mg | ORAL_CAPSULE | Freq: Four times a day (QID) | ORAL | Status: DC | PRN

## 2023-06-26 MED ORDER — KETOROLAC TROMETHAMINE 30 MG/ML IJ SOLN
30.0000 mg | Freq: Four times a day (QID) | INTRAMUSCULAR | Status: AC
  Administered 2023-06-26 – 2023-06-27 (×4): 30 mg via INTRAVENOUS
  Filled 2023-06-26 (×4): qty 1

## 2023-06-26 MED ORDER — MAGNESIUM HYDROXIDE 400 MG/5ML PO SUSP: 30.0000 mL | ORAL | Status: DC | PRN

## 2023-06-26 MED ORDER — FUROSEMIDE 20 MG PO TABS
20.0000 mg | ORAL_TABLET | Freq: Every day | ORAL | Status: DC
  Administered 2023-06-26 – 2023-06-29 (×4): 20 mg via ORAL
  Filled 2023-06-26 (×4): qty 1

## 2023-06-26 MED ORDER — ZOLPIDEM TARTRATE 5 MG PO TABS: 5.0000 mg | ORAL_TABLET | Freq: Every evening | ORAL | Status: DC | PRN

## 2023-06-26 MED ORDER — COCONUT OIL OIL: 1.0000 | TOPICAL_OIL | Status: DC | PRN

## 2023-06-26 MED ORDER — MORPHINE SULFATE (PF) 0.5 MG/ML IJ SOLN
INTRAMUSCULAR | Status: AC
  Filled 2023-06-26: qty 10

## 2023-06-26 MED ORDER — MENTHOL 3 MG MT LOZG: 1.0000 | LOZENGE | OROMUCOSAL | Status: DC | PRN

## 2023-06-26 MED ORDER — GENTAMICIN SULFATE 40 MG/ML IJ SOLN
5.0000 mg/kg | Freq: Once | INTRAVENOUS | Status: AC
  Administered 2023-06-26: 440 mg via INTRAVENOUS
  Filled 2023-06-26: qty 11

## 2023-06-26 MED ORDER — ONDANSETRON HCL 4 MG/2ML IJ SOLN
INTRAMUSCULAR | Status: DC | PRN
  Administered 2023-06-26: 4 mg via INTRAVENOUS

## 2023-06-26 MED ORDER — MISOPROSTOL 200 MCG PO TABS
ORAL_TABLET | ORAL | Status: AC
  Filled 2023-06-26: qty 1

## 2023-06-26 MED ORDER — DROPERIDOL 2.5 MG/ML IJ SOLN: 0.6250 mg | Freq: Once | INTRAMUSCULAR | Status: DC | PRN

## 2023-06-26 MED ORDER — FENTANYL CITRATE (PF) 100 MCG/2ML IJ SOLN: 25.0000 ug | INTRAMUSCULAR | Status: DC | PRN

## 2023-06-26 MED ORDER — GABAPENTIN 100 MG PO CAPS
200.0000 mg | ORAL_CAPSULE | Freq: Every day | ORAL | Status: DC
  Administered 2023-06-26 – 2023-06-28 (×3): 200 mg via ORAL
  Filled 2023-06-26 (×3): qty 2

## 2023-06-26 MED ORDER — METOCLOPRAMIDE HCL 5 MG/ML IJ SOLN
10.0000 mg | Freq: Once | INTRAMUSCULAR | Status: AC
  Administered 2023-06-26: 10 mg via INTRAVENOUS
  Filled 2023-06-26: qty 2

## 2023-06-26 MED ORDER — NALOXONE HCL 4 MG/10ML IJ SOLN: 1.0000 ug/kg/h | INTRAVENOUS | Status: DC | PRN

## 2023-06-26 MED ORDER — OXYTOCIN-SODIUM CHLORIDE 30-0.9 UT/500ML-% IV SOLN
2.5000 [IU]/h | INTRAVENOUS | Status: AC
  Administered 2023-06-26: 2.5 [IU]/h via INTRAVENOUS
  Filled 2023-06-26: qty 500

## 2023-06-26 MED ORDER — PHENYLEPHRINE HCL-NACL 20-0.9 MG/250ML-% IV SOLN
INTRAVENOUS | Status: DC | PRN
  Administered 2023-06-26: 60 ug/min via INTRAVENOUS

## 2023-06-26 MED ORDER — MEPERIDINE HCL 25 MG/ML IJ SOLN: 6.2500 mg | INTRAMUSCULAR | Status: DC | PRN

## 2023-06-26 MED ORDER — ACETAMINOPHEN 500 MG PO TABS: 1000.0000 mg | ORAL_TABLET | Freq: Four times a day (QID) | ORAL | Status: DC

## 2023-06-26 MED ORDER — STERILE WATER FOR IRRIGATION IR SOLN
Status: DC | PRN
  Administered 2023-06-26: 1000 mL

## 2023-06-26 MED ORDER — SCOPOLAMINE 1 MG/3DAYS TD PT72: 1.0000 | MEDICATED_PATCH | Freq: Once | TRANSDERMAL | Status: DC

## 2023-06-26 MED ORDER — SODIUM CHLORIDE 0.9 % IV SOLN
2.0000 g | Freq: Four times a day (QID) | INTRAVENOUS | Status: AC
  Administered 2023-06-26 – 2023-06-27 (×4): 2 g via INTRAVENOUS
  Filled 2023-06-26 (×4): qty 2000

## 2023-06-26 MED ORDER — OXYCODONE HCL 5 MG/5ML PO SOLN: 5.0000 mg | Freq: Once | ORAL | Status: DC | PRN

## 2023-06-26 MED ORDER — SODIUM CHLORIDE 0.9 % IR SOLN
Status: DC | PRN
  Administered 2023-06-26: 1

## 2023-06-26 MED ORDER — POTASSIUM CHLORIDE CRYS ER 20 MEQ PO TBCR
20.0000 meq | EXTENDED_RELEASE_TABLET | Freq: Every day | ORAL | Status: DC
  Administered 2023-06-26 – 2023-06-29 (×4): 20 meq via ORAL
  Filled 2023-06-26 (×4): qty 1

## 2023-06-26 MED ORDER — SENNOSIDES-DOCUSATE SODIUM 8.6-50 MG PO TABS
2.0000 | ORAL_TABLET | Freq: Every day | ORAL | Status: DC
  Administered 2023-06-27 – 2023-06-29 (×3): 2 via ORAL
  Filled 2023-06-26 (×3): qty 2

## 2023-06-26 MED ORDER — SODIUM CHLORIDE 0.9% FLUSH: 3.0000 mL | INTRAVENOUS | Status: DC | PRN

## 2023-06-26 MED ORDER — FENTANYL CITRATE (PF) 100 MCG/2ML IJ SOLN
INTRAMUSCULAR | Status: AC
  Filled 2023-06-26: qty 2

## 2023-06-26 MED ORDER — SODIUM CHLORIDE 0.9 % IV SOLN
INTRAVENOUS | Status: DC | PRN
  Administered 2023-06-26: 500 mg via INTRAVENOUS

## 2023-06-26 MED ORDER — ONDANSETRON HCL 4 MG/2ML IJ SOLN
INTRAMUSCULAR | Status: AC
  Filled 2023-06-26: qty 2

## 2023-06-26 MED ORDER — OXYCODONE HCL 5 MG PO TABS: 5.0000 mg | ORAL_TABLET | Freq: Once | ORAL | Status: DC | PRN

## 2023-06-26 MED ORDER — ENOXAPARIN SODIUM 40 MG/0.4ML IJ SOSY
40.0000 mg | PREFILLED_SYRINGE | INTRAMUSCULAR | Status: DC
  Administered 2023-06-27 – 2023-06-29 (×3): 40 mg via SUBCUTANEOUS
  Filled 2023-06-26 (×3): qty 0.4

## 2023-06-26 MED ORDER — PHENYLEPHRINE HCL-NACL 20-0.9 MG/250ML-% IV SOLN
INTRAVENOUS | Status: AC
  Filled 2023-06-26: qty 250

## 2023-06-26 MED ORDER — SIMETHICONE 80 MG PO CHEW
80.0000 mg | CHEWABLE_TABLET | Freq: Three times a day (TID) | ORAL | Status: DC
  Administered 2023-06-26 – 2023-06-29 (×7): 80 mg via ORAL
  Filled 2023-06-26 (×9): qty 1

## 2023-06-26 MED ORDER — ACETAMINOPHEN 10 MG/ML IV SOLN: 1000.0000 mg | Freq: Once | INTRAVENOUS | Status: DC | PRN

## 2023-06-26 MED ORDER — NALOXONE HCL 0.4 MG/ML IJ SOLN: 0.4000 mg | INTRAMUSCULAR | Status: DC | PRN

## 2023-06-26 MED ORDER — SODIUM CHLORIDE 0.9 % IV SOLN
INTRAVENOUS | Status: AC
  Filled 2023-06-26: qty 5

## 2023-06-26 MED ORDER — DEXTROSE 5 % IV SOLN
INTRAVENOUS | Status: DC | PRN
  Administered 2023-06-26: 3 g via INTRAVENOUS

## 2023-06-26 MED ORDER — ACETAMINOPHEN 500 MG PO TABS
1000.0000 mg | ORAL_TABLET | Freq: Four times a day (QID) | ORAL | Status: DC
  Administered 2023-06-26 – 2023-06-29 (×12): 1000 mg via ORAL
  Filled 2023-06-26 (×12): qty 2

## 2023-06-26 MED ORDER — FENTANYL CITRATE (PF) 100 MCG/2ML IJ SOLN
INTRAMUSCULAR | Status: DC | PRN
  Administered 2023-06-26: 15 ug via INTRATHECAL

## 2023-06-26 MED ORDER — LACTATED RINGERS IV SOLN: INTRAVENOUS | Status: DC

## 2023-06-26 MED ORDER — IBUPROFEN 600 MG PO TABS
600.0000 mg | ORAL_TABLET | Freq: Four times a day (QID) | ORAL | Status: DC
  Administered 2023-06-27 – 2023-06-29 (×8): 600 mg via ORAL
  Filled 2023-06-26 (×8): qty 1

## 2023-06-26 MED ORDER — OXYTOCIN-SODIUM CHLORIDE 30-0.9 UT/500ML-% IV SOLN
INTRAVENOUS | Status: DC | PRN
  Administered 2023-06-26: 30 [IU] via INTRAVENOUS

## 2023-06-26 MED ORDER — DIBUCAINE (PERIANAL) 1 % EX OINT: 1.0000 | TOPICAL_OINTMENT | CUTANEOUS | Status: DC | PRN

## 2023-06-26 SURGICAL SUPPLY — 34 items
BENZOIN TINCTURE PRP APPL 2/3 (GAUZE/BANDAGES/DRESSINGS) IMPLANT
CANISTER WOUND CARE 500ML ATS (WOUND CARE) IMPLANT
CHLORAPREP W/TINT 26 (MISCELLANEOUS) ×2 IMPLANT
CLAMP UMBILICAL CORD (MISCELLANEOUS) ×1 IMPLANT
CLOTH BEACON ORANGE TIMEOUT ST (SAFETY) ×1 IMPLANT
DERMABOND ADVANCED .7 DNX12 (GAUZE/BANDAGES/DRESSINGS) ×2 IMPLANT
DRESSING PREVENA PLUS CUSTOM (GAUZE/BANDAGES/DRESSINGS) IMPLANT
DRSG OPSITE POSTOP 4X10 (GAUZE/BANDAGES/DRESSINGS) ×1 IMPLANT
ELECTRODE REM PT RTRN 9FT ADLT (ELECTROSURGICAL) ×1 IMPLANT
EXTRACTOR VACUUM M CUP 4 TUBE (SUCTIONS) IMPLANT
GLOVE BIOGEL PI IND STRL 7.0 (GLOVE) ×2 IMPLANT
GLOVE BIOGEL PI IND STRL 7.5 (GLOVE) ×2 IMPLANT
GLOVE ECLIPSE 7.5 STRL STRAW (GLOVE) ×1 IMPLANT
GOWN STRL REUS W/TWL LRG LVL3 (GOWN DISPOSABLE) ×3 IMPLANT
KIT ABG SYR 3ML LUER SLIP (SYRINGE) IMPLANT
MAT PREVALON FULL STRYKER (MISCELLANEOUS) IMPLANT
NDL HYPO 25X5/8 SAFETYGLIDE (NEEDLE) IMPLANT
NEEDLE HYPO 25X5/8 SAFETYGLIDE (NEEDLE) IMPLANT
NS IRRIG 1000ML POUR BTL (IV SOLUTION) ×1 IMPLANT
PACK C SECTION WH (CUSTOM PROCEDURE TRAY) ×1 IMPLANT
PAD OB MATERNITY 4.3X12.25 (PERSONAL CARE ITEMS) ×1 IMPLANT
PENCIL SMOKE EVACUATOR (MISCELLANEOUS) IMPLANT
RETAINER VISCERAL (MISCELLANEOUS) IMPLANT
RETRACTOR TRAXI PANNICULUS (MISCELLANEOUS) IMPLANT
RTRCTR C-SECT PINK 25CM LRG (MISCELLANEOUS) ×1 IMPLANT
STRIP CLOSURE SKIN 1/2X4 (GAUZE/BANDAGES/DRESSINGS) IMPLANT
SUT MNCRL 0 VIOLET CTX 36 (SUTURE) ×2 IMPLANT
SUT VIC AB 0 CTX36XBRD ANBCTRL (SUTURE) ×1 IMPLANT
SUT VIC AB 2-0 CT1 TAPERPNT 27 (SUTURE) ×1 IMPLANT
SUT VIC AB 4-0 KS 27 (SUTURE) ×1 IMPLANT
SUTURE PLAIN GUT 2.0 ETHICON (SUTURE) IMPLANT
TOWEL OR 17X24 6PK STRL BLUE (TOWEL DISPOSABLE) ×1 IMPLANT
TRAY FOLEY W/BAG SLVR 14FR LF (SET/KITS/TRAYS/PACK) ×1 IMPLANT
WATER STERILE IRR 1000ML POUR (IV SOLUTION) ×1 IMPLANT

## 2023-06-26 NOTE — Transfer of Care (Signed)
 Immediate Anesthesia Transfer of Care Note  Patient: Judy Wood  Procedure(s) Performed: CESAREAN DELIVERY  Patient Location: PACU  Anesthesia Type:Spinal  Level of Consciousness: awake, alert , and oriented  Airway & Oxygen Therapy: Patient Spontanous Breathing  Post-op Assessment: Report given to RN and Post -op Vital signs reviewed and stable  Post vital signs: Reviewed and stable  Last Vitals:  Vitals Value Taken Time  BP 137/82 06/26/23 1520  Temp 36.8 C 06/26/23 1520  Pulse 86 06/26/23 1520  Resp 20 06/26/23 1520  SpO2 99 % 06/26/23 1520    Last Pain:  Vitals:   06/26/23 1520  TempSrc: Oral  PainSc: 2          Complications: No notable events documented.

## 2023-06-26 NOTE — Anesthesia Postprocedure Evaluation (Signed)
 Anesthesia Post Note  Patient: Judy Wood  Procedure(s) Performed: CESAREAN DELIVERY     Patient location during evaluation: PACU Anesthesia Type: Spinal Level of consciousness: oriented and awake and alert Pain management: pain level controlled Vital Signs Assessment: post-procedure vital signs reviewed and stable Respiratory status: spontaneous breathing, respiratory function stable and patient connected to nasal cannula oxygen Cardiovascular status: blood pressure returned to baseline and stable Postop Assessment: no headache, no backache and no apparent nausea or vomiting Anesthetic complications: no   No notable events documented.  Last Vitals:  Vitals:   06/26/23 1330 06/26/23 1347  BP: 126/80 (!) 113/93  Pulse: 77 78  Resp: 15 17  Temp:    SpO2: 96% 94%    Last Pain:  Vitals:   06/26/23 1345  TempSrc:   PainSc: 0-No pain    LLE Motor Response: Non-purposeful movement (06/26/23 1345) LLE Sensation: Tingling (06/26/23 1345) RLE Motor Response: Non-purposeful movement (06/26/23 1345) RLE Sensation: Tingling (06/26/23 1345)      Lethaniel Rave

## 2023-06-26 NOTE — Discharge Summary (Signed)
 Postpartum Discharge Summary     Patient Name: Judy Wood DOB: 15-Jul-1995 MRN: [id number]  Date of admission: 06/24/2023 Delivery date:06/26/2023 Delivering provider: Malka Sea Date of discharge: 06/29/2023  Admitting diagnosis: Gestational hypertension [O13.9] Intrauterine pregnancy: [redacted]w[redacted]d     Secondary diagnosis:  Active Problems:   Preexisting hypertension complicating pregnancy, antepartum   Late latent syphilis   Maternal morbid obesity, antepartum (HCC)   History of placenta abruption at 37 weeks   Depression   PTSD (post-traumatic stress disorder)   History of cesarean section followed by vaginal birth (VBAC)   COVID-19 affecting pregnancy in second trimester   BMI 50.0-59.9, adult (HCC)   Anemia in pregnancy, third trimester  Additional problems: cHTN with severe range BP -- discussed concern for severe range BP with patient in detail and possibility of pre-e w SF and si/sx, pt desired discharge and amenable to close f/up   Discharge diagnosis: Term Pregnancy Delivered, CHTN, and Chorioamnionitis                                               Post partum procedures: None Augmentation: AROM and Pitocin  Complications: Chorioamnionitis   Hospital course: Induction of Labor With Cesarean Section   28 y.o. yo [id number] at [redacted]w[redacted]d was admitted to the hospital 06/24/2023 for induction of labor. Patient had a labor course significant for two failed epidurals and thusly desired elective repeat csection. The patient went for cesarean section due to Elective Repeat. Upon hysterotomy it was apparent by the malodorous amniotic fluid that there was concern for chorioamnionitis. Received 24 hr post csection abx. Delivery details are as follows: Membrane Rupture Time/Date: 5:20 AM,06/25/2023  Delivery Method:C-Section, Low Transverse Operative Delivery:N/A Details of operation can be found in separate operative Note.  Patient had a postpartum course complicated by severe range BP on date  of discharge -- pt asymptomatic, desired discharge, despite recommendations to stay for close BP monitoring, discussed si/sx severe pre-e, BP monitoring, and return precautions in detail. She is ambulating, tolerating a regular diet, passing flatus, and urinating well.  Patient is discharged home in stable condition on 06/29/23.      Newborn Data: Birth date:06/26/2023 Birth time:12:09 PM Gender:Female Living status:Living Apgars:9 ,9  Weight:2760 g                               Magnesium  Sulfate received: No BMZ received: No Rhophylac:N/A MMR:N/A T-DaP:Given prenatally Flu: No RSV Vaccine received: No Transfusion:No  Immunizations received: Immunization History  Administered Date(s) Administered   Influenza Split 11/24/2011   Influenza, Seasonal, Injecte, Preservative Fre 01/17/2023   Influenza,inj,Quad PF,6+ Mos 04/22/2014   Pneumococcal Polysaccharide-23 10/15/2014   Tdap 03/01/2012, 09/15/2014, 09/28/2022    Physical exam  Vitals:   06/29/23 1004 06/29/23 1154 06/29/23 1426 06/29/23 1442  BP: (!) 155/91 (!) 145/98 (!) 161/86 (!) (P) 153/88  Pulse: 89     Resp:      Temp:      TempSrc:      SpO2:      Weight:      Height:       General: alert, cooperative, and no distress Lochia: appropriate Uterine Fundus: firm Incision: Prevena dressing in place DVT Evaluation: No cords or calf tenderness. No significant calf/ankle edema. Labs: Lab Results  Component Value Date  WBC 14.2 (H) 06/27/2023   HGB 8.1 (L) 06/27/2023   HCT 23.7 (L) 06/27/2023   MCV 93.3 06/27/2023   PLT 256 06/27/2023      Latest Ref Rng & Units 06/27/2023    6:19 AM  CMP  Glucose 70 - 99 mg/dL 95   BUN 6 - 20 mg/dL 9   Creatinine 1.61 - 0.96 mg/dL 0.45   Sodium 409 - 811 mmol/L 135   Potassium 3.5 - 5.1 mmol/L 3.9   Chloride 98 - 111 mmol/L 104   CO2 22 - 32 mmol/L 23   Calcium  8.9 - 10.3 mg/dL 8.4   Total Protein 6.5 - 8.1 g/dL 5.6   Total Bilirubin 0.0 - 1.2 mg/dL 0.4   Alkaline  Phos 38 - 126 U/L 62   AST 15 - 41 U/L 14   ALT 0 - 44 U/L 9    Edinburgh Score:    06/26/2023    2:18 PM  Edinburgh Postnatal Depression Scale Screening Tool  I have been able to laugh and see the funny side of things. 0  I have looked forward with enjoyment to things. 0  I have blamed myself unnecessarily when things went wrong. 0  I have been anxious or worried for no good reason. 0  I have felt scared or panicky for no good reason. 0  Things have been getting on top of me. 0  I have been so unhappy that I have had difficulty sleeping. 0  I have felt sad or miserable. 0  I have been so unhappy that I have been crying. 0  The thought of harming myself has occurred to me. 0  Edinburgh Postnatal Depression Scale Total 0   No data recorded  After visit meds:  Allergies as of 06/29/2023       Reactions   Banana Anaphylaxis   Lactose Intolerance (gi) Diarrhea, Nausea Only   Sulfa Antibiotics Other (See Comments)   Childhood reaction        Medication List     STOP taking these medications    aspirin  EC 81 MG tablet       TAKE these medications    acetaminophen  500 MG tablet Commonly known as: TYLENOL  Take 1,000 mg by mouth every 8 (eight) hours as needed for headache.   albuterol  108 (90 Base) MCG/ACT inhaler Commonly known as: VENTOLIN  HFA Inhale 1-2 puffs into the lungs every 6 (six) hours as needed for wheezing or shortness of breath.   Blood Pressure Kit Devi 1 Device by Does not apply route daily.   cyclobenzaprine  10 MG tablet Commonly known as: FLEXERIL  Take 1 tablet (10 mg total) by mouth every 8 (eight) hours as needed for muscle spasms.   emtricitabine -tenofovir  200-300 MG tablet Commonly known as: Truvada Take 1 tablet by mouth daily.   furosemide  40 MG tablet Commonly known as: LASIX  Take 1 tablet (40 mg total) by mouth daily. Start taking on: Jun 30, 2023   hydrOXYzine  25 MG tablet Commonly known as: ATARAX  Take 1 tablet (25 mg total) by  mouth every 6 (six) hours as needed for anxiety.   ibuprofen  600 MG tablet Commonly known as: ADVIL  Take 1 tablet (600 mg total) by mouth every 6 (six) hours.   labetalol  300 MG tablet Commonly known as: NORMODYNE  Take 2 tablets (600 mg total) by mouth 3 (three) times daily. What changed:  medication strength how much to take when to take this   NIFEdipine  90 MG 24 hr tablet  Commonly known as: PROCARDIA  XL/NIFEDICAL-XL Take 1 tablet (90 mg total) by mouth 2 (two) times daily. What changed:  medication strength how much to take when to take this   oxyCODONE  5 MG immediate release tablet Commonly known as: Oxy IR/ROXICODONE  Take 1 tablet (5 mg total) by mouth every 6 (six) hours as needed for severe pain (pain score 7-10) or breakthrough pain.   potassium chloride  SA 20 MEQ tablet Commonly known as: KLOR-CON  M Take 2 tablets (40 mEq total) by mouth daily. Start taking on: Jun 30, 2023   Prenatal 28-0.8 MG Tabs Take 1 tablet by mouth daily.   senna-docusate 8.6-50 MG tablet Commonly known as: Senokot-S Take 2 tablets by mouth daily. Start taking on: Jun 30, 2023   sertraline  50 MG tablet Commonly known as: ZOLOFT  Starting 05/14/23, Take 1 tablet (50 mg total) by mouth daily.         Discharge home in stable condition Infant Feeding: Breast Infant Disposition:home with mother Discharge instruction: per After Visit Summary and Postpartum booklet. Activity: Advance as tolerated. Pelvic rest for 6 weeks.  Diet: routine diet Future Appointments: Future Appointments  Date Time Provider Department Center  07/05/2023  3:00 PM [facility] NURSE [facility]  07/20/2023  9:45 AM [facility]  07/27/2023  2:15 PM [facility]  07/31/2023  2:35 PM Ebony Goldstein, MD [facility]   Follow up Visit:  Follow-up Information     [facility] Follow up in 1 week(s).   Why: Blood pressure and incision check Contact  information: [place]                Message to [facility] 4/28  Please schedule this patient for a In person postpartum visit in 4 weeks with the following provider: Any provider. Additional Postpartum F/U:Postpartum Depression checkup, Incision check 1 week, and BP check 1 week, BP check 2-3 days  High risk pregnancy complicated by: HTN and elective repeat and maternal obesity and PTSD/MDD and Anemia  Delivery mode:  C-Section, Low Transverse Anticipated Birth Control:  Nexplanon    06/29/2023 Melanie Spires, MD

## 2023-06-26 NOTE — Progress Notes (Addendum)
 Patient ID: Judy Wood, female   DOB: 1995-09-06, 28 y.o.   MRN: [id number]  Subjective: -Strip reviewed and concern for inadequate contractions. Provider to bedside to reassess and consider change in IUPC. Patient vomiting in bed.  Reports pain in back and side is improving with bolus dosing.   Objective: BP (!) 154/96   Pulse 91   Temp 97.8 F (36.6 C) (Oral)   Resp 16   Ht 5\' 4"  (1.626 m)   Wt (!) 136.3 kg   LMP  (LMP Unknown)   SpO2 99%   BMI 51.56 kg/m  I/O last 3 completed shifts: In: 1913.6 [P.O.:880; I.V.:1033.6] Out: 3050 [Urine:3050] Total I/O In: -  Out: 3600 [Urine:3600]  Fetal Monitoring: FHT: 135 bpm, Mod Var, -Decels, +Accels UC: Qmin    Vaginal Exam: SVE:   Dilation: 3.5 Effacement (%): 60 Station: -2, -3 Exam by:: B Boyer RN Membranes:AROM x 25 hrs Internal Monitors: IUPC  Augmentation/Induction: Pitocin :58mUn/min Cytotec : None  Assessment:  IUP at 37.3 weeks Cat I FT  cHTN TOLAC  Plan: -Will give antiemetic for nausea. -Allow for improvement in pain and discomfort before recheck and potential change in IUPC.   -Dr. Vallarie Gauze updated on patient status and agreeable.   Judy Wood, CNM Advanced Practice Provider, Center for [facility] 06/26/2023, 6:14 AM  Addendum 6:52 AM -Patient called out requesting to speak with provider. Reports irritation in vaginal area with IUPC and requests removal.  -Reviewed usage and recommendation for IUPC and patient verbalizes understanding, but continues to request removal. -IUPC removed. -Patient also requests pitocin  to be turned off until pain is improved.  -Provider advises against discontinuation and recommends decreasing as this would allow for some rest between contractions.   Patient agreeable.  -Pitocin  from 14mUn/min to 8mUn/min.  -Nurse to contact anesthesia for evaluation.  -Patient offered and accepts medication for anxiety. Hydroxyzine  50mg  tablet ordered.  -Team to be updated on  patient status.   Judy Peru MSN, CNM Advanced Practice Provider, Center for [employer]

## 2023-06-26 NOTE — Anesthesia Procedure Notes (Signed)
 Epidural Patient location during procedure: OB Start time: 06/26/2023 8:10 AM End time: 06/26/2023 8:30 AM  Staffing Anesthesiologist: Lethaniel Rave, MD Performed: anesthesiologist   Preanesthetic Checklist Completed: patient identified, IV checked, risks and benefits discussed, monitors and equipment checked, pre-op evaluation and timeout performed  Epidural Patient position: sitting Prep: DuraPrep Patient monitoring: heart rate, cardiac monitor, continuous pulse ox and blood pressure Approach: midline Location: L3-L4 Injection technique: LOR air  Needle:  Needle type: Tuohy  Needle gauge: 17 G Needle length: 9 cm Needle insertion depth: 8 cm Catheter type: closed end flexible Catheter size: 19 Gauge Catheter at skin depth: 13 cm Test dose: negative  Assessment Sensory level: T8  Additional Notes Patient identified. Risks/Benefits/Options discussed with patient including but not limited to bleeding, infection, nerve damage, paralysis, failed block, incomplete pain control, headache, blood pressure changes, nausea, vomiting, reactions to medication both or allergic, itching and postpartum back pain. Confirmed with bedside nurse the patient's most recent platelet count. Confirmed with patient that they are not currently taking any anticoagulation, have any bleeding history or any family history of bleeding disorders. Patient expressed understanding and wished to proceed. All questions were answered. Sterile technique was used throughout the entire procedure. Please see nursing notes for vital signs. Test dose was given through epidural catheter and negative prior to continuing to dose epidural or start infusion. Warning signs of high block given to the patient including shortness of breath, tingling/numbness in hands, complete motor block, or any concerning symptoms with instructions to call for help. Patient was given instructions on fall risk and not to get out of bed. All questions  and concerns addressed with instructions to call with any issues or inadequate analgesia.  Reason for block:procedure for pain

## 2023-06-26 NOTE — Progress Notes (Signed)
 Patient ID: Judy Wood, female   DOB: January 22, 1996, 28 y.o.   MRN: [id number]  Patient not able to get comfortable with epidural despite replacement. Anesthesiologist removed epidural. Foley catheter also removed. Patient tired, in pain, and is adamantly requesting a cesarean delivery.   The risks of cesarean section discussed with the patient included but were not limited to: bleeding which may require transfusion or reoperation; infection which may require antibiotics; injury to bowel, bladder, ureters or other surrounding organs; injury to the fetus; need for additional procedures including hysterectomy in the event of a life-threatening hemorrhage; placental abnormalities wth subsequent pregnancies, incisional problems, thromboembolic phenomenon and other postoperative/anesthesia complications. The patient concurred with the proposed plan, giving informed written consent for the procedure. Anesthesia and OR aware.  Preoperative prophylactic Ancef  ordered on call to the OR.  To OR when ready.  Stanisha Lorenz J, DO 06/26/2023 11:01 AM

## 2023-06-26 NOTE — Lactation Note (Signed)
 This note was copied from a baby's chart. Lactation Consultation Note  Patient Name: Judy Wood [id number] Date: 06/26/2023 Age:30 hours Reason for consult: Initial assessment;Early term 37-38.6wks  P3- MOB plans to offer both breast milk and formula. MOB reports that she was able to breast feed both of her older children for 6 months each, but she did have to supplement with formula due to low supply. MOB reported that infant had just previously ate formula, so LC was unable to assist with a latch. MOB informed LC that she really wants to provide breast milk primarily with this child. LC encouraged MOB to call for feedings to have assistance and guidance so she has great latching abilities. MOB agreed. MOB denied having further questions or concerns at this time.  LC reviewed the first 24 hr birthday nap, day 2 cluster feeding, feeding infant on cue 8-12x in 24 hrs, not allowing infant to go over 3 hrs without a feeding, CDC milk storage guidelines, LC services handout and engorgement/breast care. LC encouraged MOB to call for further assistance as needed.  Maternal Data Has patient been taught Hand Expression?: No Does the patient have breastfeeding experience prior to this delivery?: Yes How long did the patient breastfeed?: 6 months of combo feeding both older children  Feeding Mother's Current Feeding Choice: Breast Milk and Formula  Lactation Tools Discussed/Used Pump Education: Milk Storage  Interventions Interventions: Breast feeding basics reviewed;Education;LC Services brochure  Discharge Discharge Education: Engorgement and breast care;Warning signs for feeding baby Pump: DEBP;Personal  Consult Status Consult Status: Follow-up Date: 06/27/23 Follow-up type: In-patient    Judy Wood BS, IBCLC 06/26/2023, 9:42 PM

## 2023-06-26 NOTE — Lactation Note (Signed)
 This note was copied from a baby's chart. Lactation Consultation Note  Patient Name: Judy Wood [id number] Date: 06/26/2023 Age:30 hours   P3- MOB was asleep when LC entered the room. LC will attempt to see MOB at another time.   [name] BS, IBCLC 06/26/2023, 5:04 PM

## 2023-06-26 NOTE — Op Note (Signed)
 Judy Wood PROCEDURE DATE: 06/26/2023  PREOPERATIVE DIAGNOSES: Intrauterine pregnancy at [redacted]w[redacted]d weeks gestation;  elective repeat  POSTOPERATIVE DIAGNOSES: The same, viable infant delivered  PROCEDURE: RepeatLow Transverse Cesarean Section  SURGEON:  Dr. Cathyann Cobia   ASSISTANT:  Ebony Goldstein, MD An experienced assistant was required given the standard of surgical care given the complexity of the case.  This assistant was needed for exposure, dissection, suctioning, retraction, instrument exchange, assisting with delivery with administration of fundal pressure, and for overall help during the procedure.  ANESTHESIOLOGY TEAM: Anesthesiologist: Tura Gaines, MD; Lethaniel Rave, MD; Erin Havers, MD  INDICATIONS: Judy Wood is a 28 y.o. [phone] at [redacted]w[redacted]d here for cesarean section secondary to the indications listed under preoperative diagnoses; please see preoperative note for further details.  The risks of surgery were discussed with the patient including but were not limited to: bleeding which may require transfusion or reoperation; infection which may require antibiotics; injury to bowel, bladder, ureters or other surrounding organs; injury to the fetus; need for additional procedures including hysterectomy in the event of a life-threatening hemorrhage; formation of adhesions; placental abnormalities wth subsequent pregnancies; incisional problems; thromboembolic phenomenon and other postoperative/anesthesia complications.  The patient concurred with the proposed plan, giving informed written consent for the procedure.    FINDINGS:  Viable female infant in cephalic presentation.  Apgars 9 and 9.  Amniotic fluid: clear yet female odorous.  Intact placenta, three vessel cord.  Normal uterus, fallopian tubes and ovaries bilaterally. Malodorous amniotic fluid concerning for chorioamnionitis   ANESTHESIA: spinal INTRAVENOUS FLUIDS: 600 ml   ESTIMATED BLOOD LOSS: 980 ml URINE OUTPUT:  100 ml, Clear   SPECIMENS: Placenta sent to pathology . COMPLICATIONS: None immediate  PROCEDURE IN DETAIL:  The patient preoperatively received intravenous antibiotics and had sequential compression devices applied to her lower extremities.  She was then taken to the operating room where spinal anesthesia was found to be adequate. She was then placed in a dorsal supine position with a leftward tilt, and prepped and draped in a sterile manner.  A foley catheter was  placed into her bladder and attached to constant gravity.  After an adequate timeout was performed, a Pfannenstiel skin incision was made with scalpel and carried through to the underlying layer of fascia. The fascia was incised in the midline, and this incision was extended bluntly initially then sharply with mayo scissors. The rectus muscles were separated in the midline and the peritoneum was entered bluntly.   The Alexis self-retaining retractor was introduced into the abdominal cavity.  Attention was turned to the lower uterine segment where a low transverse hysterotomy was made with a scalpel and extended bluntly in caudad and cephalad directions.  The infant was successfully delivered, the cord was clamped and cut after one minute, and the infant was handed over to the awaiting neonatology team. Uterine massage was then administered, and the placenta delivered intact with a three-vessel cord. The uterus was then cleared of clots and debris.  The hysterotomy was closed with 0-Monocryl in a running fashion.  A second imbricating layer was placed with 0- Monocryl in running fashion.  The pelvis was cleared of all clot and debris. Hemostasis was confirmed on all surfaces. The uterus incision was once again inspected and found to be hemostatic. The retractor was removed.  The peritoneum was closed with a 2-0 Vicryl running stitch. The fascia was then closed using 0 Vicryl in a running fashion.  The subcutaneous layer was irrigated, any  areas of bleeding  were cauterized with the bovie,  was reapproximated with 2-0 plain gut in a running fashion, was found to be hemostatic.. . The skin was closed with a 4-0 Vicryl subcuticular stitch. The patient tolerated the procedure well. Sponge, instrument and needle counts were correct x 3.  She was taken to the recovery room in stable condition.   Ebony Goldstein, MD FMOB Fellow, Faculty practice [facility], Center for [facility] 06/26/23  12:56 PM

## 2023-06-26 NOTE — Anesthesia Procedure Notes (Signed)
 Spinal  Patient location during procedure: OR Start time: 06/26/2023 11:30 AM End time: 06/26/2023 11:40 AM Reason for block: surgical anesthesia Staffing Performed: anesthesiologist  Anesthesiologist: Lethaniel Rave, MD Performed by: Lethaniel Rave, MD Authorized by: Lethaniel Rave, MD   Preanesthetic Checklist Completed: patient identified, IV checked, site marked, risks and benefits discussed, surgical consent, monitors and equipment checked, pre-op evaluation and timeout performed Spinal Block Patient position: sitting Prep: DuraPrep Patient monitoring: heart rate, cardiac monitor, continuous pulse ox and blood pressure Approach: midline Location: L4-5 Needle Needle type: Pencan  Needle gauge: 24 G Assessment Sensory level: T6 Additional Notes Functioning IV was confirmed and monitors were applied. Sterile prep and drape, including hand hygiene and sterile gloves were used. The patient was positioned and the spine was prepped. The skin was anesthetized with lidocaine .  Free flow of clear CSF was obtained prior to injecting local anesthetic into the CSF.  The spinal needle aspirated freely following injection.  The needle was carefully withdrawn.  The patient tolerated the procedure well.

## 2023-06-27 ENCOUNTER — Other Ambulatory Visit (HOSPITAL_COMMUNITY): Payer: Self-pay

## 2023-06-27 LAB — COMPREHENSIVE METABOLIC PANEL WITH GFR
ALT: 9 U/L (ref 0–44)
AST: 14 U/L — ABNORMAL LOW (ref 15–41)
Albumin: 2.3 g/dL — ABNORMAL LOW (ref 3.5–5.0)
Alkaline Phosphatase: 62 U/L (ref 38–126)
Anion gap: 8 (ref 5–15)
BUN: 9 mg/dL (ref 6–20)
CO2: 23 mmol/L (ref 22–32)
Calcium: 8.4 mg/dL — ABNORMAL LOW (ref 8.9–10.3)
Chloride: 104 mmol/L (ref 98–111)
Creatinine, Ser: 1 mg/dL (ref 0.44–1.00)
GFR, Estimated: >60 mL/min (ref >60)
Glucose, Bld: 95 mg/dL (ref 70–99)
Potassium: 3.9 mmol/L (ref 3.5–5.1)
Sodium: 135 mmol/L (ref 135–145)
Total Bilirubin: 0.4 mg/dL (ref 0.0–1.2)
Total Protein: 5.6 g/dL — ABNORMAL LOW (ref 6.5–8.1)

## 2023-06-27 LAB — CBC
HCT: 23.7 % — ABNORMAL LOW (ref 36.0–46.0)
Hemoglobin: 8.1 g/dL — ABNORMAL LOW (ref 12.0–15.0)
MCH: 31.9 pg (ref 26.0–34.0)
MCHC: 34.2 g/dL (ref 30.0–36.0)
MCV: 93.3 fL (ref 80.0–100.0)
Platelets: 256 10*3/uL (ref 150–400)
RBC: 2.54 MIL/uL — ABNORMAL LOW (ref 3.87–5.11)
RDW: 13 % (ref 11.5–15.5)
WBC: 14.2 10*3/uL — ABNORMAL HIGH (ref 4.0–10.5)
nRBC: 0 % (ref 0.0–0.2)

## 2023-06-27 LAB — T.PALLIDUM AB, TOTAL: T Pallidum Abs: REACTIVE — AB

## 2023-06-27 MED ORDER — LACTATED RINGERS IV SOLN: INTRAVENOUS | Status: AC

## 2023-06-27 NOTE — Lactation Note (Addendum)
 This note was copied from a baby's chart. Lactation Consultation Note  Patient Name: Judy Wood [id number] Date: 06/27/2023 Age:30 hours Reason for consult: Follow-up assessment;Early term 37-38.6wks  P3, 37 wks, @ 27 hrs of life. Mom receptive to [facility] 2, Mom by choice feeds formula & breast- has not put baby to breast. Admits she's low-key overwhelmed- been a long time since she breast fed a baby- (8 yrs), and she feels unsure about if she wants to put baby to breast. She did try her DEBP last night- did not see milk. Encouraged mom that her DEBP would work better with transitional milk- the breast stimulation would still be good for her supply. Hand pump provided to mom- highlighted better option for colostrum removal. Encouraged mom milk coming in gets faster/easier with each pregnancy. Discussed expectations @ breast- Day 1- sleepy/ feed every 3 hrs/ even 10 minutes is okay, Day 2 more awake/ feeding cues/longer feeds, and cluster feeding overnights brings milk in. Highlighted breast stimulation is tied directly to milk production. Discussed hands on breast and baby, keeping baby awake @ breast. Starting with hand expression & breast compression to get baby working @ breast, and gentle stimulation to keep baby working @ breast. Encouraged EBM for nipple care post feed. LC services and milk storage shared. Encouraged mom to call for assist anytime desired.  Maternal Data Does the patient have breastfeeding experience prior to this delivery?: Yes How long did the patient breastfeed?: 6 months, previous children now 76 & 8 yrs old  Feeding Mother's Current Feeding Choice: Breast Milk and Formula Nipple Type: Slow - flow   Interventions Interventions: Breast feeding basics reviewed;Hand express;Breast compression;Hand pump;DEBP;Education;LC Services brochure;CDC milk storage guidelines  Discharge Pump: DEBP;Manual;Personal  Consult Status Consult Status: Follow-up Date: 06/28/23 Follow-up  type: In-patient    [employer] 06/27/2023, 3:34 PM
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
## 2023-06-27 NOTE — Progress Notes (Signed)
 Post Partum Day 1 Subjective: up ad lib, voiding, tolerating PO, + flatus, and hurts when she walks, but is tolerable  Objective: Blood pressure 124/63, pulse 78, temperature 98 F (36.7 C), temperature source Oral, resp. rate 18, height 5\' 4"  (1.626 m), weight (!) 300 lb 6.4 oz (136.3 kg), SpO2 98%, unknown if currently breastfeeding.  Physical Exam:  General: alert, cooperative, appears stated age, no distress, and moderately obese Lochia: appropriate Uterine Fundus: firm Incision: no significant drainage DVT Evaluation: No evidence of DVT seen on physical exam.  Recent Labs    06/26/23 0920 06/27/23 0619  HGB 11.1* 8.1*  HCT 32.7* 23.7*    Assessment/Plan: Plan for discharge tomorrow or Thursday.   LOS: 3 days   Derick Fleeting, CNM 06/27/2023, 8:15 PM

## 2023-06-28 LAB — SURGICAL PATHOLOGY

## 2023-06-28 MED ORDER — LIDOCAINE HCL 1 % IJ SOLN: 0.0000 mL | Freq: Once | INTRAMUSCULAR | Status: DC | PRN

## 2023-06-28 MED ORDER — ETONOGESTREL 68 MG ~~LOC~~ IMPL: 68.0000 mg | DRUG_IMPLANT | Freq: Once | SUBCUTANEOUS | Status: DC

## 2023-06-28 NOTE — Patient Instructions (Signed)
 Your appointment with Outpatient Lactation is: Date May 19,2025 Time 10:00 am  [facility] (First Floor) [address] [place]  Check in under baby's name.  Please bring your baby hungry along with your pump and a bottle of either formula or expressed breast milk. Please also bring your pump flanges and we welcome support people! If you need lactation assistance before your appointment, please call [phone] and press 4 for lactation.

## 2023-06-28 NOTE — Progress Notes (Signed)
 Subjective: Postpartum Day 2: Cesarean Delivery Patient reports incisional pain with movement, tolerating PO, + flatus, + BM, and no problems voiding.    Objective: Vital signs in last 24 hours: Temp:  [97.7 F (36.5 C)-98 F (36.7 C)] 97.7 F (36.5 C) (04/30 0553) Pulse Rate:  [66-78] 72 (04/30 0553) Resp:  [18] 18 (04/30 0553) BP: (122-124)/(63-73) 122/70 (04/30 0553) SpO2:  [98 %] 98 % (04/30 0553)  Physical Exam:  General: alert, cooperative, and appears stated age Lochia: appropriate Uterine Fundus: firm Incision: Covered by Provena dressing. No drainage noted, seal clean dry and intact.  DVT Evaluation: No evidence of DVT seen on physical exam. No significant calf/ankle edema.  Recent Labs    06/26/23 0920 06/27/23 0619  HGB 11.1* 8.1*  HCT 32.7* 23.7*    Assessment/Plan: Status post Cesarean section. Doing well postoperatively.  Continue current care. Pain currently being managed with PO Oxycodone  Patient would like to transition to Breast feeding after completion of oxy.   Corie Diamond, CNM 06/28/2023, 12:36 PM

## 2023-06-28 NOTE — Lactation Note (Signed)
 This note was copied from a baby's chart. Lactation Consultation Note  Patient Name: Judy Wood [id number] Date: 06/28/2023 Age:30 hours Reason for consult: Follow-up assessment (change in weight loss -3.76 to -3.99%, MOB most been formula feeding but would like to start using the DEBP.) Per MOB, she has not been latching infant at the breast, attempted to use hand pump but nothing was expressed, felt she did not have colostrum to give infant  and due to having breast piercing's was unsure if she wanted to take them out. MOB understands that if she decided to latch infant or pump her breast piercing's would need be remove to prevent danger to infant if latched and danger to herself if using a pump. While LC was in the room, MOB decided to remove piercing, and wanted assistance with how to use the DEBP.   LC was unable to assist with latch at this time due to infant receiving 40 mls of 20 kcal formula. MOB knows to call LC for latch assistance at the next feeding if she would like help with latching infant at the breast. MOB was set up with DEBP and fitted with 21 mm breast flange, MOB was expressing colostrum had expressed 5 mls when pumping, still pumping when LC left the room. MOB was happy to see that she has colostrum and plans to continue to use the DEBP every 3 hours for 15 minutes on initial setting.   MOB plans to latch infant at the next feeding and afterwards give infant her EBM and then formula due to her feeding choice is breast and formula feeding. MOB knows that her EBM is safe for 4 hours at room temperature whereas formula once open is only safe for 1 hour. Handout given " breast milk storage and collection. MOB has handout on " Feeding Guidelines." Maternal Data    Feeding Mother's Current Feeding Choice: Breast Milk and Formula  LATCH Score  NO latch observe at this time.                   Lactation Tools Discussed/Used Tools: Pump;Flanges Flange Size: 21 Breast  pump type: Double-Electric Breast Pump Pump Education: Setup, frequency, and cleaning;Milk Storage Reason for Pumping: MOB would like to offer infant her EBM, still thinking about latching infant at the breast. Pumping frequency: MOB will continue to use the DEBP every 3 hours for 15 minutes on inital settting. Pumped volume: 5 mL (MOB was still expressing colostrum when LC left the room.)  Interventions    Discharge Pump: Personal;DEBP  Consult Status Consult Status: Follow-up Date: 06/29/23 Follow-up type: In-patient    Judy Wood 06/28/2023, 8:07 PM

## 2023-06-29 ENCOUNTER — Ambulatory Visit

## 2023-06-29 ENCOUNTER — Other Ambulatory Visit (HOSPITAL_COMMUNITY): Payer: Self-pay

## 2023-06-29 ENCOUNTER — Encounter: Payer: Self-pay | Admitting: Obstetrics and Gynecology

## 2023-06-29 MED ORDER — POTASSIUM CHLORIDE CRYS ER 20 MEQ PO TBCR
40.0000 meq | EXTENDED_RELEASE_TABLET | Freq: Every day | ORAL | 0 refills | Status: DC
  Filled 2023-06-29: qty 14, 7d supply, fill #0

## 2023-06-29 MED ORDER — OXYCODONE HCL 5 MG PO TABS
5.0000 mg | ORAL_TABLET | Freq: Four times a day (QID) | ORAL | 0 refills | Status: DC | PRN
  Filled 2023-06-29: qty 10, 3d supply, fill #0

## 2023-06-29 MED ORDER — OXYCODONE HCL 5 MG PO TABS: 5.0000 mg | ORAL_TABLET | Freq: Four times a day (QID) | ORAL | 0 refills | Status: DC | PRN

## 2023-06-29 MED ORDER — FUROSEMIDE 20 MG PO TABS
20.0000 mg | ORAL_TABLET | Freq: Once | ORAL | Status: AC
  Administered 2023-06-29: 20 mg via ORAL
  Filled 2023-06-29: qty 1

## 2023-06-29 MED ORDER — FUROSEMIDE 40 MG PO TABS
40.0000 mg | ORAL_TABLET | Freq: Every day | ORAL | 0 refills | Status: DC
  Filled 2023-06-29: qty 6, 6d supply, fill #0

## 2023-06-29 MED ORDER — LABETALOL HCL 300 MG PO TABS
600.0000 mg | ORAL_TABLET | Freq: Three times a day (TID) | ORAL | 0 refills | Status: DC
Start: 2023-06-29 — End: 2023-06-29
  Filled 2023-06-29: qty 150, 25d supply, fill #0

## 2023-06-29 MED ORDER — POTASSIUM CHLORIDE CRYS ER 20 MEQ PO TBCR
40.0000 meq | EXTENDED_RELEASE_TABLET | Freq: Every day | ORAL | 0 refills | Status: DC
Start: 2023-06-30 — End: 2023-08-08
  Filled 2023-06-29: qty 14, 7d supply, fill #0

## 2023-06-29 MED ORDER — OXYCODONE HCL 5 MG PO TABS
5.0000 mg | ORAL_TABLET | Freq: Four times a day (QID) | ORAL | 0 refills | Status: AC | PRN
  Filled 2023-06-29: qty 10, 3d supply, fill #0

## 2023-06-29 MED ORDER — LABETALOL HCL 300 MG PO TABS
600.0000 mg | ORAL_TABLET | Freq: Three times a day (TID) | ORAL | 0 refills | Status: DC
  Filled 2023-06-29: qty 150, 25d supply, fill #0

## 2023-06-29 MED ORDER — NIFEDIPINE ER 90 MG PO TB24
90.0000 mg | ORAL_TABLET | Freq: Two times a day (BID) | ORAL | 0 refills | Status: DC
  Filled 2023-06-29: qty 60, 30d supply, fill #0

## 2023-06-29 MED ORDER — IBUPROFEN 600 MG PO TABS
600.0000 mg | ORAL_TABLET | Freq: Four times a day (QID) | ORAL | 0 refills | Status: AC
  Filled 2023-06-29: qty 40, 10d supply, fill #0

## 2023-06-29 MED ORDER — SENNOSIDES-DOCUSATE SODIUM 8.6-50 MG PO TABS
2.0000 | ORAL_TABLET | Freq: Every day | ORAL | 0 refills | Status: DC
  Filled 2023-06-29: qty 60, 30d supply, fill #0

## 2023-06-29 MED ORDER — FUROSEMIDE 20 MG PO TABS: 40.0000 mg | ORAL_TABLET | Freq: Every day | ORAL | Status: DC

## 2023-06-29 MED ORDER — FUROSEMIDE 40 MG PO TABS
40.0000 mg | ORAL_TABLET | Freq: Every day | ORAL | 0 refills | Status: DC
Start: 2023-06-30 — End: 2023-06-29
  Filled 2023-06-29: qty 6, 6d supply, fill #0

## 2023-06-29 MED ORDER — NIFEDIPINE ER 90 MG PO TB24
90.0000 mg | ORAL_TABLET | Freq: Two times a day (BID) | ORAL | 0 refills | Status: DC
Start: 2023-06-29 — End: 2023-06-29
  Filled 2023-06-29: qty 60, 30d supply, fill #0

## 2023-06-29 NOTE — Lactation Note (Addendum)
 This note was copied from a baby's chart. Lactation Consultation Note  Patient Name: Judy Wood [id number] Date: 06/29/2023 Age:30 hours Reason for consult: Follow-up assessment;Early term 37-38.6wks  P3, Baby [redacted]w[redacted]d.  Mother has pumped 40 ml x 2 and 18 ml. Praised her for her efforts.   Baby demonstrated feeding cues.  Assisted with bottle feeding.  Baby consumed 18 ml of breastmilk using Nfant white standard nipple and became sleepy.  Mother fell asleep during consult while LC was assisting with feeding.  Maternal Data Does the patient have breastfeeding experience prior to this delivery?: Yes  Feeding Mother's Current Feeding Choice: Breast Milk and Formula Nipple Type: Nfant Standard Flow (white)  Lactation Tools Discussed/Used Pumped volume: 40 mL  Interventions Interventions: Education  Discharge Pump: Personal;DEBP  Consult Status Consult Status: Follow-up Date: 06/30/23 Follow-up type: In-patient   Luellen Sages  RN, IBCLC 06/29/2023, 12:22 PM

## 2023-06-30 ENCOUNTER — Other Ambulatory Visit (HOSPITAL_COMMUNITY): Payer: Self-pay

## 2023-07-02 ENCOUNTER — Encounter: Payer: Self-pay | Admitting: Obstetrics and Gynecology

## 2023-07-04 ENCOUNTER — Other Ambulatory Visit (HOSPITAL_COMMUNITY): Payer: Self-pay

## 2023-07-05 ENCOUNTER — Other Ambulatory Visit: Payer: Self-pay | Admitting: Obstetrics and Gynecology

## 2023-07-05 ENCOUNTER — Other Ambulatory Visit (INDEPENDENT_AMBULATORY_CARE_PROVIDER_SITE_OTHER): Payer: Self-pay | Admitting: Obstetrics and Gynecology

## 2023-07-05 ENCOUNTER — Other Ambulatory Visit (INDEPENDENT_AMBULATORY_CARE_PROVIDER_SITE_OTHER): Payer: Self-pay | Admitting: Advanced Practice Midwife

## 2023-07-05 ENCOUNTER — Other Ambulatory Visit: Payer: Self-pay | Admitting: Family Medicine

## 2023-07-05 ENCOUNTER — Other Ambulatory Visit: Payer: Self-pay

## 2023-07-05 ENCOUNTER — Ambulatory Visit: Admitting: *Deleted

## 2023-07-05 VITALS — BP 142/92 | HR 85 | Ht 64.0 in | Wt 282.8 lb

## 2023-07-05 DIAGNOSIS — O10919 Unspecified pre-existing hypertension complicating pregnancy, unspecified trimester: Secondary | ICD-10-CM

## 2023-07-05 DIAGNOSIS — Z0130 Encounter for examination of blood pressure without abnormal findings: Secondary | ICD-10-CM

## 2023-07-05 DIAGNOSIS — Z4889 Encounter for other specified surgical aftercare: Secondary | ICD-10-CM

## 2023-07-05 NOTE — Progress Notes (Signed)
 Pt presents for BP check and incision check following repeat C/S on 4/28. She reports she has been having headaches, dizziness and pain on Lt side of neck as well as her spine. Last occurrence of these symptoms was 2 days ago. Pt is concerned these symptoms are related to having epidural. Wound vac was removed and incision was found to be healing well. No redness, swelling or drainage was noted. Several small areas where skin was not completely closed were observed. BP today = 139/92, P = 87, re-check 142/92, P = 85. Dr. Willey Harrier informed of BP values and pt c/o of pain and dizziness. He also examined incision. Pt was advised that incision is healing well. She was instructed on proper wound cleansing and care. No changes to blood pressure medications were made. Pt was advised to go to [facility] if her Sx of headache, neck pain or dizziness worsen. She voiced understanding of all information and instructions given.

## 2023-07-06 NOTE — BH Specialist Note (Signed)
 Pt did not arrive to video visit and did not answer the phone; Left HIPPA-compliant message to call back Carolyn Cisco from [employer] for [employer] at [facility] at  [phone] [facility] office).  ?; left MyChart message for patient.  ? ?

## 2023-07-12 DIAGNOSIS — F4310 Post-traumatic stress disorder, unspecified: Secondary | ICD-10-CM | POA: Diagnosis not present

## 2023-07-12 DIAGNOSIS — S82892S Other fracture of left lower leg, sequela: Secondary | ICD-10-CM | POA: Diagnosis not present

## 2023-07-12 DIAGNOSIS — S82831A Other fracture of upper and lower end of right fibula, initial encounter for closed fracture: Secondary | ICD-10-CM | POA: Diagnosis not present

## 2023-07-12 DIAGNOSIS — S92112B Displaced fracture of neck of left talus, initial encounter for open fracture: Secondary | ICD-10-CM | POA: Diagnosis not present

## 2023-07-13 ENCOUNTER — Encounter: Payer: Self-pay | Admitting: Obstetrics and Gynecology

## 2023-07-20 ENCOUNTER — Ambulatory Visit: Payer: Self-pay | Admitting: Clinical

## 2023-07-20 DIAGNOSIS — Z91199 Patient's noncompliance with other medical treatment and regimen due to unspecified reason: Secondary | ICD-10-CM

## 2023-07-27 ENCOUNTER — Ambulatory Visit: Payer: Self-pay | Admitting: Clinical

## 2023-07-27 DIAGNOSIS — F4310 Post-traumatic stress disorder, unspecified: Secondary | ICD-10-CM | POA: Diagnosis not present
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
## 2023-07-28 ENCOUNTER — Other Ambulatory Visit: Payer: Self-pay | Admitting: Obstetrics and Gynecology

## 2023-07-28 MED ORDER — SERTRALINE HCL 50 MG PO TABS: 50.0000 mg | ORAL_TABLET | Freq: Every day | ORAL | 6 refills | Status: AC

## 2023-07-31 ENCOUNTER — Ambulatory Visit: Admitting: Obstetrics and Gynecology

## 2023-07-31 ENCOUNTER — Ambulatory Visit: Admitting: Family Medicine

## 2023-07-31 NOTE — Patient Instructions (Signed)
Center for [facility] Healthcare at [facility] [address] [place], [place] [address] [phone] (main office) [phone] [facility] office)  [facility]  [address] [place], [place] [address] [phone] [email or web address]  [facility]:  [address], 2nd floor, [place], [place] [address] [phone])  Main line [phone]  [place] location **Parking is available in the [address] parking deck.  [place]:  [address] [place], [place] [address] [phone]) Main line [phone]  [place] location  **Located on the backside of the [facility] [place]. Limited parking is available off of [address]  [place] hours: Monday -Friday 8:30am-4:30pm  [email or web address]    If immediate emergency, call 9-1-1, or [facility] [place] 24 hour crisis hotline at: (719)240-0127  [facility] [address], [place], [place] [address] [phone] **temporary housing for victims of domestic violence  [place]:  [facility]  [address] [place], [place] [address] [phone]  [email or web address]  Monday-Friday, 8:30am-5:00pm

## 2023-08-07 ENCOUNTER — Ambulatory Visit: Admitting: Obstetrics and Gynecology

## 2023-08-07 ENCOUNTER — Other Ambulatory Visit: Payer: Self-pay

## 2023-08-07 DIAGNOSIS — Z3202 Encounter for pregnancy test, result negative: Secondary | ICD-10-CM

## 2023-08-07 DIAGNOSIS — I10 Essential (primary) hypertension: Secondary | ICD-10-CM

## 2023-08-07 DIAGNOSIS — Z30017 Encounter for initial prescription of implantable subdermal contraceptive: Secondary | ICD-10-CM | POA: Diagnosis not present

## 2023-08-07 DIAGNOSIS — F53 Postpartum depression: Secondary | ICD-10-CM

## 2023-08-07 LAB — POCT URINALYSIS DIP (DEVICE)
Glucose, UA: NEGATIVE mg/dL
Ketones, ur: NEGATIVE mg/dL
Leukocytes,Ua: NEGATIVE
Nitrite: NEGATIVE
Protein, ur: 100 mg/dL — AB
Specific Gravity, Urine: >=1.03 (ref 1.005–1.030)
Urobilinogen, UA: 0.2 mg/dL (ref 0.0–1.0)
pH: 6 (ref 5.0–8.0)

## 2023-08-07 LAB — POCT PREGNANCY, URINE: Preg Test, Ur: NEGATIVE

## 2023-08-07 MED ORDER — ETONOGESTREL 68 MG ~~LOC~~ IMPL
68.0000 mg | DRUG_IMPLANT | Freq: Once | SUBCUTANEOUS | Status: AC
  Administered 2023-08-07: 68 mg via SUBCUTANEOUS

## 2023-08-07 NOTE — Progress Notes (Unsigned)
 Post Partum Visit Note  Judy Wood is a 28 y.o. G8P3003 female who presents for a postpartum visit. She is 6 weeks postpartum following a repeat cesarean section.  I have fully reviewed the prenatal and intrapartum course. The delivery was at 37.3 gestational weeks.  Anesthesia: spinal. Postpartum course has been hard, lots of social stressors. Baby is doing well. Baby is feeding by bottle - Similac Soy. Bleeding red, moderate. Bowel function is normal. Bladder function is normal. Patient is sexually active. Contraception method is possibly Nexplanon , but not decided for sure. Postpartum depression screening: positive.    The pregnancy intention screening data noted above was reviewed. Potential methods of contraception were discussed. The patient elected to proceed with No data recorded.   Edinburgh Postnatal Depression Scale - 08/07/23 1103       Edinburgh Postnatal Depression Scale:  In the Past 7 Days   I have been able to laugh and see the funny side of things. 2    I have looked forward with enjoyment to things. 2    I have blamed myself unnecessarily when things went wrong. 3    I have been anxious or worried for no good reason. 3    I have felt scared or panicky for no good reason. 2    Things have been getting on top of me. 3    I have been so unhappy that I have had difficulty sleeping. 3    I have felt sad or miserable. 3    I have been so unhappy that I have been crying. 3    The thought of harming myself has occurred to me. 2    Edinburgh Postnatal Depression Scale Total 26             Health Maintenance Due  Topic Date Due   Pneumococcal Vaccine 55-9 Years old (2 of 2 - PCV) 10/15/2015   COVID-19 Vaccine (1 - 2024-25 season) Never done    The following portions of the patient's history were reviewed and updated as appropriate: allergies, current medications, past family history, past medical history, past social history, past surgical history, and problem  list.  Review of Systems Pertinent items are noted in HPI.  Objective:  BP (!) 162/102   Wt 272 lb (123.4 kg)   LMP  (LMP Unknown)   BMI 46.69 kg/m    General:  alert and cooperative   Breasts:  not indicated  Lungs: Normal effort     Abdomen: soft   Wound well approximated incision  GU exam:  not indicated   Nexplanon  Insertion Procedure Patient identified, informed consent performed, consent signed.   Patient does understand that irregular bleeding is a very common side effect of this medication. She was advised to have backup contraception for one week after placement. Pregnancy test in clinic today was negative.  Appropriate time out taken.    Patient's left arm was prepped and draped in the usual sterile fashion. The ruler used to measure and mark insertion area.  Patient was prepped with alcohol swab and then injected with 3 ml of 1% lidocaine .  She was prepped with betadine, Nexplanon  removed from packaging,  Device confirmed in needle, then inserted full length of needle and withdrawn per handbook instructions. Nexplanon  was able to palpated in the patient's arm; patient palpated the insert herself. There was minimal blood loss.  Patient insertion site covered with guaze and a pressure bandage to reduce any bruising.    The  patient tolerated the procedure well and was given post procedure instructions.   Assessment:   1. Postpartum exam (Primary) Nexplanon  inserted today   2. Chronic hypertension Has not taken bp meds today, just got a phone call in office that added to stress. Denies HA, visual disturbance, plan to take medication and call back with BP result.  Will also follow up in office later this week Discussed with MD   3. Postpartum depression Significant life stressors, she is meeting with [facility]), denies SI/HI today, denies plan, discussed 24/hr behavioral health urgent care if that changes. She just started zoloft  yesterday. \ Will plan follow up with me     Plan:   Essential components of care per ACOG recommendations:  1.  Mood and well being: Patient with positive depression screening today. Reviewed local resources for support.  - Patient tobacco use? No.     2. Infant care and feeding:  -Patient currently breastmilk feeding? No.  -Social determinants of health (SDOH) reviewed in EPIC.   3. Sexuality, contraception and birth spacing - Patient does not want a pregnancy in the next year.   - Reviewed reproductive life planning. Reviewed contraceptive methods based on pt preferences and effectiveness.  Patient desired Hormonal Implant today.   - Discussed birth spacing of 18 months  4. Sleep and fatigue -Encouraged family/partner/community support of 4 hrs of uninterrupted sleep to help with mood and fatigue  5. Physical Recovery  - Discussed patients delivery and complications. She describes her labor as good. - Patient had a C-section. Patient had a none laceration. Perineal healing reviewed. Patient expressed understanding - Patient has urinary incontinence? No. - Patient is safe to resume physical and sexual activity  6.  Health Maintenance - HM due items addressed Yes - Last pap smear  Diagnosis  Date Value Ref Range Status  12/21/2022   Final   - Negative for intraepithelial lesion or malignancy (NILM)   Pap smear not done at today's visit.  -Breast Cancer screening indicated? No.   7. Chronic Disease/Pregnancy Condition follow up: Hypertension  - PCP follow up   Susi Eric, FNP [facility], [facility]

## 2023-08-15 ENCOUNTER — Ambulatory Visit

## 2023-08-16 ENCOUNTER — Ambulatory Visit: Admitting: Clinical

## 2023-08-16 DIAGNOSIS — Z91199 Patient's noncompliance with other medical treatment and regimen due to unspecified reason: Secondary | ICD-10-CM

## 2023-08-16 NOTE — BH Specialist Note (Signed)
 Pt did not arrive to video visit and did not answer the phone; Unable to leave message by phone; left MyChart message for patient.

## 2023-08-22 ENCOUNTER — Ambulatory Visit: Admitting: Obstetrics and Gynecology

## 2023-09-15 ENCOUNTER — Ambulatory Visit: Admitting: Family Medicine

## 2023-10-16 ENCOUNTER — Other Ambulatory Visit: Payer: Self-pay

## 2023-10-16 ENCOUNTER — Ambulatory Visit: Admitting: Obstetrics and Gynecology

## 2023-10-16 VITALS — BP 176/99 | HR 80 | Wt 284.5 lb

## 2023-10-16 DIAGNOSIS — R635 Abnormal weight gain: Secondary | ICD-10-CM | POA: Diagnosis not present

## 2023-10-16 DIAGNOSIS — Z30011 Encounter for initial prescription of contraceptive pills: Secondary | ICD-10-CM

## 2023-10-16 DIAGNOSIS — I10 Essential (primary) hypertension: Secondary | ICD-10-CM

## 2023-10-16 DIAGNOSIS — Z3046 Encounter for surveillance of implantable subdermal contraceptive: Secondary | ICD-10-CM

## 2023-10-16 MED ORDER — AMLODIPINE BESYLATE 5 MG PO TABS: 5.0000 mg | ORAL_TABLET | Freq: Every day | ORAL | 11 refills | Status: AC

## 2023-10-16 MED ORDER — SLYND 4 MG PO TABS: 1.0000 | ORAL_TABLET | Freq: Every day | ORAL | 4 refills | Status: AC

## 2023-10-16 NOTE — Progress Notes (Signed)
   GYNECOLOGY PROGRESS NOTE  History:  28 y.o. G3P3003 presents to [facility] for follow up mood and BP. Not take blood pressure medication, having hard time with timing and multiple meds. Reports she was on amlodipine  prior to pregnancy. She denies visual disturbances, headache, chest pain or shortness of breath.   She is still bleeding with nexplanon  that was placed 08/07/23, desires other option   The following portions of the patient's history were reviewed and updated as appropriate: allergies, current medications, past family history, past medical history, past social history, past surgical history and problem list. Last pap smear on 12/21/22 was normal, neg HRHPV.  Health Maintenance Due  Topic Date Due   Hepatitis B Vaccines 19-59 Average Risk (1 of 3 - 19+ 3-dose series) Never done   Pneumococcal Vaccine (2 of 2 - PCV) 10/15/2015   HPV VACCINES (1 - 3-dose SCDM series) Never done   COVID-19 Vaccine (1 - 2024-25 season) Never done   INFLUENZA VACCINE  09/29/2023     Review of Systems:  Pertinent items are noted in HPI.   Objective:  Physical Exam Blood pressure (!) 176/99, pulse 80, weight 284 lb 8 oz (129 kg), unknown if currently breastfeeding. VS reviewed, nursing note reviewed,  Constitutional: well developed, well nourished, no distress HEENT: normocephalic CV: normal rate Pulm/chest wall: normal effort Abdomen: soft Neuro: alert and oriented  Skin: warm, dry Psych: affect normal Pelvic exam: deferred  GYNECOLOGY OFFICE PROCEDURE NOTE  Nexplanon  Removal Patient identified, informed consent performed, consent signed.   Appropriate time out taken.   Nexplanon  site identified.  Area prepped in usual sterile fashon. Two ml of 1% lidocaine  was used to anesthetize the area at the distal end of the implant. A small stab incision was made right beside the implant on the distal portion.  The Nexplanon  rod was grasped using hemostats and removed without difficulty.  There was  minimal blood loss. There were no complications.  Steri-strips were applied over the small incision.  A pressure bandage was applied to reduce any bruising.    The patient tolerated the procedure well and was given post procedure instructions.  Patient is planning to use POPs for contraception.     Assessment & Plan:  1. Chronic hypertension (Primary) Start norvasc  today, follow up in one week BP check  Discussed importance of compliance  - amLODipine  (NORVASC ) 5 MG tablet; Take 1 tablet (5 mg total) by mouth daily.  Dispense: 30 tablet; Refill: 11  2. Weight gain Reports trouble losing since delivery, desires referral to nutrition today - Amb Referral to Nutrition and Diabetic Education  3. Encounter for initial prescription of contraceptive pills Discussed options for birth control to include POPs, IUD, injection, continue implant. She desires pops, discussed importance of taking regularly and not missing doses, discussed missed pill window  - Drospirenone  (SLYND ) 4 MG TABS; Take 1 tablet (4 mg total) by mouth daily.  Dispense: 84 tablet; Refill: 4  4. Nexplanon  removal   Return in about 1 week (around 10/23/2023), or reschedule with jamie, for Nurse visit BP check.  Future Appointments  Date Time Provider Department Center  10/24/2023  2:30 PM [facility] NURSE [facility]  10/26/2023  9:45 AM [facility] HEALTH CLINICIAN [facility]     Nidia Daring, FNP

## 2023-10-24 ENCOUNTER — Ambulatory Visit

## 2023-10-24 NOTE — Progress Notes (Deleted)
 Blood Pressure Check Visit  Judy Wood is here for blood pressure check following {RLDELIVERYTYPE:31632} on ***. BP today is ***. Patient denies/endorses any {Symptoms; hypertension cc:11129:s:dizzness,blurred vision,headache,shortness of breath,peripheral edema}.  Cyndee JAYSON Molt, RN 10/24/2023  8:33 AM

## 2023-10-25 NOTE — BH Specialist Note (Unsigned)
 Pt did not arrive to video visit and did not answer the phone; Left HIPPA-compliant message to call back Warren from [employer] for [employer] at [facility] at  [phone] [facility] office).  ; left MyChart message for patient.

## 2023-10-26 ENCOUNTER — Ambulatory Visit: Admitting: Clinical

## 2023-10-26 DIAGNOSIS — Z91199 Patient's noncompliance with other medical treatment and regimen due to unspecified reason: Secondary | ICD-10-CM

## 2024-01-22 ENCOUNTER — Ambulatory Visit: Admitting: Family Medicine

## 2024-02-06 ENCOUNTER — Telehealth

## 2024-02-07 NOTE — Progress Notes (Signed)
°  Because of having low back pain with a  vaginal discharge and trying to conceive, I feel your condition warrants further evaluation and I recommend that you be seen in a face-to-face visit for formal testing and to rule in/out pregnancy.   NOTE: There will be NO CHARGE for this E-Visit   If you are having a true medical emergency, please call 911.     For an urgent face to face visit, [place] has multiple urgent care centers for your convenience.  Click the link below for the full list of locations and hours, walk-in wait times, appointment scheduling options and driving directions:  Urgent Care - [place], [place], [place], [place], [place], [place]  [place]     Your MyChart E-visit questionnaire answers were reviewed by a board certified advanced clinical practitioner to complete your personal care plan based on your specific symptoms.    Thank you for using e-Visits.

## 2024-03-13 ENCOUNTER — Telehealth

## 2024-03-13 ENCOUNTER — Telehealth: Admitting: Physician Assistant

## 2024-03-13 ENCOUNTER — Encounter: Payer: Self-pay | Admitting: Obstetrics & Gynecology

## 2024-03-13 DIAGNOSIS — N926 Irregular menstruation, unspecified: Secondary | ICD-10-CM

## 2024-03-13 NOTE — Patient Instructions (Signed)
 " Judy Wood, thank you for joining Delon CHRISTELLA Dickinson, PA-C for today's virtual visit.  While this provider is not your primary care provider (PCP), if your PCP is located in our provider database this encounter information will be shared with them immediately following your visit.   A [facility] MyChart account gives you access to today's visit and all your visits, tests, and labs performed at [facility]  click here if you don't have a [facility] MyChart account or go to [email or web address]  Consent: (Patient) Judy JONETTA Paulus provided verbal consent for this virtual visit at the beginning of the encounter.  Current Medications:  Current Outpatient Medications:    acetaminophen  (TYLENOL ) 500 MG tablet, Take 1,000 mg by mouth every 8 (eight) hours as needed for headache. (Patient not taking: Reported on 10/16/2023), Disp: , Rfl:    albuterol  (VENTOLIN  HFA) 108 (90 Base) MCG/ACT inhaler, Inhale 1-2 puffs into the lungs every 6 (six) hours as needed for wheezing or shortness of breath. (Patient not taking: Reported on 10/16/2023), Disp: 6.7 g, Rfl: 0   amLODipine  (NORVASC ) 5 MG tablet, Take 1 tablet (5 mg total) by mouth daily., Disp: 30 tablet, Rfl: 11   Blood Pressure Monitoring (BLOOD PRESSURE KIT) DEVI, 1 Device by Does not apply route daily. (Patient not taking: Reported on 10/16/2023), Disp: 1 each, Rfl: 0   cyclobenzaprine  (FLEXERIL ) 10 MG tablet, Take 1 tablet (10 mg total) by mouth every 8 (eight) hours as needed for muscle spasms., Disp: 30 tablet, Rfl: 1   Drospirenone  (SLYND ) 4 MG TABS, Take 1 tablet (4 mg total) by mouth daily., Disp: 84 tablet, Rfl: 4   emtricitabine -tenofovir  (TRUVADA) 200-300 MG tablet, Take 1 tablet by mouth daily. (Patient not taking: Reported on 10/16/2023), Disp: 90 tablet, Rfl: 3   hydrOXYzine  (ATARAX ) 25 MG tablet, Take 1 tablet (25 mg total) by mouth every 6 (six) hours as needed for anxiety., Disp: 30 tablet, Rfl: 0   ibuprofen  (ADVIL ) 600  MG tablet, Take 1 tablet (600 mg total) by mouth every 6 (six) hours. (Patient not taking: Reported on 10/16/2023), Disp: 40 tablet, Rfl: 0   oxyCODONE  (OXY IR/ROXICODONE ) 5 MG immediate release tablet, Take 1 tablet (5 mg total) by mouth every 6 (six) hours as needed for severe pain (pain score 7-10) or breakthrough pain. (Patient not taking: Reported on 10/16/2023), Disp: 10 tablet, Rfl: 0   Prenatal 28-0.8 MG TABS, Take 1 tablet by mouth daily. (Patient not taking: Reported on 10/16/2023), Disp: 30 tablet, Rfl: 12   sertraline  (ZOLOFT ) 50 MG tablet, Starting 05/14/23, Take 1 tablet (50 mg total) by mouth daily., Disp: 30 tablet, Rfl: 6   Medications ordered in this encounter:  No orders of the defined types were placed in this encounter.    *If you need refills on other medications prior to your next appointment, please contact your pharmacy*  Follow-Up: Call back or seek an in-person evaluation if the symptoms worsen or if the condition fails to improve as anticipated.  [facility] [phone]  Other Instructions Absent Menstrual Periods (Primary Amenorrhea): What to Know  Amenorrhea is when you don't have a menstrual period. The two types of amenorrhea are primary and secondary. Primary amenorrhea is diagnosed if any of these things happen: You've not had your first period by 31 years old but have signs of puberty, such as breast development. You've not had your first period within 5 years after breast development that started before you were 31  years old. You've not had your first period or shown any signs of puberty by 31 years old. What are the causes? Delayed puberty is the most common cause of not having a period. However, there are many possible causes. In many cases, treating the underlying problem may cause periods to begin. Not having periods may be caused by: Problems with the reproductive system, such as: The vagina, uterus, or ovaries not being normal. A  problem called polycystic ovary syndrome. Ovaries that don't work well. This may be caused by: Chemotherapy, which uses medicines to kill cancer cells. Radiation therapy. Problems with your genes or chromosomes. Autoimmune disease. Some medicines Drug use. Endocrine system problems, such as: Cushing syndrome. Thyroid  disease. Diabetes, especially if it's not well-controlled. A tumor in the pituitary gland or hypothalamus in the brain. Other causes may include: Not having enough healthy foods to eat or having an eating disorder. Being very obese. Low body fat or a lot of weight loss. Extreme athletic training. What are the signs or symptoms? The main symptom is not having a period by 31 years old. How is this diagnosed? Primary amenorrhea may be diagnosed based on: Your medical history. A physical exam. Your provider may look for common symptoms such as: Signs of puberty. Headaches. Hair on your chest and face. Acne. Discharge from your nipples. An exam of the areas between your hip bones (pelvis) to check for problems with your reproductive organs. A measurement of your body mass index (BMI). Checking if you have headaches, problems seeing, or problems with your sense of smell. You may also have tests, including: An ultrasound of your pelvis. An MRI of your head. Blood tests that measure certain hormones. Hormones are chemicals that affect how the body works. How is this treated? Treatment depends on the cause. If there are normal signs of puberty, but at an older age than usual, no treatment is usually needed. If not having periods is due to a cause other than delayed puberty, treatment may include: Lifestyle changes. Surgery. Medicine. Follow these instructions at home:  Eat a healthy diet. In general, a healthy diet includes lots of fruits and vegetables, low-fat dairy products, lean meats, and foods that have fiber in them. Ask to meet with an expert in healthy  eating called a dietitian, if needed. Stay at a healthy weight. Talk to your health care provider before trying any new diet or exercise plan Get regular exercise. Aim for 30 minutes of activity 5 times a week. Ask your provider which exercises are safe for you. Take your medicines only as told. Contact a health care provider if: Your periods don't start after you get treatment. You have pain in your pelvis. You have a big change in your weight. This information is not intended to replace advice given to you by your health care provider. Make sure you discuss any questions you have with your health care provider. Document Revised: 12/02/2022 Document Reviewed: 12/02/2022 Elsevier Patient Education  [employer].   If you have been instructed to have an in-person evaluation today at a local Urgent Care facility, please use the link below. It will take you to a list of all of our available [facility], including address, phone number and hours of operation. Please do not delay care.  [facility]  If you or a family member do not have a primary care provider, use the link below to schedule a visit and establish care. When you choose a  [facility] primary care physician or advanced practice provider, you gain a long-term partner in health. Find a Primary Care Provider  Learn more about [facility]'s in-office and virtual care options: [place] - Get Care Now "
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
## 2024-04-04 ENCOUNTER — Ambulatory Visit: Admitting: Obstetrics & Gynecology
